# Patient Record
Sex: Female | Born: 1960 | Race: Black or African American | Hispanic: No | Smoking: Never smoker
Health system: Southern US, Community
[De-identification: ages and names within clinical notes are randomized; demographics above are authoritative.]

## PROBLEM LIST (undated history)

## (undated) DIAGNOSIS — M1990 Unspecified osteoarthritis, unspecified site: Secondary | ICD-10-CM

## (undated) DIAGNOSIS — E119 Type 2 diabetes mellitus without complications: Secondary | ICD-10-CM

## (undated) DIAGNOSIS — I519 Heart disease, unspecified: Secondary | ICD-10-CM

## (undated) DIAGNOSIS — G4730 Sleep apnea, unspecified: Secondary | ICD-10-CM

## (undated) DIAGNOSIS — D219 Benign neoplasm of connective and other soft tissue, unspecified: Secondary | ICD-10-CM

## (undated) DIAGNOSIS — I10 Essential (primary) hypertension: Secondary | ICD-10-CM

## (undated) HISTORY — DX: Essential (primary) hypertension: I10

## (undated) HISTORY — DX: Heart disease, unspecified: I51.9

## (undated) HISTORY — DX: Type 2 diabetes mellitus without complications: E11.9

---

## 2000-12-23 ENCOUNTER — Emergency Department (HOSPITAL_COMMUNITY): Admission: EM | Admit: 2000-12-23 | Discharge: 2000-12-23 | Payer: Self-pay | Admitting: Emergency Medicine

## 2003-01-13 ENCOUNTER — Encounter
Admission: RE | Admit: 2003-01-13 | Discharge: 2003-01-13 | Payer: Self-pay | Admitting: Physical Medicine and Rehabilitation

## 2004-09-16 ENCOUNTER — Other Ambulatory Visit: Admission: RE | Admit: 2004-09-16 | Discharge: 2004-09-16 | Payer: Self-pay | Admitting: Obstetrics and Gynecology

## 2004-09-24 ENCOUNTER — Ambulatory Visit (HOSPITAL_COMMUNITY): Admission: RE | Admit: 2004-09-24 | Discharge: 2004-09-24 | Payer: Self-pay | Admitting: Obstetrics and Gynecology

## 2006-11-23 ENCOUNTER — Emergency Department (HOSPITAL_COMMUNITY): Admission: EM | Admit: 2006-11-23 | Discharge: 2006-11-23 | Payer: Self-pay | Admitting: Emergency Medicine

## 2010-02-22 ENCOUNTER — Encounter: Payer: Self-pay | Admitting: Obstetrics and Gynecology

## 2013-07-26 ENCOUNTER — Emergency Department (INDEPENDENT_AMBULATORY_CARE_PROVIDER_SITE_OTHER)
Admission: EM | Admit: 2013-07-26 | Discharge: 2013-07-26 | Disposition: A | Discharge status: Home / Self Care | Payer: BC Managed Care – PPO | Source: Home / Self Care | Attending: Family Medicine | Admitting: Family Medicine

## 2013-07-26 ENCOUNTER — Encounter (HOSPITAL_COMMUNITY): Payer: Self-pay | Admitting: Emergency Medicine

## 2013-07-26 DIAGNOSIS — A084 Viral intestinal infection, unspecified: Secondary | ICD-10-CM

## 2013-07-26 DIAGNOSIS — A088 Other specified intestinal infections: Secondary | ICD-10-CM

## 2013-07-26 MED ORDER — ONDANSETRON HCL 8 MG PO TABS: 8.0000 mg | ORAL_TABLET | Freq: Three times a day (TID) | ORAL | Status: DC | PRN

## 2013-07-26 MED ORDER — ONDANSETRON 4 MG PO TBDP
8.0000 mg | ORAL_TABLET | Freq: Once | ORAL | Status: AC
  Administered 2013-07-26: 8 mg via ORAL

## 2013-07-26 MED ORDER — ONDANSETRON 4 MG PO TBDP
ORAL_TABLET | ORAL | Status: AC
  Filled 2013-07-26: qty 2

## 2013-07-26 NOTE — ED Provider Notes (Signed)
Danielle Mullins is a 53 y.o. female who presents to Urgent Care today for nausea vomiting diarrhea present for one day. Diarrhea is improving. No blood in the stool or vomiting. No abdominal pain. No fevers chills chest pains or palpitations. No medications tried yet.   History reviewed. No pertinent past medical history. History  Substance Use Topics  . Smoking status: Not on file  . Smokeless tobacco: Not on file  . Alcohol Use: Not on file   ROS as above Medications: No current facility-administered medications for this encounter.   Current Outpatient Prescriptions  Medication Sig Dispense Refill  . ondansetron (ZOFRAN) 8 MG tablet Take 1 tablet (8 mg total) by mouth every 8 (eight) hours as needed for nausea or vomiting.  20 tablet  0    Exam:  BP 124/84  Pulse 78  Temp(Src) 98.5 F (36.9 C) (Oral)  Resp 18  SpO2 99% Gen: Well NAD HEENT: EOMI,  MMM Lungs: Normal work of breathing. CTABL Heart: RRR no MRG Abd: NABS, Soft. NT, ND no rebound or guarding Exts: Brisk capillary refill, warm and well perfused.   Patient was given 8 mg of oral Zofran and felt better  No results found for this or any previous visit (from the past 24 hour(s)). No results found.  Assessment and Plan: 54 y.o. female with viral gastroenteritis. Plan to treat with Zofran. Watchful waiting. Followup as needed.  Discussed warning signs or symptoms. Please see discharge instructions. Patient expresses understanding.    Gregor Hams, MD 07/26/13 2101

## 2013-07-26 NOTE — Discharge Instructions (Signed)
Thank you for coming in today. If your belly pain worsens, or you have high fever, bad vomiting, blood in your stool or black tarry stool go to the Emergency Room.   Viral Gastroenteritis Viral gastroenteritis is also known as stomach flu. This condition affects the stomach and intestinal tract. It can cause sudden diarrhea and vomiting. The illness typically lasts 3 to 8 days. Most people develop an immune response that eventually gets rid of the virus. While this natural response develops, the virus can make you quite ill. CAUSES  Many different viruses can cause gastroenteritis, such as rotavirus or noroviruses. You can catch one of these viruses by consuming contaminated food or water. You may also catch a virus by sharing utensils or other personal items with an infected person or by touching a contaminated surface. SYMPTOMS  The most common symptoms are diarrhea and vomiting. These problems can cause a severe loss of body fluids (dehydration) and a body salt (electrolyte) imbalance. Other symptoms may include:  Fever.  Headache.  Fatigue.  Abdominal pain. DIAGNOSIS  Your caregiver can usually diagnose viral gastroenteritis based on your symptoms and a physical exam. A stool sample may also be taken to test for the presence of viruses or other infections. TREATMENT  This illness typically goes away on its own. Treatments are aimed at rehydration. The most serious cases of viral gastroenteritis involve vomiting so severely that you are not able to keep fluids down. In these cases, fluids must be given through an intravenous line (IV). HOME CARE INSTRUCTIONS   Drink enough fluids to keep your urine clear or pale yellow. Drink small amounts of fluids frequently and increase the amounts as tolerated.  Ask your caregiver for specific rehydration instructions.  Avoid:  Foods high in sugar.  Alcohol.  Carbonated drinks.  Tobacco.  Juice.  Caffeine drinks.  Extremely hot or cold  fluids.  Fatty, greasy foods.  Too much intake of anything at one time.  Dairy products until 24 to 48 hours after diarrhea stops.  You may consume probiotics. Probiotics are active cultures of beneficial bacteria. They may lessen the amount and number of diarrheal stools in adults. Probiotics can be found in yogurt with active cultures and in supplements.  Wash your hands well to avoid spreading the virus.  Only take over-the-counter or prescription medicines for pain, discomfort, or fever as directed by your caregiver. Do not give aspirin to children. Antidiarrheal medicines are not recommended.  Ask your caregiver if you should continue to take your regular prescribed and over-the-counter medicines.  Keep all follow-up appointments as directed by your caregiver. SEEK IMMEDIATE MEDICAL CARE IF:   You are unable to keep fluids down.  You do not urinate at least once every 6 to 8 hours.  You develop shortness of breath.  You notice blood in your stool or vomit. This may look like coffee grounds.  You have abdominal pain that increases or is concentrated in one small area (localized).  You have persistent vomiting or diarrhea.  You have a fever.  The patient is a child younger than 3 months, and he or she has a fever.  The patient is a child older than 3 months, and he or she has a fever and persistent symptoms.  The patient is a child older than 3 months, and he or she has a fever and symptoms suddenly get worse.  The patient is a baby, and he or she has no tears when crying. MAKE SURE YOU:     Understand these instructions.  Will watch your condition.  Will get help right away if you are not doing well or get worse. Document Released: 01/18/2005 Document Revised: 04/12/2011 Document Reviewed: 11/04/2010 Exit[employer] Patient Information 2015 [employer]. This information is not intended to replace advice given to you by your health care provider. Make sure you discuss  any questions you have with your health care provider.  

## 2013-07-26 NOTE — ED Notes (Signed)
C/o vomiting and diarrhea which started Wednesday 2 am States has vomit 5 x today

## 2015-01-07 ENCOUNTER — Ambulatory Visit (INDEPENDENT_AMBULATORY_CARE_PROVIDER_SITE_OTHER): Payer: BLUE CROSS/BLUE SHIELD | Admitting: Family Medicine

## 2015-01-07 VITALS — BP 148/86 | HR 72 | Temp 98.9°F | Resp 16 | Ht 64.5 in | Wt 251.6 lb

## 2015-01-07 DIAGNOSIS — M62838 Other muscle spasm: Secondary | ICD-10-CM

## 2015-01-07 DIAGNOSIS — M6248 Contracture of muscle, other site: Secondary | ICD-10-CM | POA: Diagnosis not present

## 2015-01-07 MED ORDER — CYCLOBENZAPRINE HCL 5 MG PO TABS: ORAL_TABLET | ORAL | Status: DC
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
## 2015-01-07 NOTE — Patient Instructions (Signed)
You likely have a pinched nerve that has caused some muscle spasm in your neck and upper shoulder area.  Heat or ice to affected area as needed. Thermacare heat wrap as needed during your workday as needed.  Range of motion/gentle stretches during the day (including at work). Cyclobenzaprine (muscle relaxant) up to 3 times per day as needed, but start at bedtime as this can cause sedation. Over the counter ibuprofen or naproxen if needed short term. If not improving in next 10-14 days - return for recheck and likely xrays. Return to the clinic or go to the nearest emergency room if any of your symptoms worsen or new symptoms occur.  Muscle Cramps and Spasms Muscle cramps and spasms occur when a muscle or muscles tighten and you have no control over this tightening (involuntary muscle contraction). They are a common problem and can develop in any muscle. The most common place is in the calf muscles of the leg. Both muscle cramps and muscle spasms are involuntary muscle contractions, but they also have differences:   Muscle cramps are sporadic and painful. They may last a few seconds to a quarter of an hour. Muscle cramps are often more forceful and last longer than muscle spasms.  Muscle spasms may or may not be painful. They may also last just a few seconds or much longer. CAUSES  It is uncommon for cramps or spasms to be due to a serious underlying problem. In many cases, the cause of cramps or spasms is unknown. Some common causes are:   Overexertion.   Overuse from repetitive motions (doing the same thing over and over).   Remaining in a certain position for a long period of time.   Improper preparation, form, or technique while performing a sport or activity.   Dehydration.   Injury.   Side effects of some medicines.   Abnormally low levels of the salts and ions in your blood (electrolytes), especially potassium and calcium. This could happen if you are taking water pills  (diuretics) or you are pregnant.  Some underlying medical problems can make it more likely to develop cramps or spasms. These include, but are not limited to:   Diabetes.   Parkinson disease.   Hormone disorders, such as thyroid problems.   Alcohol abuse.   Diseases specific to muscles, joints, and bones.   Blood vessel disease where not enough blood is getting to the muscles.  HOME CARE INSTRUCTIONS   Stay well hydrated. Drink enough water and fluids to keep your urine clear or pale yellow.  It may be helpful to massage, stretch, and relax the affected muscle.  For tight or tense muscles, use a warm towel, heating pad, or hot shower water directed to the affected area.  If you are sore or have pain after a cramp or spasm, applying ice to the affected area may relieve discomfort.  Put ice in a plastic bag.  Place a towel between your skin and the bag.  Leave the ice on for 15-20 minutes, 03-04 times a day.  Medicines used to treat a known cause of cramps or spasms may help reduce their frequency or severity. Only take over-the-counter or prescription medicines as directed by your caregiver. SEEK MEDICAL CARE IF:  Your cramps or spasms get more severe, more frequent, or do not improve over time.  MAKE SURE YOU:   Understand these instructions.  Will watch your condition.  Will get help right away if you are not doing well or get  worse.   This information is not intended to replace advice given to you by your health care provider. Make sure you discuss any questions you have with your health care provider.   Document Released: 07/10/2001 Document Revised: 05/15/2012 Document Reviewed: 01/05/2012 Elsevier Interactive Patient Education [employer].

## 2015-01-13 ENCOUNTER — Encounter: Payer: Self-pay | Admitting: Podiatry

## 2015-01-13 ENCOUNTER — Ambulatory Visit (INDEPENDENT_AMBULATORY_CARE_PROVIDER_SITE_OTHER): Payer: BLUE CROSS/BLUE SHIELD | Admitting: Podiatry

## 2015-01-13 ENCOUNTER — Ambulatory Visit (INDEPENDENT_AMBULATORY_CARE_PROVIDER_SITE_OTHER): Payer: BLUE CROSS/BLUE SHIELD

## 2015-01-13 DIAGNOSIS — M2580 Other specified joint disorders, unspecified joint: Secondary | ICD-10-CM | POA: Diagnosis not present

## 2015-01-13 DIAGNOSIS — R52 Pain, unspecified: Secondary | ICD-10-CM

## 2015-01-13 DIAGNOSIS — Q828 Other specified congenital malformations of skin: Secondary | ICD-10-CM

## 2015-01-13 DIAGNOSIS — L600 Ingrowing nail: Secondary | ICD-10-CM

## 2015-01-13 DIAGNOSIS — M722 Plantar fascial fibromatosis: Secondary | ICD-10-CM

## 2015-01-13 HISTORY — DX: Other specified joint disorders, unspecified joint: M25.80

## 2015-01-13 HISTORY — DX: Other specified congenital malformations of skin: Q82.8

## 2015-01-13 HISTORY — DX: Plantar fascial fibromatosis: M72.2

## 2015-01-13 HISTORY — DX: Ingrowing nail: L60.0

## 2015-01-13 MED ORDER — DICLOFENAC SODIUM 75 MG PO TBEC: 75.0000 mg | DELAYED_RELEASE_TABLET | Freq: Two times a day (BID) | ORAL | Status: DC

## 2015-01-13 NOTE — Progress Notes (Signed)
Subjective:    Patient ID: Danielle Mullins, female    DOB: 1960/03/13, 54 y.o.   MRN: [id number]  HPI  54 year old female presents the office with multiple concerns. She states that when she first any blemishes with the appointment for burning to her feet that has resolved. She states that she has been having pain to the ball the right foot for which she points to submetatarsal one which has been ongoing for approximate 4 months. She denies any recent injury or trauma. Denies any swelling or redness over the area. She denies any numbness or tingling. She also states that intimately she does get pain to her left heel but she is going on heavy pain at this time. She has been doing heel after standing for prolonged periods of time. She has has concern of an ingrown toenail of the left big toe as the nail starting to grow inward which become painful at times. Denies any surrounding redness or drainage. Lastly she has a callus of the bottom of her right midfoot which is been there for several years but did not cause any pain, swelling, redness or drainage. She is diabetic and states her blood sugars typically run between 83 with 119. Denies any numbness or tingling. No history of ulceration. No claudication symptoms. No other complaints at this time. She has been taking approximately seems to help.   Review of Systems  All other systems reviewed and are negative.      Objective:   Physical Exam General: AAO x3, NAD  Dermatological: Skin is warm, dry and supple bilateral. There is evidence of incurvation along both the medial lateral aspect of the left hallux toenail in the toenail appears to be somewhat thickened discolored. There is no tenderness palpation of the nails insertion on erythema or drainage. On the right plantar medial midfoot there does appear to be a small hyperkeratotic lesion. Upon debridement the area is superficial, annular and appear to be a porokeratosis. There are no open sores,  no preulcerative lesions, no rash or signs of infection present.  Vascular: Dorsalis Pedis artery and Posterior Tibial artery pedal pulses are 2/4 bilateral with immedate capillary fill time. Pedal hair growth present. No varicosities and no lower extremity edema present bilateral. There is no pain with calf compression, swelling, warmth, erythema.   Neruologic: Grossly intact via light touch bilateral. Vibratory intact via tuning fork bilateral. Protective threshold with Semmes Wienstein monofilament intact to all pedal sites bilateral. Patellar and Achilles deep tendon reflexes 2+ bilateral. No Babinski or clonus noted bilateral.   Musculoskeletal: There is atrophy of the fat pad within the ball the foot and there is prominent metatarsal heads plantarly. At this time there is no tenderness to palpation on the plantar medial tubercle of the calcaneus or the insertion of the plantar fascial bilaterally and there is no pain lateral compression of the calcaneus. The patella course as insertion of the Achilles tendon. There is tenderness up his on submetatarsal one on the right foot however there is no pain the vibratory sensation. There is mpj range of motion. There is no amount edema, erythema, increase in warmth. No other areas of tenderness to bilateral lower extremities. Gait: Unassisted, Nonantalgic.      Assessment & Plan:  54 year old female with right foot sesamoiditis, likely left plantar fasciitis, left ingrown toenail, right porokeratosis -Treatment options discussed including all alternatives, risks, and complications -X-rays were obtained and reviewed with the patient.  -Etiology of symptoms were discussed -In regards  to the plantar fasciitis discussed stretching exercises, shoe gear modifications, icing, naproxen to continue with. Discussed steroid injection but she wishes to hold off. -For the sesamoiditis on the right foot submetatarsal one pain discussed with her steroid injection but  wishes to hold off. Offloading pads were dispensed. I discussed the orthotics and shoe gear changes. -Ingrown toenails debrided without, complications/bleeding. Discussed over-the-counter fungi nail to help with toenail fungus. Continue to monitor for any reoccurrence. Monitor for signs or symptoms of infection. If symptoms persist many partial nail avulsion. -Porokeratosis debrided without, complications/bleeding. -Follow-up in 6 weeks or sooner if any problems arise. In the meantime, encouraged to call the office with any questions, concerns, change in symptoms.   Celesta Gentile, DPM

## 2015-01-13 NOTE — Patient Instructions (Addendum)
Achilles Tendinitis  with Rehab Achilles tendinitis is a disorder of the Achilles tendon. The Achilles tendon connects the large calf muscles (Gastrocnemius and Soleus) to the heel bone (calcaneus). This tendon is sometimes called the heel cord. It is important for pushing-off and standing on your toes and is important for walking, running, or jumping. Tendinitis is often caused by overuse and repetitive microtrauma. SYMPTOMS  Pain, tenderness, swelling, warmth, and redness may occur over the Achilles tendon even at rest.  Pain with pushing off, or flexing or extending the ankle.  Pain that is worsened after or during activity. CAUSES   Overuse sometimes seen with rapid increase in exercise programs or in sports requiring running and jumping.  Poor physical conditioning (strength and flexibility or endurance).  Running sports, especially training running down hills.  Inadequate warm-up before practice or play or failure to stretch before participation.  Injury to the tendon. PREVENTION   Warm up and stretch before practice or competition.  Allow time for adequate rest and recovery between practices and competition.  Keep up conditioning.  Keep up ankle and leg flexibility.  Improve or keep muscle strength and endurance.  Improve cardiovascular fitness.  Use proper technique.  Use proper equipment (shoes, skates).  To help prevent recurrence, taping, protective strapping, or an adhesive bandage may be recommended for several weeks after healing is complete. PROGNOSIS   Recovery may take weeks to several months to heal.  Longer recovery is expected if symptoms have been prolonged.  Recovery is usually quicker if the inflammation is due to a direct blow as compared with overuse or sudden strain. RELATED COMPLICATIONS   Healing time will be prolonged if the condition is not correctly treated. The injury must be given plenty of time to heal.  Symptoms can reoccur if  activity is resumed too soon.  Untreated, tendinitis may increase the risk of tendon rupture requiring additional time for recovery and possibly surgery. TREATMENT   The first treatment consists of rest anti-inflammatory medication, and ice to relieve the pain.  Stretching and strengthening exercises after resolution of pain will likely help reduce the risk of recurrence. Referral to a physical therapist or athletic trainer for further evaluation and treatment may be helpful.  A walking boot or cast may be recommended to rest the Achilles tendon. This can help break the cycle of inflammation and microtrauma.  Arch supports (orthotics) may be prescribed or recommended by your caregiver as an adjunct to therapy and rest.  Surgery to remove the inflamed tendon lining or degenerated tendon tissue is rarely necessary and has shown less than predictable results. MEDICATION   Nonsteroidal anti-inflammatory medications, such as aspirin and ibuprofen, may be used for pain and inflammation relief. Do not take within 7 days before surgery. Take these as directed by your caregiver. Contact your caregiver immediately if any bleeding, stomach upset, or signs of allergic reaction occur. Other minor pain relievers, such as acetaminophen, may also be used.  Pain relievers may be prescribed as necessary by your caregiver. Do not take prescription pain medication for longer than 4 to 7 days. Use only as directed and only as much as you need.  Cortisone injections are rarely indicated. Cortisone injections may weaken tendons and predispose to rupture. It is better to give the condition more time to heal than to use them. HEAT AND COLD  Cold is used to relieve pain and reduce inflammation for acute and chronic Achilles tendinitis. Cold should be applied for 10 to 15 minutes   every 2 to 3 hours for inflammation and pain and immediately after any activity that aggravates your symptoms. Use ice packs or an ice  massage.  Heat may be used before performing stretching and strengthening activities prescribed by your caregiver. Use a heat pack or a warm soak. SEEK MEDICAL CARE IF:  Symptoms get worse or do not improve in 2 weeks despite treatment.  New, unexplained symptoms develop. Drugs used in treatment may produce side effects.  EXERCISES:  RANGE OF MOTION (ROM) AND STRETCHING EXERCISES - Achilles Tendinitis  These exercises may help you when beginning to rehabilitate your injury. Your symptoms may resolve with or without further involvement from your physician, physical therapist or athletic trainer. While completing these exercises, remember:   Restoring tissue flexibility helps normal motion to return to the joints. This allows healthier, less painful movement and activity.  An effective stretch should be held for at least 30 seconds.  A stretch should never be painful. You should only feel a gentle lengthening or release in the stretched tissue.  STRETCH  Gastroc, Standing   Place hands on wall.  Extend right / left leg, keeping the front knee somewhat bent.  Slightly point your toes inward on your back foot.  Keeping your right / left heel on the floor and your knee straight, shift your weight toward the wall, not allowing your back to arch.  You should feel a gentle stretch in the right / left calf. Hold this position for 10 seconds. Repeat 3 times. Complete this stretch 2 times per day.  STRETCH  Soleus, Standing   Place hands on wall.  Extend right / left leg, keeping the other knee somewhat bent.  Slightly point your toes inward on your back foot.  Keep your right / left heel on the floor, bend your back knee, and slightly shift your weight over the back leg so that you feel a gentle stretch deep in your back calf.  Hold this position for 10 seconds. Repeat 3 times. Complete this stretch 2 times per day.  STRETCH  Gastrocsoleus, Standing  Note: This exercise can place  a lot of stress on your foot and ankle. Please complete this exercise only if specifically instructed by your caregiver.   Place the ball of your right / left foot on a step, keeping your other foot firmly on the same step.  Hold on to the wall or a rail for balance.  Slowly lift your other foot, allowing your body weight to press your heel down over the edge of the step.  You should feel a stretch in your right / left calf.  Hold this position for 10 seconds.  Repeat this exercise with a slight bend in your knee. Repeat 3 times. Complete this stretch 2 times per day.   STRENGTHENING EXERCISES - Achilles Tendinitis These exercises may help you when beginning to rehabilitate your injury. They may resolve your symptoms with or without further involvement from your physician, physical therapist or athletic trainer. While completing these exercises, remember:   Muscles can gain both the endurance and the strength needed for everyday activities through controlled exercises.  Complete these exercises as instructed by your physician, physical therapist or athletic trainer. Progress the resistance and repetitions only as guided.  You may experience muscle soreness or fatigue, but the pain or discomfort you are trying to eliminate should never worsen during these exercises. If this pain does worsen, stop and make certain you are following the directions exactly. If   the pain is still present after adjustments, discontinue the exercise until you can discuss the trouble with your clinician.  STRENGTH - Plantar-flexors   Sit with your right / left leg extended. Holding onto both ends of a rubber exercise band/tubing, loop it around the ball of your foot. Keep a slight tension in the band.  Slowly push your toes away from you, pointing them downward.  Hold this position for 10 seconds. Return slowly, controlling the tension in the band/tubing. Repeat 3 times. Complete this exercise 2 times per day.     STRENGTH - Plantar-flexors   Stand with your feet shoulder width apart. Steady yourself with a wall or table using as little support as needed.  Keeping your weight evenly spread over the width of your feet, rise up on your toes.*  Hold this position for 10 seconds. Repeat 3 times. Complete this exercise 2 times per day.  *If this is too easy, shift your weight toward your right / left leg until you feel challenged. Ultimately, you may be asked to do this exercise with your right / left foot only.  STRENGTH  Plantar-flexors, Eccentric  Note: This exercise can place a lot of stress on your foot and ankle. Please complete this exercise only if specifically instructed by your caregiver.   Place the balls of your feet on a step. With your hands, use only enough support from a wall or rail to keep your balance.  Keep your knees straight and rise up on your toes.  Slowly shift your weight entirely to your right / left toes and pick up your opposite foot. Gently and with controlled movement, lower your weight through your right / left foot so that your heel drops below the level of the step. You will feel a slight stretch in the back of your calf at the end position.  Use the healthy leg to help rise up onto the balls of both feet, then lower weight only on the right / left leg again. Build up to 15 repetitions. Then progress to 3 consecutive sets of 15 repetitions.*  After completing the above exercise, complete the same exercise with a slight knee bend (about 30 degrees). Again, build up to 15 repetitions. Then progress to 3 consecutive sets of 15 repetitions.* Perform this exercise 2 times per day.  *When you easily complete 3 sets of 15, your physician, physical therapist or athletic trainer may advise you to add resistance by wearing a backpack filled with additional weight.  STRENGTH - Plantar Flexors, Seated   Sit on a chair that allows your feet to rest flat on the ground. If  necessary, sit at the edge of the chair.  Keeping your toes firmly on the ground, lift your right / left heel as far as you can without increasing any discomfort in your ankle. Repeat 3 times. Complete this exercise 2 times a day.    Plantar Fasciitis (Heel Spur Syndrome) with Rehab The plantar fascia is a fibrous, ligament-like, soft-tissue structure that spans the bottom of the foot. Plantar fasciitis is a condition that causes pain in the foot due to inflammation of the tissue. SYMPTOMS   Pain and tenderness on the underneath side of the foot.  Pain that worsens with standing or walking. CAUSES  Plantar fasciitis is caused by irritation and injury to the plantar fascia on the underneath side of the foot. Common mechanisms of injury include:  Direct trauma to bottom of the foot.  Damage to a small   nerve that runs under the foot where the main fascia attaches to the heel bone.  Stress placed on the plantar fascia due to bone spurs. RISK INCREASES WITH:   Activities that place stress on the plantar fascia (running, jumping, pivoting, or cutting).  Poor strength and flexibility.  Improperly fitted shoes.  Tight calf muscles.  Flat feet.  Failure to warm-up properly before activity.  Obesity. PREVENTION  Warm up and stretch properly before activity.  Allow for adequate recovery between workouts.  Maintain physical fitness:  Strength, flexibility, and endurance.  Cardiovascular fitness.  Maintain a health body weight.  Avoid stress on the plantar fascia.  Wear properly fitted shoes, including arch supports for individuals who have flat feet.  PROGNOSIS  If treated properly, then the symptoms of plantar fasciitis usually resolve without surgery. However, occasionally surgery is necessary.  RELATED COMPLICATIONS   Recurrent symptoms that may result in a chronic condition.  Problems of the lower back that are caused by compensating for the injury, such as  limping.  Pain or weakness of the foot during push-off following surgery.  Chronic inflammation, scarring, and partial or complete fascia tear, occurring more often from repeated injections.  TREATMENT  Treatment initially involves the use of ice and medication to help reduce pain and inflammation. The use of strengthening and stretching exercises may help reduce pain with activity, especially stretches of the Achilles tendon. These exercises may be performed at home or with a therapist. Your caregiver may recommend that you use heel cups of arch supports to help reduce stress on the plantar fascia. Occasionally, corticosteroid injections are given to reduce inflammation. If symptoms persist for greater than 6 months despite non-surgical (conservative), then surgery may be recommended.   MEDICATION   If pain medication is necessary, then nonsteroidal anti-inflammatory medications, such as aspirin and ibuprofen, or other minor pain relievers, such as acetaminophen, are often recommended.  Do not take pain medication within 7 days before surgery.  Prescription pain relievers may be given if deemed necessary by your caregiver. Use only as directed and only as much as you need.  Corticosteroid injections may be given by your caregiver. These injections should be reserved for the most serious cases, because they may only be given a certain number of times.  HEAT AND COLD  Cold treatment (icing) relieves pain and reduces inflammation. Cold treatment should be applied for 10 to 15 minutes every 2 to 3 hours for inflammation and pain and immediately after any activity that aggravates your symptoms. Use ice packs or massage the area with a piece of ice (ice massage).  Heat treatment may be used prior to performing the stretching and strengthening activities prescribed by your caregiver, physical therapist, or athletic trainer. Use a heat pack or soak the injury in warm water.  SEEK IMMEDIATE MEDICAL  CARE IF:  Treatment seems to offer no benefit, or the condition worsens.  Any medications produce adverse side effects.  EXERCISES- RANGE OF MOTION (ROM) AND STRETCHING EXERCISES - Plantar Fasciitis (Heel Spur Syndrome) These exercises may help you when beginning to rehabilitate your injury. Your symptoms may resolve with or without further involvement from your physician, physical therapist or athletic trainer. While completing these exercises, remember:   Restoring tissue flexibility helps normal motion to return to the joints. This allows healthier, less painful movement and activity.  An effective stretch should be held for at least 30 seconds.  A stretch should never be painful. You should only feel a gentle lengthening   or release in the stretched tissue.  RANGE OF MOTION - Toe Extension, Flexion  Sit with your right / left leg crossed over your opposite knee.  Grasp your toes and gently pull them back toward the top of your foot. You should feel a stretch on the bottom of your toes and/or foot.  Hold this stretch for 10 seconds.  Now, gently pull your toes toward the bottom of your foot. You should feel a stretch on the top of your toes and or foot.  Hold this stretch for 10 seconds. Repeat  times. Complete this stretch 3 times per day.   RANGE OF MOTION - Ankle Dorsiflexion, Active Assisted  Remove shoes and sit on a chair that is preferably not on a carpeted surface.  Place right / left foot under knee. Extend your opposite leg for support.  Keeping your heel down, slide your right / left foot back toward the chair until you feel a stretch at your ankle or calf. If you do not feel a stretch, slide your bottom forward to the edge of the chair, while still keeping your heel down.  Hold this stretch for 10 seconds. Repeat 3 times. Complete this stretch 2 times per day.   STRETCH  Gastroc, Standing  Place hands on wall.  Extend right / left leg, keeping the front knee  somewhat bent.  Slightly point your toes inward on your back foot.  Keeping your right / left heel on the floor and your knee straight, shift your weight toward the wall, not allowing your back to arch.  You should feel a gentle stretch in the right / left calf. Hold this position for 10 seconds. Repeat 3 times. Complete this stretch 2 times per day.  STRETCH  Soleus, Standing  Place hands on wall.  Extend right / left leg, keeping the other knee somewhat bent.  Slightly point your toes inward on your back foot.  Keep your right / left heel on the floor, bend your back knee, and slightly shift your weight over the back leg so that you feel a gentle stretch deep in your back calf.  Hold this position for 10 seconds. Repeat 3 times. Complete this stretch 2 times per day.  STRETCH  Gastrocsoleus, Standing  Note: This exercise can place a lot of stress on your foot and ankle. Please complete this exercise only if specifically instructed by your caregiver.   Place the ball of your right / left foot on a step, keeping your other foot firmly on the same step.  Hold on to the wall or a rail for balance.  Slowly lift your other foot, allowing your body weight to press your heel down over the edge of the step.  You should feel a stretch in your right / left calf.  Hold this position for 10 seconds.  Repeat this exercise with a slight bend in your right / left knee. Repeat 3 times. Complete this stretch 2 times per day.   STRENGTHENING EXERCISES - Plantar Fasciitis (Heel Spur Syndrome)  These exercises may help you when beginning to rehabilitate your injury. They may resolve your symptoms with or without further involvement from your physician, physical therapist or athletic trainer. While completing these exercises, remember:   Muscles can gain both the endurance and the strength needed for everyday activities through controlled exercises.  Complete these exercises as instructed by  your physician, physical therapist or athletic trainer. Progress the resistance and repetitions only as guided.  STRENGTH -   Towel Curls  Sit in a chair positioned on a non-carpeted surface.  Place your foot on a towel, keeping your heel on the floor.  Pull the towel toward your heel by only curling your toes. Keep your heel on the floor. Repeat 3 times. Complete this exercise 2 times per day.  STRENGTH - Ankle Inversion  Secure one end of a rubber exercise band/tubing to a fixed object (table, pole). Loop the other end around your foot just before your toes.  Place your fists between your knees. This will focus your strengthening at your ankle.  Slowly, pull your big toe up and in, making sure the band/tubing is positioned to resist the entire motion.  Hold this position for 10 seconds.  Have your muscles resist the band/tubing as it slowly pulls your foot back to the starting position. Repeat 3 times. Complete this exercises 2 times per day.  Document Released: 01/18/2005 Document Revised: 04/12/2011 Document Reviewed: 05/02/2008 [employer] Patient Information 2014 [employer]  

## 2015-04-08 ENCOUNTER — Other Ambulatory Visit: Payer: Self-pay | Admitting: Obstetrics

## 2015-04-08 ENCOUNTER — Ambulatory Visit (INDEPENDENT_AMBULATORY_CARE_PROVIDER_SITE_OTHER): Payer: BLUE CROSS/BLUE SHIELD | Admitting: Obstetrics

## 2015-04-08 ENCOUNTER — Encounter: Payer: Self-pay | Admitting: Obstetrics

## 2015-04-08 VITALS — BP 118/70 | HR 59 | Temp 98.1°F | Wt 241.0 lb

## 2015-04-08 DIAGNOSIS — Z01419 Encounter for gynecological examination (general) (routine) without abnormal findings: Secondary | ICD-10-CM | POA: Diagnosis not present

## 2015-04-08 DIAGNOSIS — D259 Leiomyoma of uterus, unspecified: Secondary | ICD-10-CM

## 2015-04-08 DIAGNOSIS — Z780 Asymptomatic menopausal state: Secondary | ICD-10-CM

## 2015-04-08 DIAGNOSIS — Z1239 Encounter for other screening for malignant neoplasm of breast: Secondary | ICD-10-CM

## 2015-04-08 DIAGNOSIS — Z1231 Encounter for screening mammogram for malignant neoplasm of breast: Secondary | ICD-10-CM

## 2015-04-08 NOTE — Progress Notes (Signed)
Subjective:        Danielle Mullins is a 55 y.o. female here for a routine exam.  Current complaints: none.    Personal health questionnaire:  Is patient Ashkenazi Jewish, have a family history of breast and/or ovarian cancer: no Is there a family history of uterine cancer diagnosed at age < 24, gastrointestinal cancer, urinary tract cancer, family member who is a Field seismologist syndrome-associated carrier: no Is the patient overweight and hypertensive, family history of diabetes, personal history of gestational diabetes, preeclampsia or PCOS: yes Is patient over 22, have PCOS,  family history of premature CHD under age 27, diabetes, smoke, have hypertension or peripheral artery disease:  no At any time, has a partner hit, kicked or otherwise hurt or frightened you?: no Over the past 2 weeks, have you felt down, depressed or hopeless?: no Over the past 2 weeks, have you felt little interest or pleasure in doing things?:no   Gynecologic History No LMP recorded. Patient is postmenopausal.  LMP ~5 years ago. Contraception: post menopausal status Last Pap: ~10 years ago. Results were: normal Last mammogram: ~10 years ago. Results were: normal  Obstetric History OB History  No data available    Past Medical History  Diagnosis Date  . Diabetes mellitus without complication ([place])   . Hypertension     History reviewed. No pertinent past surgical history.   Current outpatient prescriptions:  .  diclofenac (VOLTAREN) 75 MG EC tablet, Take 1 tablet (75 mg total) by mouth 2 (two) times daily., Disp: 30 tablet, Rfl: 2 .  lisinopril (PRINIVIL,ZESTRIL) 10 MG tablet, Take 10 mg by mouth daily., Disp: , Rfl:  .  metFORMIN (GLUCOPHAGE) 500 MG tablet, Take by mouth 3 (three) times daily with meals., Disp: , Rfl:  No Known Allergies  Social History  Substance Use Topics  . Smoking status: Never Smoker   . Smokeless tobacco: Never Used  . Alcohol Use: No    Family History  Problem Relation Age of  Onset  . Diabetes Mother   . Hypertension Mother   . Heart disease Father   . Hypertension Father   . Diabetes Brother       Review of Systems  Constitutional: negative for fatigue and weight loss Respiratory: negative for cough and wheezing Cardiovascular: negative for chest pain, fatigue and palpitations Gastrointestinal: negative for abdominal pain and change in bowel habits Musculoskeletal:negative for myalgias Neurological: negative for gait problems and tremors Behavioral/Psych: negative for abusive relationship, depression Endocrine: negative for temperature intolerance   Genitourinary:negative for abnormal menstrual periods, genital lesions, hot flashes, sexual problems and vaginal discharge Integument/breast: negative for breast lump, breast tenderness, nipple discharge and skin lesion(s)    Objective:       BP 118/70 mmHg  Pulse 59  Temp(Src) 98.1 F (36.7 C)  Wt 241 lb (109.317 kg) General:   alert  Skin:   no rash or abnormalities  Lungs:   clear to auscultation bilaterally  Heart:   regular rate and rhythm, S1, S2 normal, no murmur, click, rub or gallop  Breasts:   normal without suspicious masses, skin or nipple changes or axillary nodes  Abdomen:  normal findings: no organomegaly, soft, non-tender and no hernia  Pelvis:  External genitalia: normal general appearance Urinary system: urethral meatus normal and bladder without fullness, nontender Vaginal: normal without tenderness, induration or masses.  Mucosa atrophic and bled with speculum manipulation Cervix: normal appearance.  Retroflexed.  Difficult to get optimal assessment of anatomy due to retroflexion  Adnexa: difficult assessment due to obesity Uterus: difficult assessment due to obesity   Lab Review Urine pregnancy test Labs reviewed yes Radiologic studies reviewed yes    Assessment:    Healthy female exam.   Difficult exam technically due to obesity.    H/O uterine fibroids  Morbid  obesity  Type 2 Diabetes, on Metformin, stable  Postmenopausal.  Clinically stable.  Hypertension.  Stable, on Lisinopril.   Plan:    Ultrasound ordered  Mammogram ordered  Bone Density Study ordered  F/U in 2 weeks  No orders of the defined types were placed in this encounter.   Orders Placed This Encounter  Procedures  . US Transvaginal Non-OB    Standing Status: Future     Number of Occurrences:      Standing Expiration Date: 06/07/2016    Order Specific Question:  Reason for Exam (SYMPTOM  OR DIAGNOSIS REQUIRED)    Answer:  postmenopausal.  H/O fibroids.    Order Specific Question:  Preferred imaging location?    Answer:  [facility]  . US Pelvis Complete    Standing Status: Future     Number of Occurrences:      Standing Expiration Date: 06/07/2016    Order Specific Question:  Reason for Exam (SYMPTOM  OR DIAGNOSIS REQUIRED)    Answer:  postmenopause.  H/O fibroids.    Order Specific Question:  Preferred imaging location?    Answer:  [facility]  . MM Digital Screening    Standing Status: Future     Number of Occurrences:      Standing Expiration Date: 06/07/2016    Scheduling Instructions:     Postmenopausal    Order Specific Question:  Reason for Exam (SYMPTOM  OR DIAGNOSIS REQUIRED)    Answer:  screening    Order Specific Question:  Is the patient pregnant?    Answer:  No    Order Specific Question:  Preferred imaging location?    Answer:  [facility]  . DG Bone Density    Standing Status: Future     Number of Occurrences:      Standing Expiration Date: 06/07/2016    Order Specific Question:  Reason for Exam (SYMPTOM  OR DIAGNOSIS REQUIRED)    Answer:  estrogen deficiency    Order Specific Question:  Is the patient pregnant?    Answer:  No    Order Specific Question:  Preferred imaging location?    Answer:  [id number]

## 2015-04-10 LAB — PAP, TP IMAGING W/ HPV RNA, RFLX HPV TYPE 16,18/45: HPV mRNA, High Risk: NOT DETECTED

## 2015-04-11 LAB — SURESWAB, VAGINOSIS/VAGINITIS PLUS
Atopobium vaginae: NOT DETECTED Log (cells/mL)
C. albicans, DNA: NOT DETECTED
C. glabrata, DNA: NOT DETECTED
C. parapsilosis, DNA: NOT DETECTED
C. trachomatis RNA, TMA: NOT DETECTED
C. tropicalis, DNA: NOT DETECTED
Gardnerella vaginalis: NOT DETECTED Log (cells/mL)
LACTOBACILLUS SPECIES: NOT DETECTED Log (cells/mL)
MEGASPHAERA SPECIES: NOT DETECTED Log (cells/mL)
N. gonorrhoeae RNA, TMA: NOT DETECTED
T. vaginalis RNA, QL TMA: DETECTED — AB

## 2015-04-12 ENCOUNTER — Other Ambulatory Visit: Payer: Self-pay | Admitting: Obstetrics

## 2015-04-12 DIAGNOSIS — A5901 Trichomonal vulvovaginitis: Secondary | ICD-10-CM

## 2015-04-12 MED ORDER — TINIDAZOLE 500 MG PO TABS: 2.0000 g | ORAL_TABLET | Freq: Every day | ORAL | Status: DC

## 2015-04-15 ENCOUNTER — Ambulatory Visit (HOSPITAL_COMMUNITY): Payer: Self-pay

## 2015-04-18 ENCOUNTER — Ambulatory Visit (HOSPITAL_COMMUNITY)
Admission: RE | Admit: 2015-04-18 | Discharge: 2015-04-18 | Disposition: A | Discharge status: Home / Self Care | Payer: BLUE CROSS/BLUE SHIELD | Source: Ambulatory Visit | Attending: Obstetrics | Admitting: Obstetrics

## 2015-04-18 DIAGNOSIS — D252 Subserosal leiomyoma of uterus: Secondary | ICD-10-CM | POA: Diagnosis not present

## 2015-04-18 DIAGNOSIS — D251 Intramural leiomyoma of uterus: Secondary | ICD-10-CM | POA: Insufficient documentation

## 2015-04-18 DIAGNOSIS — D259 Leiomyoma of uterus, unspecified: Secondary | ICD-10-CM

## 2015-04-22 ENCOUNTER — Ambulatory Visit: Payer: BLUE CROSS/BLUE SHIELD | Admitting: Obstetrics

## 2015-05-02 ENCOUNTER — Ambulatory Visit
Admission: RE | Admit: 2015-05-02 | Discharge: 2015-05-02 | Disposition: A | Discharge status: Home / Self Care | Payer: BLUE CROSS/BLUE SHIELD | Source: Ambulatory Visit | Attending: Obstetrics | Admitting: Obstetrics

## 2015-05-02 DIAGNOSIS — Z780 Asymptomatic menopausal state: Secondary | ICD-10-CM

## 2015-05-02 DIAGNOSIS — Z1231 Encounter for screening mammogram for malignant neoplasm of breast: Secondary | ICD-10-CM

## 2015-05-13 ENCOUNTER — Ambulatory Visit (INDEPENDENT_AMBULATORY_CARE_PROVIDER_SITE_OTHER): Payer: BLUE CROSS/BLUE SHIELD | Admitting: Obstetrics

## 2015-05-13 VITALS — BP 139/80 | HR 71 | Wt 243.0 lb

## 2015-05-13 DIAGNOSIS — N760 Acute vaginitis: Secondary | ICD-10-CM | POA: Diagnosis not present

## 2015-05-13 DIAGNOSIS — B9689 Other specified bacterial agents as the cause of diseases classified elsewhere: Secondary | ICD-10-CM

## 2015-05-13 DIAGNOSIS — D259 Leiomyoma of uterus, unspecified: Secondary | ICD-10-CM

## 2015-05-13 DIAGNOSIS — A499 Bacterial infection, unspecified: Secondary | ICD-10-CM

## 2015-05-13 DIAGNOSIS — R87615 Unsatisfactory cytologic smear of cervix: Secondary | ICD-10-CM | POA: Diagnosis not present

## 2015-05-13 DIAGNOSIS — A5901 Trichomonal vulvovaginitis: Secondary | ICD-10-CM | POA: Diagnosis not present

## 2015-05-15 ENCOUNTER — Encounter: Payer: Self-pay | Admitting: Obstetrics

## 2015-05-15 NOTE — Progress Notes (Signed)
Consult for results:  H/O uterine fibroids. S:  No complaints. O:  Afebrile  VSS       PE:  Deferred        Ultrasound:  Numerous uterine fibroids: otherwise, normal.              Pap Smear:  Unsatisfactory specimen.  No transformation zone cells.  A:  Stable uterine fibroids        Trichomonas vaginitis       Unsatisfactory pap smear P:  F/U for repeat pap 3 months after treatment for Trichomonas.  May need to do under anesthesia because of patient's inability to tolerate exam.  Clenton Pare. Jodi Mourning MD 05-13-15

## 2015-08-12 ENCOUNTER — Ambulatory Visit (INDEPENDENT_AMBULATORY_CARE_PROVIDER_SITE_OTHER): Payer: BLUE CROSS/BLUE SHIELD | Admitting: Obstetrics

## 2015-08-12 ENCOUNTER — Encounter: Payer: Self-pay | Admitting: Obstetrics

## 2015-08-12 VITALS — BP 134/86 | HR 63 | Temp 98.1°F | Wt 247.0 lb

## 2015-08-12 DIAGNOSIS — A499 Bacterial infection, unspecified: Secondary | ICD-10-CM

## 2015-08-12 DIAGNOSIS — Z8619 Personal history of other infectious and parasitic diseases: Secondary | ICD-10-CM | POA: Diagnosis not present

## 2015-08-12 DIAGNOSIS — Z780 Asymptomatic menopausal state: Secondary | ICD-10-CM

## 2015-08-12 DIAGNOSIS — N760 Acute vaginitis: Secondary | ICD-10-CM

## 2015-08-12 DIAGNOSIS — R87615 Unsatisfactory cytologic smear of cervix: Secondary | ICD-10-CM

## 2015-08-12 DIAGNOSIS — B9689 Other specified bacterial agents as the cause of diseases classified elsewhere: Secondary | ICD-10-CM

## 2015-08-12 DIAGNOSIS — D259 Leiomyoma of uterus, unspecified: Secondary | ICD-10-CM | POA: Diagnosis not present

## 2015-08-12 MED ORDER — METRONIDAZOLE 500 MG PO TABS: 500.0000 mg | ORAL_TABLET | Freq: Two times a day (BID) | ORAL | Status: DC

## 2015-08-12 NOTE — Addendum Note (Signed)
Addended by: Lewie Loron D on: 08/12/2015 01:47 PM   Modules accepted: Orders

## 2015-08-12 NOTE — Progress Notes (Signed)
Patient ID: Danielle Mullins, female   DOB: Aug 17, 1960, 55 y.o.   MRN: [id number]  Chief Complaint  Patient presents with  . Gynecologic Exam    Patient is in the office for a 3 month pap- her last pap was insufficent.    HPI Danielle Mullins is a 55 y.o. female.  H/O difficult vaginal exam and unsatisfactory pap;and positive trichomonas, not optimally treated.  Was to be scheduled for EUA and pap smear in 3 months after treatment for Trichomonas. HPI  Past Medical History  Diagnosis Date  . Diabetes mellitus without complication ([place])   . Hypertension   . Heart disease, unspecified e    enlarged heart chamber- patient has referral    No past surgical history on file.  Family History  Problem Relation Age of Onset  . Diabetes Mother   . Hypertension Mother   . Heart disease Father   . Hypertension Father   . Diabetes Brother     Social History Social History  Substance Use Topics  . Smoking status: Never Smoker   . Smokeless tobacco: Never Used  . Alcohol Use: No    No Known Allergies  Current Outpatient Prescriptions  Medication Sig Dispense Refill  . lisinopril (PRINIVIL,ZESTRIL) 10 MG tablet Take 10 mg by mouth daily.    Marland Kitchen loratadine (CLARITIN) 10 MG tablet Take 10 mg by mouth daily.    . metFORMIN (GLUCOPHAGE) 500 MG tablet Take by mouth 3 (three) times daily with meals.    . metroNIDAZOLE (FLAGYL) 500 MG tablet Take 1 tablet (500 mg total) by mouth 2 (two) times daily. 28 tablet 2   No current facility-administered medications for this visit.    Review of Systems Review of Systems Constitutional: negative for fatigue and weight loss Respiratory: negative for cough and wheezing Cardiovascular: negative for chest pain, fatigue and palpitations Gastrointestinal: negative for abdominal pain and change in bowel habits Genitourinary:negative Integument/breast: negative for nipple discharge Musculoskeletal:negative for myalgias Neurological: negative for gait  problems and tremors Behavioral/Psych: negative for abusive relationship, depression Endocrine: negative for temperature intolerance     Blood pressure 134/86, pulse 63, temperature 98.1 F (36.7 C), weight 247 lb (112.038 kg).  Physical Exam Physical Exam           General:  Alert and no distress Abdomen:  normal findings: no organomegaly, soft, non-tender and no hernia  Pelvis:  External genitalia: normal general appearance Urinary system: urethral meatus normal and bladder without fullness, nontender Vaginal: normal without tenderness, induration or masses Cervix: normal appearance Adnexa: normal bimanual exam Uterus: anteverted and non-tender, normal size      Data Reviewed Labs Ultrasound  Assessment     Trichomonas vaginitis, suboptimally treated ( took Tindamax 1 gram daily x 2 instead of 2 grams at once ) Very difficult speculum exam with posterior cervix  Uterine fibroids, stable Obesity    Plan    Tindamax dispensed - 2 grams with instructions Flagyl Rx for BV  F/U in 3 months for outpatient EUA and Pap Smear.  Patient agrees.  No orders of the defined types were placed in this encounter.   Meds ordered this encounter  Medications  . lisinopril (PRINIVIL,ZESTRIL) 10 MG tablet    Sig: Take 10 mg by mouth daily.  Marland Kitchen DISCONTD: metFORMIN (GLUCOPHAGE) 500 MG tablet    Sig: Take by mouth 2 (two) times daily with a meal.  . loratadine (CLARITIN) 10 MG tablet    Sig: Take 10 mg by mouth daily.  Marland Kitchen  metroNIDAZOLE (FLAGYL) 500 MG tablet    Sig: Take 1 tablet (500 mg total) by mouth 2 (two) times daily.    Dispense:  28 tablet    Refill:  2

## 2015-08-16 LAB — NUSWAB VG, CANDIDA 6SP
Candida albicans, NAA: NEGATIVE
Candida glabrata, NAA: POSITIVE — AB
Candida krusei, NAA: NEGATIVE
Candida lusitaniae, NAA: NEGATIVE
Candida parapsilosis, NAA: NEGATIVE
Candida tropicalis, NAA: NEGATIVE
Trich vag by NAA: NEGATIVE

## 2016-06-18 ENCOUNTER — Ambulatory Visit
Admission: RE | Admit: 2016-06-18 | Discharge: 2016-06-18 | Disposition: A | Discharge status: Home / Self Care | Payer: BLUE CROSS/BLUE SHIELD | Source: Ambulatory Visit | Attending: Family Medicine | Admitting: Family Medicine

## 2016-06-18 ENCOUNTER — Other Ambulatory Visit: Payer: Self-pay | Admitting: Family Medicine

## 2016-06-18 DIAGNOSIS — M545 Low back pain, unspecified: Secondary | ICD-10-CM

## 2017-01-23 ENCOUNTER — Ambulatory Visit (HOSPITAL_COMMUNITY)
Admission: EM | Admit: 2017-01-23 | Discharge: 2017-01-23 | Disposition: A | Discharge status: Home / Self Care | Payer: BLUE CROSS/BLUE SHIELD | Attending: Family Medicine | Admitting: Family Medicine

## 2017-01-23 ENCOUNTER — Encounter (HOSPITAL_COMMUNITY): Payer: Self-pay | Admitting: Emergency Medicine

## 2017-01-23 DIAGNOSIS — J40 Bronchitis, not specified as acute or chronic: Secondary | ICD-10-CM | POA: Diagnosis not present

## 2017-01-23 MED ORDER — PREDNISONE 20 MG PO TABS: ORAL_TABLET | ORAL | 0 refills | Status: DC

## 2017-01-23 MED ORDER — HYDROCODONE-HOMATROPINE 5-1.5 MG/5ML PO SYRP: 5.0000 mL | ORAL_SOLUTION | Freq: Four times a day (QID) | ORAL | 0 refills | Status: DC | PRN

## 2017-01-23 MED ORDER — AZITHROMYCIN 250 MG PO TABS: ORAL_TABLET | ORAL | 0 refills | Status: DC

## 2017-01-23 NOTE — ED Triage Notes (Signed)
PT C/O: cold sx associated w/cough that increases at night, weakness, wheezing  ONSET: 10 weeks  DENIES:  fevers  TAKING MEDS: OTC Mucinex   A&O x4... NAD... Ambulatory

## 2017-01-23 NOTE — Discharge Instructions (Signed)
Please return if you are not improving over the next several days.

## 2017-01-23 NOTE — ED Provider Notes (Signed)
[place]   [id number] 01/23/17 Arrival Time: 1202   SUBJECTIVE:  Danielle Mullins is a 56 y.o. female who presents to the urgent care with complaint of cold sx associated w/cough that increases at night, weakness, wheezing for the past 10 weeks, waxing and waning, worse last 2 days to the point where having difficulty sleeping  Works in [employer], educational, same job 2 years.   Past Medical History:  Diagnosis Date  . Diabetes mellitus without complication ([place])   . Heart disease, unspecified e   enlarged heart chamber- patient has referral  . Hypertension    Family History  Problem Relation Age of Onset  . Diabetes Mother   . Hypertension Mother   . Heart disease Father   . Hypertension Father   . Diabetes Brother    Social History   Socioeconomic History  . Marital status: Divorced    Spouse name: Not on file  . Number of children: Not on file  . Years of education: Not on file  . Highest education level: Not on file  Social Needs  . Financial resource strain: Not on file  . Food insecurity - worry: Not on file  . Food insecurity - inability: Not on file  . Transportation needs - medical: Not on file  . Transportation needs - non-medical: Not on file  Occupational History  . Not on file  Tobacco Use  . Smoking status: Never Smoker  . Smokeless tobacco: Never Used  Substance and Sexual Activity  . Alcohol use: No    Alcohol/week: 0.0 oz  . Drug use: No  . Sexual activity: Not Currently  Other Topics Concern  . Not on file  Social History Narrative  . Not on file   Current Meds  Medication Sig  . famotidine (PEPCID) 20 MG tablet Take 20 mg by mouth 2 (two) times daily.  Marland Kitchen gabapentin (NEURONTIN) 100 MG capsule Take 100 mg by mouth 3 (three) times daily.  Marland Kitchen lisinopril (PRINIVIL,ZESTRIL) 10 MG tablet Take 10 mg by mouth daily.  Marland Kitchen loratadine (CLARITIN) 10 MG tablet Take 10 mg by mouth daily.  . metFORMIN (GLUCOPHAGE) 500 MG tablet Take by mouth 3  (three) times daily with meals.  . naproxen (NAPROSYN) 500 MG tablet Take 500 mg by mouth 2 (two) times daily with a meal.  . simvastatin (ZOCOR) 20 MG tablet Take 20 mg by mouth daily.   No Known Allergies    ROS: As per HPI, remainder of ROS negative.   OBJECTIVE:   Vitals:   01/23/17 1220  BP: (!) 139/59  Resp: 18  Temp: 98.4 F (36.9 C)  TempSrc: Oral  SpO2: 100%     General appearance: alert; no distress Eyes: PERRL; EOMI; conjunctiva normal HENT: normocephalic; atraumatic; TMs normal, canal normal, external ears normal without trauma; nasal mucosa normal; oral mucosa normal Neck: supple Lungs: faint exp wheezes to auscultation bilaterally Heart: regular rate and rhythm Back: no CVA tenderness Extremities: no cyanosis or edema; symmetrical with no gross deformities Skin: warm and dry Neurologic: normal gait; grossly normal Psychological: alert and cooperative; normal mood and affect      Labs:  Results for orders placed or performed in visit on 08/12/15  NuSwab VG, Candida 6sp  Result Value Ref Range   Atopobium vaginae Low - 0 Score   BVAB 2 Low - 0 Score   Megasphaera 1 Low - 0 Score   Candida albicans, NAA Negative Negative   Candida glabrata, NAA Positive (A) Negative  Candida tropicalis, NAA Negative Negative   Candida parapsilosis, NAA Negative Negative   Candida lusitaniae, NAA Negative Negative   Candida krusei, NAA Negative Negative   Trich vag by NAA Negative Negative    Labs Reviewed - No data to display  No results found.     ASSESSMENT & PLAN:  1. Bronchitis     Meds ordered this encounter  Medications  . azithromycin (ZITHROMAX) 250 MG tablet    Sig: Take 2 tabs PO x 1 dose, then 1 tab PO QD x 4 days    Dispense:  6 tablet    Refill:  0  . predniSONE (DELTASONE) 20 MG tablet    Sig: Two daily with food    Dispense:  6 tablet    Refill:  0  . HYDROcodone-homatropine (HYDROMET) 5-1.5 MG/5ML syrup    Sig: Take 5 mLs by  mouth every 6 (six) hours as needed for cough.    Dispense:  60 mL    Refill:  0    Reviewed expectations re: course of current medical issues. Questions answered. Outlined signs and symptoms indicating need for more acute intervention. Patient verbalized understanding. After Visit Summary given.    Procedures:      Robyn Haber, MD 01/23/17 1238

## 2018-01-24 ENCOUNTER — Encounter (HOSPITAL_COMMUNITY): Payer: Self-pay | Admitting: Emergency Medicine

## 2018-01-24 ENCOUNTER — Ambulatory Visit (INDEPENDENT_AMBULATORY_CARE_PROVIDER_SITE_OTHER): Payer: BLUE CROSS/BLUE SHIELD

## 2018-01-24 ENCOUNTER — Ambulatory Visit (HOSPITAL_COMMUNITY)
Admission: EM | Admit: 2018-01-24 | Discharge: 2018-01-24 | Disposition: A | Discharge status: Home / Self Care | Payer: BLUE CROSS/BLUE SHIELD | Attending: Family Medicine | Admitting: Family Medicine

## 2018-01-24 ENCOUNTER — Other Ambulatory Visit: Payer: Self-pay

## 2018-01-24 DIAGNOSIS — M545 Low back pain, unspecified: Secondary | ICD-10-CM

## 2018-01-24 DIAGNOSIS — M5412 Radiculopathy, cervical region: Secondary | ICD-10-CM | POA: Diagnosis not present

## 2018-01-24 DIAGNOSIS — M542 Cervicalgia: Secondary | ICD-10-CM | POA: Diagnosis not present

## 2018-01-24 MED ORDER — TIZANIDINE HCL 4 MG PO TABS: 4.0000 mg | ORAL_TABLET | Freq: Four times a day (QID) | ORAL | 0 refills | Status: DC | PRN

## 2018-01-24 MED ORDER — METHYLPREDNISOLONE 4 MG PO TBPK: ORAL_TABLET | ORAL | 0 refills | Status: DC
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
## 2018-01-24 NOTE — Discharge Instructions (Addendum)
The right arm pain is likely a pinched nerve Other pains are strained muscles Take the medrol pak as instructed This is an anti inflammatory medicine Take all of day one today Take the muscle relaxer as needed This is useful at night Use ice or heat to painful areas See your PCP in follow up next week
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
## 2018-06-13 ENCOUNTER — Ambulatory Visit (INDEPENDENT_AMBULATORY_CARE_PROVIDER_SITE_OTHER): Payer: BLUE CROSS/BLUE SHIELD

## 2018-06-13 ENCOUNTER — Ambulatory Visit: Payer: BLUE CROSS/BLUE SHIELD | Admitting: Podiatry

## 2018-06-13 ENCOUNTER — Encounter: Payer: Self-pay | Admitting: Podiatry

## 2018-06-13 ENCOUNTER — Other Ambulatory Visit: Payer: Self-pay | Admitting: Podiatry

## 2018-06-13 ENCOUNTER — Other Ambulatory Visit: Payer: Self-pay

## 2018-06-13 VITALS — Temp 98.1°F

## 2018-06-13 DIAGNOSIS — M6740 Ganglion, unspecified site: Secondary | ICD-10-CM

## 2018-06-14 NOTE — Progress Notes (Signed)
Subjective:   Patient ID: Danielle Mullins, female   DOB: 58 y.o.   MRN: [id number]   HPI 58 year old female presents the office today for concerns of a painful "knot" on the top of the left foot.  This is been ongoing for several years.  She is very painful with pressure.  She states has been getting larger.  Denies any redness or any other areas of swelling.  No recent treatment.  She has no other concerns today.  She states her A1c was around 7   Review of Systems  All other systems reviewed and are negative.  Past Medical History:  Diagnosis Date  . Diabetes mellitus without complication ([place])   . Heart disease, unspecified e   enlarged heart chamber- patient has referral  . Hypertension     History reviewed. No pertinent surgical history.   Current Outpatient Medications:  .  gabapentin (NEURONTIN) 100 MG capsule, Take 100 mg by mouth 3 (three) times daily., Disp: , Rfl:  .  lisinopril (PRINIVIL,ZESTRIL) 10 MG tablet, Take 10 mg by mouth daily., Disp: , Rfl:  .  loratadine (CLARITIN) 10 MG tablet, Take 10 mg by mouth daily., Disp: , Rfl:  .  metFORMIN (GLUCOPHAGE) 500 MG tablet, Take by mouth 3 (three) times daily with meals., Disp: , Rfl:  .  naproxen (NAPROSYN) 500 MG tablet, Take 500 mg by mouth 2 (two) times daily with a meal., Disp: , Rfl:  .  simvastatin (ZOCOR) 20 MG tablet, Take 20 mg by mouth daily., Disp: , Rfl:   No Known Allergies       Objective:  Physical Exam  General: AAO x3, NAD  Dermatological: Skin is warm, dry and supple bilateral. Nails x 10 are well manicured; remaining integument appears unremarkable at this time. There are no open sores, no preulcerative lesions, no rash or signs of infection present.  Vascular: Dorsalis Pedis artery and Posterior Tibial artery pedal pulses are 2/4 bilateral with immedate capillary fill time. Pedal hair growth present. No varicosities and no lower extremity edema present bilateral. There is no pain with calf  compression, swelling, warmth, erythema.   Neruologic: Grossly intact via light touch bilateral. VProtective threshold with Semmes Wienstein monofilament intact to all pedal sites bilateral.   Musculoskeletal: The dorsal lateral aspect of the left foot there is a fluid-filled mobile soft tissue mass consistent with a ganglion cyst.  There is no other areas of tenderness elicited at this time.  No open lesions.  Muscular strength 5/5 in all groups tested bilateral.  Gait: Unassisted, Nonantalgic.      Assessment:   Ganglion cyst left foot  Plan:  -Treatment options discussed including all alternatives, risks, and complications -Etiology of symptoms were discussed -X-rays were obtained and reviewed with the patient.  No evidence of acute fracture or stress fracture or foreign body present. -Discussed conservative as well as surgical treatment options.  This time she was proceed with aspiration of the cyst.  I cleaned the skin with alcohol and a mixture of 3 cc of lidocaine, Marcaine plain was infiltrated and the patient left parasternal soft tissue mass.  The skin was prepped with Betadine.  18-gauge needle was utilized to aspirate clear, jellylike fluid.  After aspiration of the cyst have resolved.  Half cc Kenalog 10, half cc of Marcaine plain was infiltrated into the soft tissue without complications.  Compression bandage was applied.  Post procedure instructions discussed.  Trula Slade DPM

## 2018-06-16 LAB — NON-GYN, SPECIMEN A

## 2018-06-16 LAB — CYTOLOGY - NON PAP

## 2018-06-21 ENCOUNTER — Telehealth: Payer: Self-pay | Admitting: *Deleted

## 2018-06-21 NOTE — Telephone Encounter (Signed)
Left message requesting a call to discuss results. 

## 2018-06-21 NOTE — Telephone Encounter (Signed)
Pt called and I informed of Dr.Wagoner's review of results. Pt states she is fine.

## 2018-06-21 NOTE — Telephone Encounter (Signed)
-----   Message from Trula Slade, DPM sent at 06/21/2018  3:46 PM EDT ----- Val- please let her know that the pathology that I did on the aspiration came back as a benign cyst. Can you see how she is doing when you call? Thanks.

## 2018-08-15 ENCOUNTER — Ambulatory Visit
Admission: RE | Admit: 2018-08-15 | Discharge: 2018-08-15 | Disposition: A | Discharge status: Home / Self Care | Payer: BC Managed Care – PPO | Source: Ambulatory Visit | Attending: Family Medicine | Admitting: Family Medicine

## 2018-08-15 ENCOUNTER — Other Ambulatory Visit: Payer: Self-pay | Admitting: Family Medicine

## 2018-08-15 DIAGNOSIS — M25551 Pain in right hip: Secondary | ICD-10-CM

## 2019-12-04 ENCOUNTER — Ambulatory Visit: Payer: BC Managed Care – PPO | Attending: Internal Medicine

## 2019-12-04 DIAGNOSIS — Z23 Encounter for immunization: Secondary | ICD-10-CM

## 2019-12-04 NOTE — Progress Notes (Signed)
   Covid-19 Vaccination Clinic  Name:  Danielle Mullins    MRN: [id number] DOB: 1960/10/11  12/04/2019  Danielle Mullins was observed post Covid-19 immunization for 15 minutes without incident. She was provided with Vaccine Information Sheet and instruction to access the V-Safe system.   Danielle Mullins was instructed to call [phone] with any severe reactions post vaccine: Marland Kitchen Difficulty breathing  . Swelling of face and throat  . A fast heartbeat  . A bad rash all over body  . Dizziness and weakness

## 2020-03-24 ENCOUNTER — Encounter (HOSPITAL_COMMUNITY): Payer: Self-pay | Admitting: Emergency Medicine

## 2020-03-24 ENCOUNTER — Observation Stay (HOSPITAL_COMMUNITY)
Admission: EM | Admit: 2020-03-24 | Discharge: 2020-03-25 | Disposition: A | Discharge status: Home / Self Care | Payer: BC Managed Care – PPO | Attending: Cardiology | Admitting: Cardiology

## 2020-03-24 ENCOUNTER — Inpatient Hospital Stay (HOSPITAL_COMMUNITY): Payer: BC Managed Care – PPO

## 2020-03-24 ENCOUNTER — Emergency Department (HOSPITAL_COMMUNITY): Payer: BC Managed Care – PPO

## 2020-03-24 ENCOUNTER — Other Ambulatory Visit: Payer: Self-pay

## 2020-03-24 DIAGNOSIS — R001 Bradycardia, unspecified: Secondary | ICD-10-CM | POA: Diagnosis present

## 2020-03-24 DIAGNOSIS — R0600 Dyspnea, unspecified: Secondary | ICD-10-CM | POA: Insufficient documentation

## 2020-03-24 DIAGNOSIS — I441 Atrioventricular block, second degree: Secondary | ICD-10-CM | POA: Diagnosis not present

## 2020-03-24 DIAGNOSIS — R0789 Other chest pain: Principal | ICD-10-CM | POA: Insufficient documentation

## 2020-03-24 DIAGNOSIS — I10 Essential (primary) hypertension: Secondary | ICD-10-CM | POA: Insufficient documentation

## 2020-03-24 DIAGNOSIS — Z7984 Long term (current) use of oral hypoglycemic drugs: Secondary | ICD-10-CM | POA: Diagnosis not present

## 2020-03-24 DIAGNOSIS — Z79899 Other long term (current) drug therapy: Secondary | ICD-10-CM | POA: Diagnosis not present

## 2020-03-24 DIAGNOSIS — Z791 Long term (current) use of non-steroidal anti-inflammatories (NSAID): Secondary | ICD-10-CM | POA: Diagnosis not present

## 2020-03-24 DIAGNOSIS — R0609 Other forms of dyspnea: Secondary | ICD-10-CM | POA: Insufficient documentation

## 2020-03-24 DIAGNOSIS — Z6837 Body mass index (BMI) 37.0-37.9, adult: Secondary | ICD-10-CM | POA: Insufficient documentation

## 2020-03-24 DIAGNOSIS — E119 Type 2 diabetes mellitus without complications: Secondary | ICD-10-CM | POA: Insufficient documentation

## 2020-03-24 DIAGNOSIS — Z20822 Contact with and (suspected) exposure to covid-19: Secondary | ICD-10-CM | POA: Insufficient documentation

## 2020-03-24 DIAGNOSIS — G4733 Obstructive sleep apnea (adult) (pediatric): Secondary | ICD-10-CM | POA: Diagnosis not present

## 2020-03-24 HISTORY — DX: Atrioventricular block, second degree: I44.1

## 2020-03-24 HISTORY — DX: Bradycardia, unspecified: R00.1

## 2020-03-24 LAB — CBC
HCT: 38.6 % (ref 36.0–46.0)
HCT: 33.4 % — ABNORMAL LOW (ref 36.0–46.0)
Hemoglobin: 11.5 g/dL — ABNORMAL LOW (ref 12.0–15.0)
Hemoglobin: 10.7 g/dL — ABNORMAL LOW (ref 12.0–15.0)
MCH: 24.1 pg — ABNORMAL LOW (ref 26.0–34.0)
MCH: 25.1 pg — ABNORMAL LOW (ref 26.0–34.0)
MCHC: 29.8 g/dL — ABNORMAL LOW (ref 30.0–36.0)
MCHC: 32 g/dL (ref 30.0–36.0)
MCV: 80.8 fL (ref 80.0–100.0)
MCV: 78.4 fL — ABNORMAL LOW (ref 80.0–100.0)
Platelets: 413 10*3/uL — ABNORMAL HIGH (ref 150–400)
Platelets: 375 10*3/uL (ref 150–400)
RBC: 4.78 MIL/uL (ref 3.87–5.11)
RBC: 4.26 MIL/uL (ref 3.87–5.11)
RDW: 13.4 % (ref 11.5–15.5)
RDW: 13.5 % (ref 11.5–15.5)
WBC: 3.2 10*3/uL — ABNORMAL LOW (ref 4.0–10.5)
WBC: 4.7 10*3/uL (ref 4.0–10.5)
nRBC: 0 % (ref 0.0–0.2)
nRBC: 0 % (ref 0.0–0.2)

## 2020-03-24 LAB — ECHOCARDIOGRAM COMPLETE
Height: 66 in
S' Lateral: 2.6 cm
Weight: 3680 oz

## 2020-03-24 LAB — LIPID PANEL
Cholesterol: 175 mg/dL (ref 0–200)
HDL: 35 mg/dL — ABNORMAL LOW (ref >40)
LDL Cholesterol: 129 mg/dL — ABNORMAL HIGH (ref 0–99)
Total CHOL/HDL Ratio: 5 RATIO
Triglycerides: 56 mg/dL (ref <150)
VLDL: 11 mg/dL (ref 0–40)

## 2020-03-24 LAB — BASIC METABOLIC PANEL
Anion gap: 10 (ref 5–15)
BUN: 7 mg/dL (ref 6–20)
CO2: 27 mmol/L (ref 22–32)
Calcium: 9 mg/dL (ref 8.9–10.3)
Chloride: 101 mmol/L (ref 98–111)
Creatinine, Ser: 0.86 mg/dL (ref 0.44–1.00)
GFR, Estimated: >60 mL/min (ref >60)
Glucose, Bld: 124 mg/dL — ABNORMAL HIGH (ref 70–99)
Potassium: 3.8 mmol/L (ref 3.5–5.1)
Sodium: 138 mmol/L (ref 135–145)

## 2020-03-24 LAB — TROPONIN I (HIGH SENSITIVITY)
Troponin I (High Sensitivity): 4 ng/L (ref <18)
Troponin I (High Sensitivity): 4 ng/L (ref <18)

## 2020-03-24 LAB — BRAIN NATRIURETIC PEPTIDE: B Natriuretic Peptide: 10.6 pg/mL (ref 0.0–100.0)

## 2020-03-24 LAB — CREATININE, SERUM
Creatinine, Ser: 0.93 mg/dL (ref 0.44–1.00)
GFR, Estimated: >60 mL/min (ref >60)

## 2020-03-24 LAB — SARS CORONAVIRUS 2 (TAT 6-24 HRS): SARS Coronavirus 2: NEGATIVE

## 2020-03-24 LAB — HIV ANTIBODY (ROUTINE TESTING W REFLEX): HIV Screen 4th Generation wRfx: NONREACTIVE

## 2020-03-24 LAB — TSH: TSH: 2.696 u[IU]/mL (ref 0.350–4.500)

## 2020-03-24 MED ORDER — HEPARIN SODIUM (PORCINE) 5000 UNIT/ML IJ SOLN
5000.0000 [IU] | Freq: Three times a day (TID) | INTRAMUSCULAR | Status: DC
  Administered 2020-03-24 – 2020-03-25 (×2): 5000 [IU] via SUBCUTANEOUS
  Filled 2020-03-24 (×2): qty 1

## 2020-03-24 MED ORDER — HYDROCHLOROTHIAZIDE 12.5 MG PO CAPS
12.5000 mg | ORAL_CAPSULE | Freq: Every day | ORAL | Status: DC
  Administered 2020-03-25: 12.5 mg via ORAL
  Filled 2020-03-24: qty 1

## 2020-03-24 MED ORDER — GABAPENTIN 100 MG PO CAPS
100.0000 mg | ORAL_CAPSULE | Freq: Every day | ORAL | Status: DC
  Administered 2020-03-24: 100 mg via ORAL
  Filled 2020-03-24: qty 1

## 2020-03-24 MED ORDER — METFORMIN HCL 500 MG PO TABS
500.0000 mg | ORAL_TABLET | Freq: Three times a day (TID) | ORAL | Status: DC
  Administered 2020-03-25 (×2): 500 mg via ORAL
  Filled 2020-03-24 (×2): qty 1

## 2020-03-24 MED ORDER — ONDANSETRON HCL 4 MG/2ML IJ SOLN
4.0000 mg | Freq: Once | INTRAMUSCULAR | Status: AC
  Administered 2020-03-24: 4 mg via INTRAVENOUS
  Filled 2020-03-24: qty 2

## 2020-03-24 MED ORDER — SODIUM CHLORIDE 0.9 % IV BOLUS
1000.0000 mL | Freq: Once | INTRAVENOUS | Status: AC
  Administered 2020-03-24: 1000 mL via INTRAVENOUS

## 2020-03-24 MED ORDER — LISINOPRIL 10 MG PO TABS
10.0000 mg | ORAL_TABLET | Freq: Every day | ORAL | Status: DC
  Administered 2020-03-25: 10 mg via ORAL
  Filled 2020-03-24: qty 1

## 2020-03-24 MED ORDER — ONDANSETRON HCL 4 MG/2ML IJ SOLN: 4.0000 mg | Freq: Four times a day (QID) | INTRAMUSCULAR | Status: DC | PRN

## 2020-03-24 MED ORDER — SIMVASTATIN 20 MG PO TABS
40.0000 mg | ORAL_TABLET | Freq: Every day | ORAL | Status: DC
  Administered 2020-03-24: 40 mg via ORAL
  Filled 2020-03-24: qty 2

## 2020-03-24 MED ORDER — ACETAMINOPHEN 325 MG PO TABS: 650.0000 mg | ORAL_TABLET | ORAL | Status: DC | PRN

## 2020-03-24 MED ORDER — MORPHINE SULFATE (PF) 4 MG/ML IV SOLN
4.0000 mg | Freq: Once | INTRAVENOUS | Status: AC
  Administered 2020-03-24: 4 mg via INTRAVENOUS
  Filled 2020-03-24: qty 1

## 2020-03-24 NOTE — ED Triage Notes (Signed)
Pt arrives to ED with c/o substernal constant chest pain since last night. Pt describes the pain as pressure. Has had CP on and off for past month, but now its constant. Pt states now gets dyspneic on exertion.

## 2020-03-24 NOTE — H&P (Signed)
CARDIOLOGY ADMIT NOTE   Patient ID: Danielle Mullins MRN: [id number] DOB/AGE: 04-10-1960 60 y.o.  Admit date: 03/24/2020 Primary Physician:  Iona Beard, MD  Patient ID: Danielle Mullins, female    DOB: April 24, 1960, 60 y.o.   MRN: [id number]  Chief Complaint  Patient presents with  . Chest Pain   Dyspnea on exertion  HPI:    Danielle Mullins  is a 60 y.o. African-American female with hypertension, hyperlipidemia, diabetes mellitus, obstructive sleep apnea presently not using CPAP, morbid obesity with BMI of 41 in 2018 and has lost about 30 to 40 pounds over the past 3 years, had been doing well except for chronic dyspnea on exertion ongoing for the past 1 year.  She has had chronic dyspnea prior to that but gradually worse.  She was seen by her PCP today when she presented with chest pain and dyspnea and EKG in the office was abnormal and hence patient was advised to go to the emergency room.  Patient states that her diabetes has been well controlled and blood pressure has been well controlled and she has been compliant with medications.  She denies any dizziness or syncope, leg edema, PND or orthopnea.  Chest pain is described as sharp pain in the middle of the chest noted at any time during the day mostly during rest for routine activity.  No exertional component.  Past Medical History:  Diagnosis Date  . Diabetes mellitus without complication ([place])   . Heart disease, unspecified e   enlarged heart chamber- patient has referral  . Hypertension    History reviewed. No pertinent surgical history. Social History   Socioeconomic History  . Marital status: Divorced    Spouse name: Not on file  . Number of children: Not on file  . Years of education: Not on file  . Highest education level: Not on file  Occupational History  . Not on file  Tobacco Use  . Smoking status: Never Smoker  . Smokeless tobacco: Never Used  Substance and Sexual Activity  . Alcohol use: No    Alcohol/week:  0.0 standard drinks  . Drug use: No  . Sexual activity: Not Currently  Other Topics Concern  . Not on file  Social History Narrative  . Not on file   Social Determinants of Health   Financial Resource Strain: Not on file  Food Insecurity: Not on file  Transportation Needs: Not on file  Physical Activity: Not on file  Stress: Not on file  Social Connections: Not on file  Intimate Partner Violence: Not on file   Family History  Problem Relation Age of Onset  . Diabetes Mother   . Hypertension Mother   . Heart disease Father   . Hypertension Father   . Diabetes Brother     ROS  Review of Systems  Cardiovascular: Positive for chest pain and dyspnea on exertion. Negative for leg swelling.  Respiratory: Positive for snoring (improved since weight loss).   Musculoskeletal: Negative.   Gastrointestinal: Negative.  Negative for melena.  Psychiatric/Behavioral: Negative.   All other systems reviewed and are negative.  Objective   Vitals with BMI 03/24/2020 03/24/2020 03/24/2020  Height - - -  Weight - - -  BMI - - -  Systolic 073 710 626  Diastolic 70 64 71  Pulse 83 70 85      Physical Exam Vitals reviewed.  Constitutional:      Appearance: She is obese.  HENT:     Head: Normocephalic.  Eyes:     Extraocular Movements: Extraocular movements intact.  Cardiovascular:     Rate and Rhythm: Normal rate and regular rhythm.     Pulses: Intact distal pulses.     Heart sounds: Normal heart sounds. No murmur heard. No gallop.      Comments: No leg edema, no JVD. Pulmonary:     Effort: Pulmonary effort is normal.     Breath sounds: Normal breath sounds.  Abdominal:     General: Bowel sounds are normal.     Palpations: Abdomen is soft.  Musculoskeletal:        General: No swelling. Normal range of motion.     Cervical back: Normal range of motion.  Skin:    Capillary Refill: Capillary refill takes less than 2 seconds.  Neurological:     General: No focal deficit  present.     Mental Status: She is alert and oriented to person, place, and time.  Psychiatric:        Mood and Affect: Mood normal.    Laboratory examination:   Recent Labs    03/24/20 1133  NA 138  K 3.8  CL 101  CO2 27  GLUCOSE 124*  BUN 7  CREATININE 0.86  CALCIUM 9.0  GFRNONAA >60   estimated creatinine clearance is 84.9 mL/min (by C-G formula based on SCr of 0.86 mg/dL).  CMP Latest Ref Rng & Units 03/24/2020  Glucose 70 - 99 mg/dL 124(H)  BUN 6 - 20 mg/dL 7  Creatinine 0.44 - 1.00 mg/dL 0.86  Sodium 135 - 145 mmol/L 138  Potassium 3.5 - 5.1 mmol/L 3.8  Chloride 98 - 111 mmol/L 101  CO2 22 - 32 mmol/L 27  Calcium 8.9 - 10.3 mg/dL 9.0   CBC Latest Ref Rng & Units 03/24/2020  WBC 4.0 - 10.5 K/uL 3.2(L)  Hemoglobin 12.0 - 15.0 g/dL 11.5(L)  Hematocrit 36.0 - 46.0 % 38.6  Platelets 150 - 400 K/uL 413(H)   Lipid Panel  No results found for: CHOL, TRIG, HDL, CHOLHDL, VLDL, LDLCALC, LDLDIRECT HEMOGLOBIN A1C No results found for: HGBA1C, MPG TSH No results for input(s): TSH in the last 8760 hours. BNP (last 3 results) Recent Labs    03/24/20 1236  BNP 10.6    Medications and allergies  No Known Allergies  No current facility-administered medications on file prior to encounter.   Current Outpatient Medications on File Prior to Encounter  Medication Sig Dispense Refill  . gabapentin (NEURONTIN) 100 MG capsule Take 100 mg by mouth at bedtime.    Marland Kitchen lisinopril-hydrochlorothiazide (ZESTORETIC) 10-12.5 MG tablet Take 1 tablet by mouth daily.    Marland Kitchen loratadine (CLARITIN) 10 MG tablet Take 10 mg by mouth as needed for allergies.    . metFORMIN (GLUCOPHAGE) 500 MG tablet Take 500 mg by mouth 3 (three) times daily with meals.    . naproxen (NAPROSYN) 500 MG tablet Take 500 mg by mouth daily.    . simvastatin (ZOCOR) 40 MG tablet Take 40 mg by mouth at bedtime.     No intake/output data recorded. Total I/O In: 1000 [IV Piggyback:1000] Out: -     Radiology:    DG  Chest 2 View Result Date: 03/24/2020 CLINICAL DATA:  Chest pain. EXAM: CHEST - 2 VIEW COMPARISON:  None. FINDINGS: The heart size and mediastinal contours are within normal limits. Both lungs are clear. No pneumothorax or pleural effusion is noted. The visualized skeletal structures are unremarkable. IMPRESSION: No active cardiopulmonary disease. Electronically Signed  By: Marijo Conception M.D.   On: 03/24/2020 11:56   Cardiac Studies:   Exercise sestamibi stress test 08/29/2015: 1. The resting electrocardiogram demonstrated normal sinus rhythm and normal resting conduction. Poor R wave progression. Stress EKG is negative for ischemia, there were non specific ST-T abnormalities. Patient exercised on Bruce protocol for 5.00 minutes and achieved 5.76 METS. Stress test terminated due to 85% MPHR achieved (Target HR >85%). Symptoms included dizziness and fatigue. Normal BP response. 2. The perfusion imaging study demonstrates prominent breast attenuation artifact in the inferior wall (scanned sitting) without any demonstrable ischemia. Left ventricular systolic function Calculated by QGS was 52% . Clincal correlation recommended in a patient with BMI 40. This is a low risk study.  Echocardiogram 03/24/2020:   Patient appears to be in Mobitz Ii AV block. Left ventricular ejection fraction, by estimation, is 60 to 65%. The left ventricle has normal function. The left ventricle has no regional wall motion abnormalities. Indeterminate diastolic filling due to E-A fusion.Right ventricular systolic function is normal.  The right ventricular size is normal. The mitral valve is normal in structure. No evidence of mitral valve regurgitation. No evidence of mitral stenosis. The aortic valve is normal in structure. Aortic valve regurgitation is not visualized. No aortic stenosis is present. The inferior vena cava is normal in size with <50% respiratory variability, suggesting right atrial pressure of 8 mmHg. No  significant change from 09/16/2015.  EKG:   EKG 03/24/2020: Underlying sinus rhythm with Mobitz 2 AV block, 3-1 block.  Left axis deviation.  Cannot exclude lateral infarct old.  Marked LVH.   Assessment   1.  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 2.  Dyspnea on exertion and reduced exercise capacity probably related to deconditioning, obesity, ongoing for the past 1 year, no recent change. 3.  Musculoskeletal chest pain, mild tenderness at the inferior margin of the sternum.  Doubt angina pectoris. 4.  Primary hypertension, well controlled 5.  Diabetes mellitus type 2 controlled per patient without hypoglycemia 6.  Moderate obesity, history of OSA presently not on CPAP, patient previously was morbidly obese in 2017 with a BMI of 41 and has been gradually losing weight.  She has stopped using CPAP as she felt better.  Recommendations:   I made the patient walk in the room with telemetry on board.  She was able to mount heart rate response to around 95 bpm with sinus and one-to-one conduction without heart block.  However immediately upon resting there was recurrence of Mobitz 2 AV block.  Extremely complex presentation, patient is not on any negative inotropic agents.  Question is whether her symptoms of dyspnea and reduced exercise capacity is related to Mobitz 2 AV block or deconditioning is extremely difficult to state as she has not had any recent EKG and she only presented with chest pain that led to an EKG being performed today.  I had last seen her almost 4 years ago.  However in view of high degree AV block, would certainly want EP evaluation.  Echocardiogram is essentially unremarkable and unchanged from 2017.  Clinical suspicion for pulmonary embolism is very low in the absence of leg edema, pain in her legs, and low suspicion clinically.  High sensitive troponin x1 - for myocardial  injury.  Otherwise labs within normal limits.  TSH has been ordered.   Adrian Prows, MD, [facility] 03/24/2020, 6:54 PM Office:  [phone] Pager: [phone]

## 2020-03-24 NOTE — Progress Notes (Signed)
  Echocardiogram 2D Echocardiogram has been performed.  Danielle Mullins 03/24/2020, 5:54 PM

## 2020-03-24 NOTE — Medical Student Note (Signed)
[place] DEPT Provider Student Note For educational purposes for Medical, PA and NP students only and not part of the legal medical record.   CSN: [id number] Arrival date & time: 03/24/20  1115      History   Chief Complaint Chief Complaint  Patient presents with  . Chest Pain    HPI Danielle Mullins is a 60 y.o. female with PMHx hypertension and T2DM who presents to ED with chest pain.   States that she has had month long, intermittent, chest pressure. States she was at primary care visit today, when she had the chest pain again. States that because it felt like it was shooting across her scapular her PCP sent her to the ED. Describes CP as tight pressure in center of chest that is usually non-radiating. At times it feels like central back pain. She cannot identify triggers of chest pain. States at time has been in bed, other times walking around. Nothing makes the pain better. States pain can last up to 20-30 minutes. She does not attempt medication or rest to alleviate pain. Pain aborts on its own. Denies nausea, diaphoresis, jaw pain or radiation down the arm. Denies palpitations, but states at times she feels like it is slow. She has seen cardiology three years ago. Believes she was on carvedilol and nitroglycerin at that time. Does not take either now.   Also complains of short of breath. States even rolling over in bed can cause her to become symptomatic. This has been going on longer than the chest pain. States that it has not worsened over the past month. She sleeps on two pillows which is not new. Denies PND. Denies cough or fevers. No recent travel history, immobilization or surgery. Does not smoke, no exogenous estrogen use. No history of DVT/PE or family history of same. She does not wear oxygen of NIV at home.   Past Medical History:  Diagnosis Date  . Diabetes mellitus without complication ([place])   . Heart disease, unspecified e   enlarged heart chamber- patient has  referral  . Hypertension     Patient Active Problem List   Diagnosis Date Noted  . 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 Plantar fasciitis 01/13/2015  . Sesamoiditis 01/13/2015  . Ingrown toenail 01/13/2015    History reviewed. No pertinent surgical history.  OB History   No obstetric history on file.     Home Medications    Prior to Admission medications   Medication Sig Start Date End Date Taking? Authorizing Provider  gabapentin (NEURONTIN) 100 MG capsule Take 100 mg by mouth 3 (three) times daily.    [provider]  lisinopril (PRINIVIL,ZESTRIL) 10 MG tablet Take 10 mg by mouth daily.    [provider]  loratadine (CLARITIN) 10 MG tablet Take 10 mg by mouth daily.    [provider]  metFORMIN (GLUCOPHAGE) 500 MG tablet Take by mouth 3 (three) times daily with meals.    [provider]  naproxen (NAPROSYN) 500 MG tablet Take 500 mg by mouth 2 (two) times daily with a meal.    [provider]  simvastatin (ZOCOR) 20 MG tablet Take 20 mg by mouth daily.    [provider]    Family History Family History  Problem Relation Age of Onset  . Diabetes Mother   . Hypertension Mother   . Heart disease Father   . Hypertension Father   . Diabetes Brother     Social History Social History   Tobacco  Use  . Smoking status: Never Smoker  . Smokeless tobacco: Never Used  Substance Use Topics  . Alcohol use: No    Alcohol/week: 0.0 standard drinks  . Drug use: No     Allergies   Patient has no known allergies.   Review of Systems Review of Systems  Constitutional: Negative for diaphoresis and fever.  HENT: Negative.   Eyes: Negative.   Respiratory: Positive for chest tightness and shortness of breath. Negative for cough.   Cardiovascular: Positive for chest pain. Negative for leg swelling.  Gastrointestinal: Negative.  Negative for nausea and vomiting.  Endocrine: Negative.   Genitourinary: Negative.    Musculoskeletal: Negative.   Skin: Negative.   Allergic/Immunologic: Negative.   Neurological: Negative.   Hematological: Negative.   Psychiatric/Behavioral: Negative.    Physical Exam Updated Vital Signs BP (!) 135/57 (BP Location: Left Arm)   Pulse 85   Temp 98.6 F (37 C) (Oral)   Resp 20   Ht 5\' 6"  (1.676 m)   Wt 104.3 kg   SpO2 100%   BMI 37.12 kg/m   Physical Exam Vitals and nursing note reviewed. Exam conducted with a chaperone present.  Constitutional:      General: She is not in acute distress.    Appearance: She is not diaphoretic.  HENT:     Head: Normocephalic.  Eyes:     Extraocular Movements: Extraocular movements intact.     Pupils: Pupils are equal, round, and reactive to light.  Cardiovascular:     Rate and Rhythm: Normal rate. Rhythm irregular.     Pulses: Normal pulses.          Radial pulses are 2+ on the right side and 2+ on the left side.       Dorsalis pedis pulses are 2+ on the right side and 2+ on the left side.     Heart sounds: Normal heart sounds.  Pulmonary:     Effort: Pulmonary effort is normal.     Breath sounds: Normal breath sounds.  Chest:     Chest wall: No tenderness.  Abdominal:     General: Bowel sounds are normal.     Palpations: Abdomen is soft.  Musculoskeletal:        General: Normal range of motion.     Cervical back: Neck supple.     Right lower leg: No edema.     Left lower leg: No edema.  Skin:    General: Skin is warm and dry.     Capillary Refill: Capillary refill takes less than 2 seconds.  Neurological:     General: No focal deficit present.     Mental Status: She is alert.    ED Treatments / Results  Labs (all labs ordered are listed, but only abnormal results are displayed) Labs Reviewed  BASIC METABOLIC PANEL - Abnormal; Notable for the following components:      Result Value   Glucose, Bld 124 (*)    All other components within normal limits  CBC - Abnormal; Notable for the following components:    WBC 3.2 (*)    Hemoglobin 11.5 (*)    MCH 24.1 (*)    MCHC 29.8 (*)    Platelets 413 (*)    All other components within normal limits  SARS CORONAVIRUS 2 (TAT 6-24 HRS)  BRAIN NATRIURETIC PEPTIDE  TROPONIN I (HIGH SENSITIVITY)  TROPONIN I (HIGH SENSITIVITY)   EKG 2nd degree mobitz type II AVB.   Radiology DG Chest 2  View  Result Date: 03/24/2020 CLINICAL DATA:  Chest pain. EXAM: CHEST - 2 VIEW COMPARISON:  None. FINDINGS: The heart size and mediastinal contours are within normal limits. Both lungs are clear. No pneumothorax or pleural effusion is noted. The visualized skeletal structures are unremarkable. IMPRESSION: No active cardiopulmonary disease. Electronically Signed   By: Marijo Conception M.D.   On: 03/24/2020 11:56   Procedures Procedures (including critical care time)  Medications Ordered in ED Medications  ondansetron (ZOFRAN) injection 4 mg (4 mg Intravenous Given 03/24/20 1307)  morphine 4 MG/ML injection 4 mg (4 mg Intravenous Given 03/24/20 1307)  sodium chloride 0.9 % bolus 1,000 mL (0 mLs Intravenous Stopped 03/24/20 1416)   Initial Impression / Assessment and Plan / ED Course  I have reviewed the triage vital signs and the nursing notes.  Pertinent labs & imaging results that were available during my care of the patient were reviewed by me and considered in my medical decision making (see chart for details).  Keyera Hattabaugh is a 63yoF with PMH/HPI as stated above. Differential includes ACS, PE, new onset heart failure, viral pneumonia.   Unlikely PE. She has no risk factors other than age. Physical exam is unremarkable for PE. Wells score 0.  Unlikely pneumonia or infectious etiology as she has no fevers, cough. WBC wnl. CXR without pulmonary disease. Not STEMI - EKG with Mobitz II AVB. She is in an out of block back to NSR while assessing her.  Troponin x2 negative BNP 10.   Vitals charted as HR in 30s. On monitor she has not dropped below 40. She is  perfusing with rhythm. She is HDS. No indication for atropine or pacing. Continues to feel as though her heart is slowing and intermittent chest tightness so will admit for symptomatic bradycardia.   Final Clinical Impressions(s) / ED Diagnoses   Final diagnoses:  2nd degree AV block  Symptomatic bradycardia    New Prescriptions New Prescriptions   No medications on file

## 2020-03-24 NOTE — ED Provider Notes (Signed)
  Face-to-face evaluation   History: She presents for evaluation of chest discomfort, described as "tightness." Symptoms ongoing for 1 week. She has additionally noticed that her heart rate feels slower recently. She denies nausea, vomiting, weakness or dizziness. She has not had any syncope. She has not taken cardiac rate controlling medication. No prior similar problems. There are no other known modifying factors.  Physical exam: Elderly, alert and cooperative. Regular pulse palpated right wrist. Patient appears comfortable. She is not confused.  Medical screening examination/treatment/procedure(s) were conducted as a shared visit with non-physician practitioner(s) and myself.  I personally evaluated the patient during the encounter    Daleen Bo, MD 03/26/20 0900

## 2020-03-24 NOTE — ED Provider Notes (Signed)
[facility] Provider Note   CSN: [id number] Arrival date & time: 03/24/20  1115     History Chief Complaint  Patient presents with  . Chest Pain    Danielle Mullins is a 60 y.o. female with PMH/o DM, HTN, presents for evaluation of midsternal chest pain that has been ongoing intermittently for about a month.  She states that she has had it occurred randomly for the last month.  She states that this chest pain is not tied to any particular action does not notice that is provoked by exertion.  She states she sometimes had a vomiting event.  She states occasionally she will get hot with the pain but does not have any nausea/vomiting.  She states that the pain will last for about 20 minutes and then will resolve on its own. She occasionally will have some lightheadedness. She does not take any medications.  She does not feel like it is improved by rest.  She also reports she has had some dyspnea on exertion.  She states this is not necessarily new for her she has had this previously.  She finds that if she turns her bed, she breathes hard or if she is walking up the stairs around her house.  Just feeling the last month.  She states that sometimes she feels like her heart is very slow.  She sleeps on 2 pillows at night and states that this is baseline for her.  No increase.  She states she has seen cardiology doctor about 3 years ago but she has not remember which 1 and states that she was told that her heart was beating too hard and was given some medication and some nitro.  She states that she got better and she has not had the nitro since then.  She went to her primary care doctor today and told him about her symptoms and he prompted her to come to the emergency department.  She has a history of hypertension diabetes and states that her blood pressure is controlled.  She does not smoke. She denies any OCP use, recent immobilization, prior history of DVT/PE, recent  surgery, leg swelling, or long travel.  Denies any fevers, cough, abdominal pain, vomiting, leg swelling.   The history is provided by the patient.       Past Medical History:  Diagnosis Date  . Diabetes mellitus without complication ([place])   . Heart disease, unspecified e   enlarged heart chamber- patient has referral  . Hypertension     Patient Active Problem List   Diagnosis Date Noted  . Symptomatic bradycardia 03/24/2020  . 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 Plantar fasciitis 01/13/2015  . Sesamoiditis 01/13/2015  . Ingrown toenail 01/13/2015    History reviewed. No pertinent surgical history.   OB History   No obstetric history on file.     Family History  Problem Relation Age of Onset  . Diabetes Mother   . Hypertension Mother   . Heart disease Father   . Hypertension Father   . Diabetes Brother     Social History   Tobacco Use  . Smoking status: Never Smoker  . Smokeless tobacco: Never Used  Substance Use Topics  . Alcohol use: No    Alcohol/week: 0.0 standard drinks  . Drug use: No    Home Medications Prior to Admission medications   Medication Sig Start Date End Date Taking? Authorizing Provider  gabapentin (NEURONTIN) 100 MG capsule Take 100 mg by mouth  at bedtime.   Yes [provider]  lisinopril-hydrochlorothiazide (ZESTORETIC) 10-12.5 MG tablet Take 1 tablet by mouth daily. 03/13/20  Yes [provider]  loratadine (CLARITIN) 10 MG tablet Take 10 mg by mouth as needed for allergies.   Yes [provider]  metFORMIN (GLUCOPHAGE) 500 MG tablet Take 500 mg by mouth 3 (three) times daily with meals.   Yes [provider]  naproxen (NAPROSYN) 500 MG tablet Take 500 mg by mouth daily.   Yes [provider]  simvastatin (ZOCOR) 40 MG tablet Take 40 mg by mouth at bedtime. 03/14/20  Yes [provider]    Allergies    Patient has no known allergies.  Review of Systems   Review of Systems   Constitutional: Negative for fever.  Respiratory: Positive for shortness of breath. Negative for cough.   Cardiovascular: Positive for chest pain.  Gastrointestinal: Negative for abdominal pain, nausea and vomiting.  Genitourinary: Negative for dysuria and hematuria.  Neurological: Negative for headaches.  All other systems reviewed and are negative.   Physical Exam Updated Vital Signs BP 120/75   Pulse 80   Temp 98.6 F (37 C) (Oral)   Resp 14   Ht 5\' 6"  (1.676 m)   Wt 104.3 kg   SpO2 97%   BMI 37.12 kg/m   Physical Exam Vitals and nursing note reviewed.  Constitutional:      Appearance: Normal appearance. She is well-developed and well-nourished.  HENT:     Head: Normocephalic and atraumatic.     Mouth/Throat:     Mouth: Oropharynx is clear and moist and mucous membranes are normal.  Eyes:     General: Lids are normal.     Extraocular Movements: EOM normal.     Conjunctiva/sclera: Conjunctivae normal.     Pupils: Pupils are equal, round, and reactive to light.  Cardiovascular:     Rate and Rhythm: Normal rate and regular rhythm.     Pulses: Normal pulses.          Radial pulses are 2+ on the right side and 2+ on the left side.       Dorsalis pedis pulses are 2+ on the right side and 2+ on the left side.     Heart sounds: Normal heart sounds. No murmur heard. No friction rub. No gallop.   Pulmonary:     Effort: Pulmonary effort is normal.     Breath sounds: Normal breath sounds.     Comments: Lungs clear to auscultation bilaterally.  Symmetric chest rise.  No wheezing, rales, rhonchi. Abdominal:     Palpations: Abdomen is soft. Abdomen is not rigid.     Tenderness: There is no abdominal tenderness. There is no guarding.  Musculoskeletal:        General: Normal range of motion.     Cervical back: Full passive range of motion without pain.  Skin:    General: Skin is warm and dry.     Capillary Refill: Capillary refill takes less than 2 seconds.  Neurological:      Mental Status: She is alert and oriented to person, place, and time.  Psychiatric:        Mood and Affect: Mood and affect normal.        Speech: Speech normal.     ED Results / Procedures / Treatments   Labs (all labs ordered are listed, but only abnormal results are displayed) Labs Reviewed  BASIC METABOLIC PANEL - Abnormal; Notable for the following components:  Result Value   Glucose, Bld 124 (*)    All other components within normal limits  CBC - Abnormal; Notable for the following components:   WBC 3.2 (*)    Hemoglobin 11.5 (*)    MCH 24.1 (*)    MCHC 29.8 (*)    Platelets 413 (*)    All other components within normal limits  SARS CORONAVIRUS 2 (TAT 6-24 HRS)  BRAIN NATRIURETIC PEPTIDE  TROPONIN I (HIGH SENSITIVITY)  TROPONIN I (HIGH SENSITIVITY)    EKG None 2nd degree AV block (Mobitz II), Rate 57 No priors for comparison.     Radiology DG Chest 2 View  Result Date: 03/24/2020 CLINICAL DATA:  Chest pain. EXAM: CHEST - 2 VIEW COMPARISON:  None. FINDINGS: The heart size and mediastinal contours are within normal limits. Both lungs are clear. No pneumothorax or pleural effusion is noted. The visualized skeletal structures are unremarkable. IMPRESSION: No active cardiopulmonary disease. Electronically Signed   By: Marijo Conception M.D.   On: 03/24/2020 11:56    Procedures Procedures   Medications Ordered in ED Medications  ondansetron (ZOFRAN) injection 4 mg (4 mg Intravenous Given 03/24/20 1307)  morphine 4 MG/ML injection 4 mg (4 mg Intravenous Given 03/24/20 1307)  sodium chloride 0.9 % bolus 1,000 mL (0 mLs Intravenous Stopped 03/24/20 1416)    ED Course  I have reviewed the triage vital signs and the nursing notes.  Pertinent labs & imaging results that were available during my care of the patient were reviewed by me and considered in my medical decision making (see chart for details).    MDM Rules/Calculators/A&P                           60 year old female who presents for evaluation of a month of intermittent chest pain, worsening dyspnea on exertion.  She is also felt like her heart has gotten slow at times.  She saw her primary care doctor today and they wanted her to come to the ED for further evaluation.  On initial arrival, she is afebrile, nontoxic-appearing.  Vital signs are stable.  On exam, she is well-appearing.  Concern for ACS etiology versus new onset heart failure versus infectious etiology.  History/physical exam concerning for PE.  She tells me she has seen a cardiologist before but she cannot remember the name.  She states she was previously on carvedilol and nitro but states she has not taken those in several years.   CBC shows leukopenia of 3.2. Hgb is 11.5.  BMP shows normal BUN and creatinine.  BMP is unremarkable.  Initial troponin negative.  CXR is negative for any acute impression.   Patient having episodes of bradycardia here in the ED.  On the monitor, her heart rate does not drop below 42 but she is symptomatic.  At this time, concern for symptomatic bradycardia with a new second-degree block.  We will consult cardiology for possible admission.   Discussed patient with Dr. Einar Gip (Cardiology) who agrees with plan for admission and will plan to admit her to cardiology service. Updated patient on plan.   Portions of this note were generated with [employer]. Dictation errors may occur despite best attempts at proofreading.   Final Clinical Impression(s) / ED Diagnoses Final diagnoses:  2nd degree AV block  Symptomatic bradycardia    Rx / DC Orders ED Discharge Orders    None       Volanda Napoleon, PA-C  03/24/20 1530    Daleen Bo, MD 03/26/20 0900

## 2020-03-25 ENCOUNTER — Inpatient Hospital Stay (HOSPITAL_COMMUNITY): Payer: BC Managed Care – PPO

## 2020-03-25 ENCOUNTER — Encounter: Payer: Self-pay | Admitting: Cardiology

## 2020-03-25 DIAGNOSIS — I441 Atrioventricular block, second degree: Secondary | ICD-10-CM

## 2020-03-25 DIAGNOSIS — R001 Bradycardia, unspecified: Secondary | ICD-10-CM

## 2020-03-25 MED ORDER — LORAZEPAM 1 MG PO TABS: 0.5000 mg | ORAL_TABLET | Freq: Once | ORAL | Status: DC

## 2020-03-25 MED ORDER — LORAZEPAM 2 MG/ML IJ SOLN
0.5000 mg | Freq: Once | INTRAMUSCULAR | Status: AC
  Administered 2020-03-25: 0.5 mg via INTRAVENOUS
  Filled 2020-03-25: qty 1

## 2020-03-25 MED ORDER — GADOBUTROL 1 MMOL/ML IV SOLN
10.0000 mL | Freq: Once | INTRAVENOUS | Status: AC | PRN
  Administered 2020-03-25: 10 mL via INTRAVENOUS

## 2020-03-25 MED ORDER — LORAZEPAM 2 MG/ML IJ SOLN
INTRAMUSCULAR | Status: AC
  Administered 2020-03-25: 0.5 mg
  Filled 2020-03-25: qty 1

## 2020-03-25 NOTE — Discharge Summary (Incomplete)
Physician Discharge Summary  Patient ID: Danielle Mullins MRN: [id number] DOB/AGE: 1960-04-30 60 y.o. Danielle Beard, MD   Admit date: 03/24/2020 Discharge date: 03/25/2020  Primary Discharge Diagnosis Mobitz 2 AV block Dyspnea on exertion and reduced exercise tolerance, heart block could be contributing but also contributed by obesity and deconditioning. Obstructive sleep apnea, presently patient is stopped using CPAP.  3 years previously she was morbidly obese now moderately obese. Musculoskeletal chest pain Primary hypertension Diabetes mellitus type 2 controlled without hypoglycemia Moderate obesity  Consultation: EP consultation was obtained from Dr. Thompson Grayer who recommended pacemaker implantation, patient wanted to wait on this.  He recommended that we continue to follow the patient and if symptomatic or if patient agreeable to referral back to him for consideration for pacemaker implantation.  Significant Diagnostic Studies:  Echocardiogram 03/24/2020:   Patient appears to be in Mobitz Ii AV block. Left ventricular ejection fraction, by estimation, is 60 to 65%. The left ventricle has normal function. The left ventricle has no regional wall motion abnormalities. Indeterminate diastolic filling due to E-A fusion.Right ventricular systolic function is normal.  The right ventricular size is normal. The mitral valve is normal in structure. No evidence of mitral valve regurgitation. No evidence of mitral stenosis. The aortic valve is normal in structure. Aortic valve regurgitation is not visualized. No aortic stenosis is present. The inferior vena cava is normal in size with <50% respiratory variability, suggesting right atrial pressure of 8 mmHg. No significant change from 09/16/2015.  EKG:  EKG 03/24/2020: Underlying sinus rhythm with Mobitz 2 AV block, 3-1 block.  Left axis deviation.  Cannot exclude lateral infarct old.  Marked LVH.  Radiology:  MR cardiac morphology with and  without contrast 03/25/2020: MPRESSION: 1.  Study limited, patient unable to complete due to claustrophobia. 2.  Normal LV size and wall thickness, EF 65%. 3.  Normal RV size and systolic function, EF 26%. 4. No myocardial LGE, so no definitive evidence for prior MI, infiltrative disease or myocarditis.  DG Chest 2 View Result Date: 03/24/2020 CLINICAL DATA:  Chest pain. EXAM: CHEST - 2 VIEW COMPARISON:  None. FINDINGS: The heart size and mediastinal contours are within normal limits. Both lungs are clear. No pneumothorax or pleural effusion is noted. The visualized skeletal structures are unremarkable. IMPRESSION: No active cardiopulmonary disease.  Hospital Course: Danielle Mullins is a 60 y.o. female  African-American female with hypertension, hyperlipidemia, diabetes mellitus, obstructive sleep apnea presently not using CPAP, morbid obesity with BMI of 41 in 2018 and has lost about 30 to 40 pounds over the past 3 years, had been doing well except for chronic dyspnea on exertion ongoing for the past 1 year.  She has had chronic dyspnea prior to that but gradually worse.  She was seen by her PCP today when she presented with chest pain and dyspnea and EKG in the office was abnormal and hence patient was advised to go to the emergency room on 03/24/2020.  I made the patient walk in the room with telemetry on board.  She was able to mount heart rate response to around 95 bpm with sinus and one-to-one conduction without heart block.  However immediately upon resting there was recurrence of Mobitz 2 AV block.  EP consultation was obtained, it was felt that she would probably benefit from pacemaker implantation, however patient did not want to go through with this.  Cardiac MR was ordered to exclude any structural abnormality and infiltrative heart disease which was unremarkable.  It  was felt that she is safe to be discharged home as she has not had any syncope, has underlying stable escape rhythm and consider  outpatient work-up for myocardial ischemia and also chronotropic response to exercise treadmill stress.  Recommendations on discharge: Patient will be scheduled for exercise nuclear stress test.  Will avoid negative chronotropic agent for now.  Weight loss again discussed with the patient.  She is advised to contact us immediately if she has dizziness or syncope.  She was previously diagnosed with obstructive sleep apnea when she was morbidly obese.  However over the past 3 years she has lost weight and now she is moderately obese.  For her dyspnea, fatigue we should probably consider repeating sleep study.  She has mild anemia, microcytic and may need work-up for the same.  She does have hyperlipidemia presently on simvastatin, may need to add Zetia, will discuss during office visit.  Blood pressures well controlled.  Diabetes is well controlled.  Discharge Exam: Vitals with BMI 03/25/2020 03/25/2020 03/25/2020  Height - - -  Weight - - -  BMI - - -  Systolic 564 332 951  Diastolic 58 68 85  Pulse 80 81 79     Physical Exam Constitutional:      Comments: Moderately obese in no acute distress  Cardiovascular:     Rate and Rhythm: Normal rate and regular rhythm.     Pulses: Intact distal pulses.     Heart sounds: Normal heart sounds. No murmur heard. No gallop.      Comments: No leg edema, no JVD. Pulmonary:     Effort: Pulmonary effort is normal.     Breath sounds: Normal breath sounds.  Abdominal:     General: Bowel sounds are normal.     Palpations: Abdomen is soft.    Labs:   Lab Results  Component Value Date   WBC 4.7 03/24/2020   HGB 10.7 (L) 03/24/2020   HCT 33.4 (L) 03/24/2020   MCV 78.4 (L) 03/24/2020   PLT 375 03/24/2020    Recent Labs  Lab 03/24/20 1133 03/24/20 1843  NA 138  --   K 3.8  --   CL 101  --   CO2 27  --   BUN 7  --   CREATININE 0.86 0.93  CALCIUM 9.0  --   GLUCOSE 124*  --     Lipid Panel     Component Value Date/Time   CHOL 175  03/24/2020 1843   TRIG 56 03/24/2020 1843   HDL 35 (L) 03/24/2020 1843   CHOLHDL 5.0 03/24/2020 1843   VLDL 11 03/24/2020 1843   LDLCALC 129 (H) 03/24/2020 1843    BNP (last 3 results) Recent Labs    03/24/20 1236  BNP 10.6   HEMOGLOBIN A1C No results found for: HGBA1C, MPG  TSH Recent Labs    03/24/20 1940  TSH 2.696    FOLLOW UP PLANS AND APPOINTMENTS  Allergies as of 03/25/2020   No Known Allergies     Medication List    TAKE these medications   gabapentin 100 MG capsule Commonly known as: NEURONTIN Take 100 mg by mouth at bedtime.   lisinopril-hydrochlorothiazide 10-12.5 MG tablet Commonly known as: ZESTORETIC Take 1 tablet by mouth daily.   loratadine 10 MG tablet Commonly known as: CLARITIN Take 10 mg by mouth as needed for allergies.   metFORMIN 500 MG tablet Commonly known as: GLUCOPHAGE Take 500 mg by mouth 3 (three) times daily with meals.  naproxen 500 MG tablet Commonly known as: NAPROSYN Take 500 mg by mouth daily.   simvastatin 40 MG tablet Commonly known as: ZOCOR Take 40 mg by mouth at bedtime.       Follow-up Information    Danielle Berthold, PA-C Follow up on 04/02/2020.   Specialty: Cardiology Why: 11:30 AM appointment. Bring all medications Contact information: [place] [id number] [phone]                Adrian Prows, MD, [facility] 03/25/2020, 2:26 PM Office: [phone]

## 2020-03-25 NOTE — Consult Note (Addendum)
ELECTROPHYSIOLOGY CONSULT NOTE    Patient ID: Danielle Mullins MRN: [id number], DOB/AGE: 1960/04/07 60 y.o.  Admit date: 03/24/2020 Date of Consult: 03/25/2020  Primary Physician: Iona Beard, MD Primary Cardiologist: Dr. Einar Gip Electrophysiologist: New  Referring Provider: Dr. Einar Gip  Patient Profile: Danielle Mullins is a 60 y.o. female with a history of HTN, HLD, DM2, OSA currently not using CPAP, morbid obesity with Body mass index is 37.12 kg/m.who is being seen today for the evaluation of second degree heart block at the request of Dr. Einar Gip.  HPI:  Danielle Mullins is a 60 y.o. female with medical history as above. She states she has had DOE for approx 1 year, that has gradually worsened. She was seen by her PCP 2/21 for DOE and CP and was sent to the ER with abnormal EKG.   Pt denies any dizziness, syncope, leg edema, PND, or orthopnea. Chest pain is a sharp pain in the middle of her chest noted at any time during the day, but mostly during rest or routine activity. She denies exertional component. HS trop negative  Pertinent labs on admission include COVID negative, TSH WNL, K 3.8, Cr 0.93, BNP 10.6, WBC 3.2, Hgb 11.5  Lipids: Cholesterol 175, TG 56, HDL 35, LDL 129  She previously had a stress test without any ischemia in 08/2015. She had dizziness and fatigue during the test. BMI > 40 at the time.   She denies lightheadedness, dizziness, syncope, near syncope, or edema. She has intermittent SOB with walking long distances or up an incline/stairs.   Past Medical History:  Diagnosis Date  . Diabetes mellitus without complication ([place])   . Heart disease, unspecified e   enlarged heart chamber- patient has referral  . Hypertension      Surgical History: History reviewed. No pertinent surgical history.   (Not in a hospital admission)   Inpatient Medications:  . gabapentin  100 mg Oral QHS  . heparin  5,000 Units Subcutaneous Q8H  . hydrochlorothiazide  12.5 mg Oral  Daily  . lisinopril  10 mg Oral Daily  . metFORMIN  500 mg Oral TID WC  . simvastatin  40 mg Oral QHS    Allergies: No Known Allergies  Social History   Socioeconomic History  . Marital status: Divorced    Spouse name: Not on file  . Number of children: Not on file  . Years of education: Not on file  . Highest education level: Not on file  Occupational History  . Not on file  Tobacco Use  . Smoking status: Never Smoker  . Smokeless tobacco: Never Used  Substance and Sexual Activity  . Alcohol use: No    Alcohol/week: 0.0 standard drinks  . Drug use: No  . Sexual activity: Not Currently  Other Topics Concern  . Not on file  Social History Narrative  . Not on file   Social Determinants of Health   Financial Resource Strain: Not on file  Food Insecurity: Not on file  Transportation Needs: Not on file  Physical Activity: Not on file  Stress: Not on file  Social Connections: Not on file  Intimate Partner Violence: Not on file     Family History  Problem Relation Age of Onset  . Diabetes Mother   . Hypertension Mother   . Heart disease Father   . Hypertension Father   . Diabetes Brother      Review of Systems: All other systems reviewed and are otherwise negative except as noted  above.  Physical Exam: Vitals:   03/24/20 2206 03/25/20 0000 03/25/20 0200 03/25/20 0400  BP: 127/61 107/69 114/67 124/85  Pulse: 83 79 82 79  Resp: 11 15 15 15   Temp:      TempSrc:      SpO2: 95% 93% 96% 96%  Weight:      Height:        GEN- The patient is well appearing, alert and oriented x 3 today.   HEENT: normocephalic, atraumatic; sclera clear, conjunctiva pink; hearing intact; oropharynx clear; neck supple Lungs- Clear to ausculation bilaterally, normal work of breathing.  No wheezes, rales, rhonchi Heart- Regular rate and rhythm, no murmurs, rubs or gallops GI- soft, non-tender, non-distended, bowel sounds present Extremities- no clubbing, cyanosis, or edema;  DP/PT/radial pulses 2+ bilaterally MS- no significant deformity or atrophy Skin- warm and dry, no rash or lesion Psych- euthymic mood, full affect Neuro- strength and sensation are intact  Labs:   Lab Results  Component Value Date   WBC 4.7 03/24/2020   HGB 10.7 (L) 03/24/2020   HCT 33.4 (L) 03/24/2020   MCV 78.4 (L) 03/24/2020   PLT 375 03/24/2020    Recent Labs  Lab 03/24/20 1133 03/24/20 1843  NA 138  --   K 3.8  --   CL 101  --   CO2 27  --   BUN 7  --   CREATININE 0.86 0.93  CALCIUM 9.0  --   GLUCOSE 124*  --       Radiology/Studies: DG Chest 2 View  Result Date: 03/24/2020 CLINICAL DATA:  Chest pain. EXAM: CHEST - 2 VIEW COMPARISON:  None. FINDINGS: The heart size and mediastinal contours are within normal limits. Both lungs are clear. No pneumothorax or pleural effusion is noted. The visualized skeletal structures are unremarkable. IMPRESSION: No active cardiopulmonary disease. Electronically Signed   By: Marijo Conception M.D.   On: 03/24/2020 11:56   ECHOCARDIOGRAM COMPLETE  Result Date: 03/24/2020    ECHOCARDIOGRAM REPORT   Patient Name:   Danielle Mullins Date of Exam: 03/24/2020 Medical Rec #:  [id number]       Height:       66.0 in Accession #:    [id number]      Weight:       230.0 lb Date of Birth:  11-Oct-1960       BSA:          2.122 m Patient Age:    73 years        BP:           138/77 mmHg Patient Gender: F               HR:           60 bpm. Exam Location:  Inpatient Procedure: 2D Echo Indications:     dyspnea. second degree AV block, Mobitz type 2.  History:         Patient has no prior history of Echocardiogram examinations.                  Arrythmias:bradycardia; Signs/Symptoms:Chest Pain.  Sonographer:     Johny Chess RDCS Referring Phys:  [id number] Adrian Prows Diagnosing Phys: Adrian Prows MD  Sonographer Comments: Image acquisition challenging due to patient body habitus. IMPRESSIONS  1. Patient appears to be in Mobitz Ii AV block. Left ventricular ejection  fraction, by estimation, is 60 to 65%. The left ventricle has normal function. The left ventricle has no regional  wall motion abnormalities. Indeterminate diastolic filling due to  E-A fusion.  2. Right ventricular systolic function is normal. The right ventricular size is normal.  3. The mitral valve is normal in structure. No evidence of mitral valve regurgitation. No evidence of mitral stenosis.  4. The aortic valve is normal in structure. Aortic valve regurgitation is not visualized. No aortic stenosis is present.  5. The inferior vena cava is normal in size with <50% respiratory variability, suggesting right atrial pressure of 8 mmHg. FINDINGS  Left Ventricle: Patient appears to be in Mobitz Ii AV block. Left ventricular ejection fraction, by estimation, is 60 to 65%. The left ventricle has normal function. The left ventricle has no regional wall motion abnormalities. The left ventricular internal cavity size was small. There is no left ventricular hypertrophy. Indeterminate diastolic filling due to E-A fusion. Right Ventricle: The right ventricular size is normal. No increase in right ventricular wall thickness. Right ventricular systolic function is normal. Left Atrium: Left atrial size was normal in size. Right Atrium: Right atrial size was normal in size. Pericardium: There is no evidence of pericardial effusion. Mitral Valve: The mitral valve is normal in structure. No evidence of mitral valve regurgitation. No evidence of mitral valve stenosis. Tricuspid Valve: The tricuspid valve is normal in structure. Tricuspid valve regurgitation is not demonstrated. No evidence of tricuspid stenosis. Aortic Valve: The aortic valve is normal in structure. Aortic valve regurgitation is not visualized. No aortic stenosis is present. Pulmonic Valve: The pulmonic valve was normal in structure. Pulmonic valve regurgitation is not visualized. No evidence of pulmonic stenosis. Aorta: The aortic root is normal in size and  structure. Venous: The inferior vena cava is normal in size with less than 50% respiratory variability, suggesting right atrial pressure of 8 mmHg. IAS/Shunts: No atrial level shunt detected by color flow Doppler.  LEFT VENTRICLE PLAX 2D LVIDd:         3.80 cm LVIDs:         2.60 cm LV PW:         0.80 cm LV IVS:        1.00 cm LVOT diam:     1.80 cm LVOT Area:     2.54 cm  RIGHT VENTRICLE             IVC RV S prime:     14.10 cm/s  IVC diam: 1.60 cm TAPSE (M-mode): 2.5 cm LEFT ATRIUM             Index       RIGHT ATRIUM           Index LA diam:        3.10 cm 1.46 cm/m  RA Area:     12.40 cm LA Vol (A2C):   45.4 ml 21.39 ml/m RA Volume:   28.80 ml  13.57 ml/m LA Vol (A4C):   47.7 ml 22.48 ml/m LA Biplane Vol: 50.6 ml 23.84 ml/m   AORTA Ao Root diam: 2.80 cm Ao Asc diam:  3.00 cm  SHUNTS Systemic Diam: 1.80 cm Adrian Prows MD Electronically signed by Adrian Prows MD Signature Date/Time: 03/24/2020/6:48:02 PM    Final     EKG:on arrival shows missed beats with HR 57 bpm (personally reviewed)  TELEMETRY: Intermittent second degree block with rates ~50s, atrial rate in 80-90s with occasional 1:1 conduction with activity. At times she has clear PR lengthening, with PR shortening after a missed beat.  (personally reviewed)  Assessment/Plan: 1.  Missed beats,  second degree AV block (Mobitz I vs Mobitz II with PR lengthening at times)  No reversible cause identified.  Not on AV nodal blockade TSH and electrolytes WNL. Explained risks, benefits, and alternatives to PPM implantation, including but not limited to bleeding, infection, pneumothorax, pericardial effusion, lead dislodgement, heart attack, stroke, or death.  Pt verbalized understanding and is considering today vs outpatient follow up. She would like to avoid a pacemaker if "at all possible".  We discussed that her conduction will likely worsen as she gets older.  We have ordered a cMRI with relative young age and no obvious other reasons for heart  block  2. Atypical chest pain HS trop negative x 2 COVID negative Slightly tender along the inferior margin of sternum. ? MSK etiology.  3. DOE Multifactorial due to deconditioning, obesity, and intermittent heart block.   4. HTN Well controlled at baseline.   5. Obesity Body mass index is 37.12 kg/m.  CPAP encouraged (she stopped using this as she felt better as she lost weight.  Leave NPO for now. Plan cMRI for this am. With chronicity of her symptoms may also be reasonable to follow up as an outpatient. She would prefer to avoid/delay as long as possible.   For questions or updates, please contact [place] Please consult [email or web address] for contact info under Cardiology/STEMI.  Signed, Shirley Friar, PA-C  03/25/2020 7:01 AM  I have seen, examined the patient, and reviewed the above assessment and plan.  Changes to above are made where necessary.  On exam, RRR.  The patient has likely AV nodal disease.  qrs is narrow and at times there is pr prolongation prior to block.  With ambulation, her AV conduction improves. Cardiac MRI as above is pending. I have advised PPM  Given symptomatic AV block.  Risks, benefits, alternatives to pacemaker implantation were discussed in detail with the patient today. The patient understands that the risks include but are not limited to bleeding, infection, pneumothorax, perforation, tamponade, vascular damage, renal failure, MI, stroke, death,  and lead dislodgement and wishes to decline pacing at this time.  She understands risks of worsening AV block and syncope but states "I dont think I want to do that right now". She wishes to follow closely with Dr Einar Gip and consider pacing down the road if her symptoms persist.  I have spoken with Dr Einar Gip.  We will both respect the patients wishes but have both advised pacing. Will obtain outpatient exercise myoview to further evaluate SOB and also heart rate response to  activity.  Electrophysiology team to see as needed while here. Please call with questions.    Co Sign: Thompson Grayer, MD 03/25/2020 10:19 AM

## 2020-04-01 NOTE — Progress Notes (Signed)
Primary Physician/Referring:  Iona Beard, MD  Patient ID: Danielle Mullins, female    DOB: [birth year], 60 y.o.   MRN: [id number]  Chief Complaint  Patient presents with  . Dyspnea on exertion  . Symptomatic bradycardia  . Hospitalization Follow-up  . New Patient (Initial Visit)  . Mobitz type 2 second degree AV block   HPI:    Danielle Mullins  is a 60 y.o. African-American female with hypertension, hyperlipidemia, diabetes mellitus, obstructive sleep apnea presently not using CPAP, morbid obesity with BMI of 41 in 2018 and has lost about 30 to 40 pounds over the past 3 years. 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 She now presents for hospital follow up.   Patient has had no recurrence of chest pain since hospitalization.  She does continue to have mild dyspnea on exertion and fatigue particularly when she goes up the steps.  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  She states she has been experiencing fatigue at work particularly on her first day back, however this is improved over the last couple days.  Patient states during hospitalization she had refused recommended pacemaker as she was "shocked she needed it".  She denies chest pain, palpitations, syncope, near syncope, dizziness, leg swelling.  Denies orthopnea, PND.  Discharge Summary 03/25/2020 via Dr. Einar Gip:  Hospital Course:  Danielle Mullins is a 60 y.o. female  African-American female with hypertension, hyperlipidemia, diabetes mellitus, obstructive sleep apnea presently not using CPAP, morbid obesity with BMI of 41 in 2018 and has lost about 30 to 40 pounds over the past 3 years, had been doing well except for chronic dyspnea on exertion ongoing for the past 1 year. She has had chronic dyspnea prior to that but gradually worse.  She was seen by her PCP today when she presented with chest pain and dyspnea and EKG in the office was abnormal and hence patient was advised to go to the emergency room on 03/24/2020.  I made the patient walk in the room with telemetry on board. She was able to mount heart rate response to around 95 bpm with sinus and one-to-one conduction without heart block. However immediately upon resting there was recurrence of Mobitz 2 AV block.  EP consultation was obtained, it was felt that she would probably benefit from pacemaker implantation, however patient did not want to go through with this.  Cardiac MR was ordered to exclude any structural abnormality and infiltrative heart disease which was unremarkable.  It was felt that she is safe to be discharged home as she has not had any syncope, has underlying stable escape rhythm and consider outpatient work-up for myocardial ischemia and also chronotropic response to exercise treadmill stress.  Recommendations on discharge:  Patient will be scheduled for exercise nuclear stress test.  Will avoid negative chronotropic agent for now.  Weight loss again discussed with the patient.  She is advised to contact us immediately if she has dizziness or syncope.  She was previously diagnosed with obstructive sleep apnea when she was morbidly obese.  However over the past 3 years she has lost weight and now she is moderately obese.  For her dyspnea, fatigue we should probably consider repeating sleep study.  She has mild anemia, microcytic and may need work-up  for the same.  She does have hyperlipidemia presently on simvastatin, may need to add Zetia, will discuss during office visit.  Blood pressures well controlled.  Diabetes is well controlled.  Past Medical History:  Diagnosis Date  . Diabetes mellitus without complication ([place])   . Heart disease, unspecified e   enlarged heart chamber- patient has referral  . Hypertension    History reviewed. No  pertinent surgical history. Family History  Problem Relation Age of Onset  . Diabetes Mother   . Hypertension Mother   . Heart disease Father   . Hypertension Father   . Diabetes Brother     Social History   Tobacco Use  . Smoking status: Never Smoker  . Smokeless tobacco: Never Used  Substance Use Topics  . Alcohol use: No    Alcohol/week: 0.0 standard drinks   Marital Status: Divorced   ROS  Review of Systems  Constitutional: Positive for malaise/fatigue. Negative for weight gain.  Cardiovascular: Positive for dyspnea on exertion. Negative for chest pain, claudication, leg swelling, near-syncope, orthopnea, palpitations, paroxysmal nocturnal dyspnea and syncope.  Hematologic/Lymphatic: Does not bruise/bleed easily.  Gastrointestinal: Negative for melena.  Neurological: Negative for dizziness and weakness.    Objective  Blood pressure 130/73, pulse 95, resp. rate 16, height 5\' 7"  (1.702 m), weight 230 lb (104.3 kg), SpO2 98 %.  Vitals with BMI 04/02/2020 03/25/2020 03/25/2020  Height 5\' 7"  - -  Weight 230 lbs - -  BMI 42.35 - -  Systolic 361 443 154  Diastolic 73 76 58  Pulse 95 79 80      Physical Exam Vitals reviewed.  Constitutional:      Appearance: She is obese.  HENT:     Head: Normocephalic and atraumatic.  Cardiovascular:     Rate and Rhythm: Normal rate and regular rhythm.     Pulses: Intact distal pulses.          Carotid pulses are 2+ on the right side and 2+ on the left side.      Radial pulses are 2+ on the right side and 2+ on the left side.       Popliteal pulses are 2+ on the right side and 2+ on the left side.       Dorsalis pedis pulses are 2+ on the right side and 2+ on the left side.       Posterior tibial pulses are 1+ on the right side and 1+ on the left side.     Heart sounds: S1 normal and S2 normal. No murmur heard. No gallop.      Comments: Femoral pulse difficult to evaluate due to patient body habitus. Pulmonary:     Effort: Pulmonary  effort is normal. No respiratory distress.     Breath sounds: No wheezing, rhonchi or rales.  Abdominal:     General: Bowel sounds are normal.     Palpations: Abdomen is soft.  Musculoskeletal:     Right lower leg: No edema.     Left lower leg: No edema.  Skin:    General: Skin is warm and dry.  Neurological:     Mental Status: She is alert.     Laboratory examination:   Recent Labs    03/24/20 1133 03/24/20 1843  NA 138  --   K 3.8  --   CL 101  --   CO2 27  --   GLUCOSE 124*  --   BUN 7  --   CREATININE 0.86  0.93  CALCIUM 9.0  --   GFRNONAA >60 >60   estimated creatinine clearance is 79.9 mL/min (by C-G formula based on SCr of 0.93 mg/dL).  CMP Latest Ref Rng & Units 03/24/2020 03/24/2020  Glucose 70 - 99 mg/dL - 124(H)  BUN 6 - 20 mg/dL - 7  Creatinine 0.44 - 1.00 mg/dL 0.93 0.86  Sodium 135 - 145 mmol/L - 138  Potassium 3.5 - 5.1 mmol/L - 3.8  Chloride 98 - 111 mmol/L - 101  CO2 22 - 32 mmol/L - 27  Calcium 8.9 - 10.3 mg/dL - 9.0   CBC Latest Ref Rng & Units 03/24/2020 03/24/2020  WBC 4.0 - 10.5 K/uL 4.7 3.2(L)  Hemoglobin 12.0 - 15.0 g/dL 10.7(L) 11.5(L)  Hematocrit 36.0 - 46.0 % 33.4(L) 38.6  Platelets 150 - 400 K/uL 375 413(H)    Lipid Panel Recent Labs    03/24/20 1843  CHOL 175  TRIG 56  LDLCALC 129*  VLDL 11  HDL 35*  CHOLHDL 5.0    HEMOGLOBIN A1C No results found for: HGBA1C, MPG TSH Recent Labs    03/24/20 1940  TSH 2.696    External labs:  None   Medications and allergies  No Known Allergies   Outpatient Medications Prior to Visit  Medication Sig Dispense Refill  . gabapentin (NEURONTIN) 100 MG capsule Take 100 mg by mouth at bedtime.    Marland Kitchen lisinopril-hydrochlorothiazide (ZESTORETIC) 10-12.5 MG tablet Take 1 tablet by mouth daily.    . metFORMIN (GLUCOPHAGE) 500 MG tablet Take 500 mg by mouth 3 (three) times daily with meals.    . naproxen (NAPROSYN) 500 MG tablet Take 500 mg by mouth daily.    . simvastatin (ZOCOR) 40 MG  tablet Take 40 mg by mouth at bedtime.    Marland Kitchen loratadine (CLARITIN) 10 MG tablet Take 10 mg by mouth as needed for allergies.     No facility-administered medications prior to visit.     Radiology:   No results found. MR cardiac morphology with and without contrast 03/25/2020: MPRESSION: 1.  Study limited, patient unable to complete due to claustrophobia. 2.  Normal LV size and wall thickness, EF 65%. 3.  Normal RV size and systolic function, EF 60%. 4. No myocardial LGE, so no definitive evidence for prior MI, infiltrative disease or myocarditis.  DG Chest 2 View Result Date: 03/24/2020 CLINICAL DATA:  Chest pain. EXAM: CHEST - 2 VIEW COMPARISON:  None. FINDINGS: The heart size and mediastinal contours are within normal limits. Both lungs are clear. No pneumothorax or pleural effusion is noted. The visualized skeletal structures are unremarkable. IMPRESSION: No active cardiopulmonary disease. Cardiac Studies:   Echocardiogram 03/24/2020: Patient appears to be in Mobitz II AV block. Left ventricular ejection fraction, by estimation, is 60 to 65%. The left ventricle has normal function. The left ventricle has no regional wall motion abnormalities. Indeterminate diastolic filling due to E-A fusion.Right ventricular systolic function is normal.  The right ventricular size is normal. The mitral valve is normal in structure. No evidence of mitral valve regurgitation. No evidence of mitral stenosis. The aortic valve is normal in structure. Aortic valve regurgitation is not visualized. No aortic stenosis is present. The inferior vena cava is normal in size with <50% respiratory variability, suggesting right atrial pressure of 8 mmHg.No significant change from08/15/2017.  EKG:   EKG 04/02/2020: Sinus rhythm with first-degree AV block with sinus pauses at a rate of 90 bpm.  Left axis, left anterior fascicular block.  Poor R wave  progression, cannot exclude anteroseptal infarct old.  EKG 03/24/2020:  Underlying sinus rhythm with Mobitz 2 AV block, 3-1 block. Left axis deviation. Cannot exclude lateral infarct old. Marked LVH.  Assessment     ICD-10-CM   1. Mobitz type 2 second degree AV block  I44.1 EKG 12-Lead    PCV MYOCARDIAL PERFUSION WO LEXISCAN  2. Essential hypertension  I10 PCV MYOCARDIAL PERFUSION WO LEXISCAN  3. Hypercholesterolemia  E78.00   4. OSA (obstructive sleep apnea)  G47.33   5. Type 1 diabetes mellitus without complication (HCC)  J00.9   6. Morbid obesity ([place])  E66.01   7. Precordial pain  R07.2 PCV MYOCARDIAL PERFUSION WO LEXISCAN     Medications Discontinued During This Encounter  Medication Reason  . loratadine (CLARITIN) 10 MG tablet Error    Meds ordered this encounter  Medications  . ezetimibe (ZETIA) 10 MG tablet    Sig: Take 1 tablet (10 mg total) by mouth daily.    Dispense:  30 tablet    Refill:  3    Recommendations:   KRYSTALYNN RIDGEWAY is a 60 y.o. African-American female with hypertension, hyperlipidemia, diabetes mellitus, obstructive sleep apnea presently not using CPAP, morbid obesity with BMI of 41 in 2018 and has lost about 30 to 40 pounds over the past 3 years. 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 She now presents for hospital follow up.   Overall patient remains relatively asymptomatic, her only complaint is fatigue and dyspnea on exertion which have been improving over the last 2-3 days.  Patient has returned to work, which is physically demanding without issue.  EKG today revealed sinus rhythm with first-degree AV block, low did reveal brief sinus pause.  Patient has had no episodes of syncope or near syncope, and no recurrence of chest pain.  As she is presently relatively asymptomatic and EKG without Mobitz 2 AV block, no indication for pacemaker at this time.  However upon further discussion with  patient she reports that his pacemaker is indicated, she is open to it.  We will continue to avoid negative chronotropic agents.  We will obtain exercise nuclear stress test in order to evaluate for underlying ischemia as well as to assess chronotropic competency.  Patient will notify our office immediately if she experiences dizziness, syncope, near syncope, or recurrence of chest pain.  In regard to hyperlipidemia, patient is presently on simvastatin for her lipids remain elevated above goal.  We will add Zetia 10 mg daily and repeat lipid profile testing in 3 months.  Patient's blood pressure is well controlled.  We will continue lisinopril/hydrochlorothiazide.  Patient has a history of obstructive sleep apnea, however she does not use CPAP.  States she has not had a sleep study done in a "very long time".  Recommended patient be referred for sleep evaluation, however she prefers to follow-up with her PCP regarding further management of sleep apnea and CPAP titration.  Also advised patient of mild microcytic anemia noted on labs while in the hospital.  Recommend patient follow-up with her primary doctor he will regarding anemia as well.  She verbalized understanding agreement.  Follow-up in 4 weeks, for hypertension, hyperlipidemia, conduction system disease.  During this visit I reviewed and updated: Tobacco history  allergies medication reconciliation  medical history  surgical history  family history  social history.  This note was created using  a voice recognition software as a result there may be grammatical errors inadvertently enclosed that do not reflect the nature of this encounter. Every attempt is made to correct such errors.   Alethia Berthold, PA-C 04/02/2020, 5:15 PM Office: [phone]

## 2020-04-02 ENCOUNTER — Other Ambulatory Visit: Payer: Self-pay

## 2020-04-02 ENCOUNTER — Encounter: Payer: Self-pay | Admitting: Student

## 2020-04-02 ENCOUNTER — Ambulatory Visit: Payer: BC Managed Care – PPO | Admitting: Student

## 2020-04-02 VITALS — BP 130/73 | HR 95 | Resp 16 | Ht 67.0 in | Wt 230.0 lb

## 2020-04-02 DIAGNOSIS — I10 Essential (primary) hypertension: Secondary | ICD-10-CM

## 2020-04-02 DIAGNOSIS — E109 Type 1 diabetes mellitus without complications: Secondary | ICD-10-CM

## 2020-04-02 DIAGNOSIS — R072 Precordial pain: Secondary | ICD-10-CM

## 2020-04-02 DIAGNOSIS — I441 Atrioventricular block, second degree: Secondary | ICD-10-CM

## 2020-04-02 DIAGNOSIS — G4733 Obstructive sleep apnea (adult) (pediatric): Secondary | ICD-10-CM

## 2020-04-02 DIAGNOSIS — E7800 Pure hypercholesterolemia, unspecified: Secondary | ICD-10-CM

## 2020-04-02 MED ORDER — EZETIMIBE 10 MG PO TABS: 10.0000 mg | ORAL_TABLET | Freq: Every day | ORAL | 3 refills | Status: DC

## 2020-04-09 ENCOUNTER — Other Ambulatory Visit: Payer: Self-pay

## 2020-04-09 ENCOUNTER — Ambulatory Visit: Payer: BC Managed Care – PPO

## 2020-04-21 ENCOUNTER — Ambulatory Visit: Payer: BC Managed Care – PPO

## 2020-04-21 ENCOUNTER — Other Ambulatory Visit: Payer: Self-pay

## 2020-04-22 ENCOUNTER — Telehealth: Payer: Self-pay

## 2020-04-22 NOTE — Progress Notes (Signed)
Please inform patient stress test was low risk.  We will discuss further at next office visit.

## 2020-04-22 NOTE — Telephone Encounter (Signed)
-----   Message from Danielle Mullins, [place] sent at 04/22/2020 10:48 AM EDT ----- Please inform patient stress test was low risk.  We will discuss further at next office visit.

## 2020-04-30 ENCOUNTER — Ambulatory Visit: Payer: BC Managed Care – PPO | Admitting: Student

## 2020-05-05 ENCOUNTER — Other Ambulatory Visit: Payer: Self-pay | Admitting: Student

## 2020-05-06 ENCOUNTER — Other Ambulatory Visit: Payer: Self-pay

## 2020-05-06 ENCOUNTER — Ambulatory Visit: Payer: BC Managed Care – PPO | Admitting: Student

## 2020-05-06 ENCOUNTER — Encounter: Payer: Self-pay | Admitting: Student

## 2020-05-06 VITALS — BP 125/77 | HR 90 | Temp 98.2°F | Ht 67.0 in | Wt 232.0 lb

## 2020-05-06 DIAGNOSIS — G4733 Obstructive sleep apnea (adult) (pediatric): Secondary | ICD-10-CM

## 2020-05-06 DIAGNOSIS — I10 Essential (primary) hypertension: Secondary | ICD-10-CM

## 2020-05-06 DIAGNOSIS — I441 Atrioventricular block, second degree: Secondary | ICD-10-CM

## 2020-05-06 DIAGNOSIS — E7800 Pure hypercholesterolemia, unspecified: Secondary | ICD-10-CM

## 2020-05-06 NOTE — Progress Notes (Signed)
Primary Physician/Referring:  Iona Beard, MD  Patient ID: Danielle Mullins, female    DOB: 07/13/1960, 60 y.o.   MRN: [id number]  Chief Complaint  Patient presents with  . Mobitz type 2 second degree AV block  . Follow-up  . Results   HPI:    Danielle Mullins  is a 60 y.o. African-American female with hypertension, hyperlipidemia, diabetes mellitus, obstructive sleep apnea presently not using CPAP, morbid obesity with BMI of 41 in 2018 and has lost about 30 to 40 pounds over the past 3 years. 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   Patient presents for 4-week follow-up of hypertension, hyperlipidemia, and conduction system disease, as well as to review results of recent stress test.  Patient continues to feel well overall.  Denies chest pain, palpitations, syncope, near syncope, dizziness, leg swelling.  Denies orthopnea, PND.  She continues to do her job which is physically demanding without issue.  Since last visit patient reports her dyspnea on exertion as well as fatigue have both improved.  Past Medical History:  Diagnosis Date  . Diabetes mellitus without complication ([place])   . Heart disease, unspecified e   enlarged heart chamber- patient has referral  . Hypertension    History reviewed. No pertinent surgical history. Family History  Problem Relation Age of Onset  . Diabetes Mother   . Hypertension Mother   . Heart disease Father   . Hypertension Father   . Diabetes Brother     Social History   Tobacco Use  . Smoking status: Never Smoker  . Smokeless tobacco: Never Used  Substance Use Topics  . Alcohol use: No    Alcohol/week: 0.0 standard drinks   Marital Status: Divorced   ROS  Review of Systems  Constitutional: Negative for malaise/fatigue and weight gain.  Cardiovascular: Negative for chest pain, claudication, dyspnea on  exertion, leg swelling, near-syncope, orthopnea, palpitations, paroxysmal nocturnal dyspnea and syncope.  Hematologic/Lymphatic: Does not bruise/bleed easily.  Gastrointestinal: Negative for melena.  Neurological: Negative for dizziness and weakness.    Objective  Blood pressure 125/77, pulse 90, temperature 98.2 F (36.8 C), height 5\' 7"  (1.702 m), weight 232 lb (105.2 kg), SpO2 98 %.  Vitals with BMI 05/06/2020 04/02/2020 03/25/2020  Height 5\' 7"  5\' 7"  -  Weight 232 lbs 230 lbs -  BMI 79.02 40.97 -  Systolic 353 299 242  Diastolic 77 73 76  Pulse 90 95 79      Physical Exam Vitals reviewed.  Constitutional:      Appearance: She is obese.  HENT:     Head: Normocephalic and atraumatic.  Cardiovascular:     Rate and Rhythm: Normal rate and regular rhythm.     Pulses: Intact distal pulses.          Carotid pulses are 2+ on the right side and 2+ on the left side.      Radial pulses are 2+ on the right side and 2+ on the left side.       Popliteal pulses are 2+ on the right side and 2+ on the left side.       Dorsalis pedis pulses are 2+ on the right side and 2+ on the left side.       Posterior tibial pulses are 1+ on the right side and 1+ on the left side.  Heart sounds: S1 normal and S2 normal. No murmur heard. No gallop.      Comments: Femoral pulse difficult to evaluate due to patient body habitus. Pulmonary:     Effort: Pulmonary effort is normal. No respiratory distress.     Breath sounds: No wheezing, rhonchi or rales.  Musculoskeletal:     Right lower leg: No edema.     Left lower leg: No edema.  Skin:    General: Skin is warm and dry.  Neurological:     General: No focal deficit present.     Mental Status: She is alert and oriented to person, place, and time.     Laboratory examination:   Recent Labs    03/24/20 1133 03/24/20 1843  NA 138  --   K 3.8  --   CL 101  --   CO2 27  --   GLUCOSE 124*  --   BUN 7  --   CREATININE 0.86 0.93  CALCIUM 9.0  --    GFRNONAA >60 >60   CrCl cannot be calculated (Patient's most recent lab result is older than the maximum 21 days allowed.).  CMP Latest Ref Rng & Units 03/24/2020 03/24/2020  Glucose 70 - 99 mg/dL - 124(H)  BUN 6 - 20 mg/dL - 7  Creatinine 0.44 - 1.00 mg/dL 0.93 0.86  Sodium 135 - 145 mmol/L - 138  Potassium 3.5 - 5.1 mmol/L - 3.8  Chloride 98 - 111 mmol/L - 101  CO2 22 - 32 mmol/L - 27  Calcium 8.9 - 10.3 mg/dL - 9.0   CBC Latest Ref Rng & Units 03/24/2020 03/24/2020  WBC 4.0 - 10.5 K/uL 4.7 3.2(L)  Hemoglobin 12.0 - 15.0 g/dL 10.7(L) 11.5(L)  Hematocrit 36.0 - 46.0 % 33.4(L) 38.6  Platelets 150 - 400 K/uL 375 413(H)    Lipid Panel Recent Labs    03/24/20 1843  CHOL 175  TRIG 56  LDLCALC 129*  VLDL 11  HDL 35*  CHOLHDL 5.0    HEMOGLOBIN A1C No results found for: HGBA1C, MPG TSH Recent Labs    03/24/20 1940  TSH 2.696    External labs:  None   Medications and allergies  No Known Allergies   Outpatient Medications Prior to Visit  Medication Sig Dispense Refill  . ezetimibe (ZETIA) 10 MG tablet TAKE 1 TABLET BY MOUTH EVERY DAY 90 tablet 1  . gabapentin (NEURONTIN) 100 MG capsule Take 100 mg by mouth at bedtime.    Marland Kitchen lisinopril-hydrochlorothiazide (ZESTORETIC) 10-12.5 MG tablet Take 1 tablet by mouth daily.    . metFORMIN (GLUCOPHAGE) 500 MG tablet Take 500 mg by mouth 3 (three) times daily with meals.    . naproxen (NAPROSYN) 500 MG tablet Take 500 mg by mouth daily.    . simvastatin (ZOCOR) 40 MG tablet Take 40 mg by mouth at bedtime.     No facility-administered medications prior to visit.     Radiology:   No results found. MR cardiac morphology with and without contrast 03/25/2020: MPRESSION: 1.  Study limited, patient unable to complete due to claustrophobia. 2.  Normal LV size and wall thickness, EF 65%. 3.  Normal RV size and systolic function, EF 77%. 4. No myocardial LGE, so no definitive evidence for prior MI, infiltrative disease or  myocarditis.  DG Chest 2 View Result Date: 03/24/2020 CLINICAL DATA:  Chest pain. EXAM: CHEST - 2 VIEW COMPARISON:  None. FINDINGS: The heart size and mediastinal contours are within normal limits. Both lungs are  clear. No pneumothorax or pleural effusion is noted. The visualized skeletal structures are unremarkable. IMPRESSION: No active cardiopulmonary disease. Cardiac Studies:   Exercise tetrofosmin stress test  04/21/2020: Normal ECG stress. The patient exercised for 5 minutes and 0 seconds of a Bruce protocol, achieving approximately 7.02 METs. Baseline heart rate was 70 bpm. A maximum heart rate of 150 beats per minute was achieved, which is 94% of the maximum predicted heart rate response.  No heart block and stress EKG negative for ischemia. No chest pain. Normal BP response.  There is a small sized mild reversible defect in the inferior region. noted superimposed on breast tissue attenuation. Overall LV systolic function is normal without regional wall motion abnormalities. Stress LV EF: 60-65%.  No previous exam available for comparison. Low risk study.   Echocardiogram 03/24/2020: Patient appears to be in Mobitz II AV block. Left ventricular ejection fraction, by estimation, is 60 to 65%. The left ventricle has normal function. The left ventricle has no regional wall motion abnormalities. Indeterminate diastolic filling due to E-A fusion.Right ventricular systolic function is normal.  The right ventricular size is normal. The mitral valve is normal in structure. No evidence of mitral valve regurgitation. No evidence of mitral stenosis. The aortic valve is normal in structure. Aortic valve regurgitation is not visualized. No aortic stenosis is present. The inferior vena cava is normal in size with <50% respiratory variability, suggesting right atrial pressure of 8 mmHg.No significant change from08/15/2017.  EKG:   EKG 04/02/2020: Sinus rhythm with first-degree AV block with sinus  pauses at a rate of 90 bpm.  Left axis, left anterior fascicular block.  Poor R wave progression, cannot exclude anteroseptal infarct old.  EKG 03/24/2020: Underlying sinus rhythm with Mobitz 2 AV block, 3-1 block. Left axis deviation. Cannot exclude lateral infarct old. Marked LVH.  Assessment     ICD-10-CM   1. Essential hypertension  I10   2. Hypercholesterolemia  E78.00   3. Mobitz type 2 second degree AV block  I44.1   4. OSA (obstructive sleep apnea)  G47.33      There are no discontinued medications.  No orders of the defined types were placed in this encounter.   Recommendations:   MAURY BAMBA is a 60 y.o. African-American female with hypertension, hyperlipidemia, diabetes mellitus, obstructive sleep apnea presently not using CPAP, morbid obesity with BMI of 41 in 2018 and has lost about 30 to 40 pounds over the past 3 years. 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   Patient presents for 4-week follow-up of hypertension, hyperlipidemia, conduction system disease and to discuss results of cardiac stress test.  Reviewed and discussed with patient regarding low risk stress test, revealing no significant concern for ischemia.  Patient reports fatigue and dyspnea on exertion have resolved since last visit and she is working at her physically demanding job without issue.  She remains asymptomatic, and therefore do not feel pacemaker is indicated at this time.  Continue to avoid negative chronotropic agents.  Patient will notify our office immediately if she experiences dizziness, syncope, near syncope, or recurrence of chest pain.  In regard to hyperlipidemia, patient is tolerating Zetia without issue.  She has follow-up appointment scheduled with PCP, therefore will defer rechecking of lipid profile testing to PCPs office.  Also discussed with patient  regarding CPAP titration study, she agrees to contact her  PCP at follow-up about this as well.  His blood pressure is well controlled.Patient is otherwise stable from a cardiovascular standpoint.    Follow-up in 6 months, sooner if needed, for hypertension, hyperlipidemia, and AV block.   Alethia Berthold, PA-C 05/06/2020, 4:36 PM Office: [phone]

## 2020-09-08 ENCOUNTER — Other Ambulatory Visit: Payer: Self-pay | Admitting: Student

## 2020-11-05 NOTE — Progress Notes (Signed)
Primary Physician/Referring:  Iona Beard, MD  Patient ID: [name], female    DOB: 1960/12/24, 60 y.o.   MRN: [id number]  Chief Complaint  Patient presents with   Hypertension   Follow-up   HPI:    Danielle Mullins  is a 60 y.o. African-American female with hypertension, hyperlipidemia, diabetes mellitus, obstructive sleep apnea presently not using CPAP, morbid obesity with BMI of 41 in 2018 and has lost about 30 to 40 pounds over the past 3 years. 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  Patient presents for 60-month follow-up.  At last office visit patient was stable from a cardiovascular standpoint, therefore no changes were made.  Patient remains stable without significant symptoms.  She does report occasional dyspnea on exertion particularly when she has to carry groceries up the stairs, this is stable.  Denies syncope, near syncope, dizziness, chest pain, palpitations.  Denies orthopnea, PND, leg swelling.   Notably patient is not using a CPAP and refuses referral to sleep provider for reevaluation of sleep apnea.  She also continues to wish to hold off on further evaluation by electrophysiology as she does not want pacemaker implantation done.  Past Medical History:  Diagnosis Date   Diabetes mellitus without complication ([place])    Heart disease, unspecified e   enlarged heart chamber- patient has referral   Hypertension    History reviewed. No pertinent surgical history. Family History  Problem Relation Age of Onset   Diabetes Mother    Hypertension Mother    Heart disease Father    Hypertension Father    Diabetes Brother     Social History   Tobacco Use   Smoking status: Never   Smokeless tobacco: Never  Substance Use Topics   Alcohol use: No    Alcohol/week: 0.0 standard drinks   Marital Status: Divorced   ROS  Review of  Systems  Cardiovascular:  Positive for dyspnea on exertion (occasional, stable). Negative for chest pain, claudication, leg swelling, near-syncope, orthopnea, palpitations, paroxysmal nocturnal dyspnea and syncope.  Neurological:  Negative for dizziness.   Objective  Blood pressure 129/60, pulse 62, temperature 97.7 F (36.5 C), temperature source Temporal, resp. rate 16, height 5\' 7"  (1.702 m), weight 237 lb 6.4 oz (107.7 kg), SpO2 97 %.  Vitals with BMI 11/06/2020 05/06/2020 04/02/2020  Height 5\' 7"  5\' 7"  5\' 7"   Weight 237 lbs 6 oz 232 lbs 230 lbs  BMI 37.17 25.42 70.62  Systolic 376 283 151  Diastolic 60 77 73  Pulse 62 90 95      Physical Exam Vitals reviewed.  Constitutional:      Appearance: She is obese.  Cardiovascular:     Rate and Rhythm: Normal rate and regular rhythm.     Pulses: Intact distal pulses.          Carotid pulses are 2+ on the right side and 2+ on the left side.      Radial pulses are 2+ on the right side and 2+ on the left side.       Popliteal pulses are 2+ on the right side and 2+ on the left side.       Dorsalis pedis pulses are 2+ on the right side and 2+ on the left side.       Posterior tibial pulses are 1+ on the right side and 1+  on the left side.     Heart sounds: S1 normal and S2 normal. No murmur heard.   No gallop.     Comments: Femoral pulse difficult to evaluate due to patient body habitus. Pulmonary:     Effort: Pulmonary effort is normal. No respiratory distress.     Breath sounds: No wheezing, rhonchi or rales.  Musculoskeletal:     Right lower leg: No edema.     Left lower leg: No edema.  Neurological:     Mental Status: She is alert.  Physical exam is unchanged compared to previous.  Laboratory examination:   Recent Labs    03/24/20 1133 03/24/20 1843  NA 138  --   K 3.8  --   CL 101  --   CO2 27  --   GLUCOSE 124*  --   BUN 7  --   CREATININE 0.86 0.93  CALCIUM 9.0  --   GFRNONAA >60 >60   CrCl cannot be calculated  (Patient's most recent lab result is older than the maximum 21 days allowed.).  CMP Latest Ref Rng & Units 03/24/2020 03/24/2020  Glucose 70 - 99 mg/dL - 124(H)  BUN 6 - 20 mg/dL - 7  Creatinine 0.44 - 1.00 mg/dL 0.93 0.86  Sodium 135 - 145 mmol/L - 138  Potassium 3.5 - 5.1 mmol/L - 3.8  Chloride 98 - 111 mmol/L - 101  CO2 22 - 32 mmol/L - 27  Calcium 8.9 - 10.3 mg/dL - 9.0   CBC Latest Ref Rng & Units 03/24/2020 03/24/2020  WBC 4.0 - 10.5 K/uL 4.7 3.2(L)  Hemoglobin 12.0 - 15.0 g/dL 10.7(L) 11.5(L)  Hematocrit 36.0 - 46.0 % 33.4(L) 38.6  Platelets 150 - 400 K/uL 375 413(H)    Lipid Panel Recent Labs    03/24/20 1843  CHOL 175  TRIG 56  LDLCALC 129*  VLDL 11  HDL 35*  CHOLHDL 5.0    HEMOGLOBIN A1C No results found for: HGBA1C, MPG TSH Recent Labs    03/24/20 1940  TSH 2.696    External labs:  None   Allergies  No Known Allergies   Medications Prior to Visit:   Outpatient Medications Prior to Visit  Medication Sig Dispense Refill   gabapentin (NEURONTIN) 100 MG capsule Take 100 mg by mouth at bedtime.     lisinopril-hydrochlorothiazide (ZESTORETIC) 10-12.5 MG tablet Take 1 tablet by mouth daily.     metFORMIN (GLUCOPHAGE) 500 MG tablet Take 500 mg by mouth 3 (three) times daily with meals.     naproxen (NAPROSYN) 500 MG tablet Take 500 mg by mouth daily.     simvastatin (ZOCOR) 40 MG tablet Take 40 mg by mouth at bedtime.     ezetimibe (ZETIA) 10 MG tablet TAKE 1 TABLET BY MOUTH EVERY DAY 90 tablet 1   No facility-administered medications prior to visit.   Final Medications at End of Visit    Current Meds  Medication Sig   gabapentin (NEURONTIN) 100 MG capsule Take 100 mg by mouth at bedtime.   lisinopril-hydrochlorothiazide (ZESTORETIC) 10-12.5 MG tablet Take 1 tablet by mouth daily.   metFORMIN (GLUCOPHAGE) 500 MG tablet Take 500 mg by mouth 3 (three) times daily with meals.   naproxen (NAPROSYN) 500 MG tablet Take 500 mg by mouth daily.   simvastatin  (ZOCOR) 40 MG tablet Take 40 mg by mouth at bedtime.   [DISCONTINUED] ezetimibe (ZETIA) 10 MG tablet TAKE 1 TABLET BY MOUTH EVERY DAY   Radiology:   No  results found. MR cardiac morphology with and without contrast 03/25/2020: MPRESSION: 1.  Study limited, patient unable to complete due to claustrophobia. 2.  Normal LV size and wall thickness, EF 65%. 3.  Normal RV size and systolic function, EF 21%. 4. No myocardial LGE, so no definitive evidence for prior MI, infiltrative disease or myocarditis.   DG Chest 2 View Result Date: 03/24/2020 CLINICAL DATA:  Chest pain. EXAM: CHEST - 2 VIEW COMPARISON:  None. FINDINGS: The heart size and mediastinal contours are within normal limits. Both lungs are clear. No pneumothorax or pleural effusion is noted. The visualized skeletal structures are unremarkable. IMPRESSION: No active cardiopulmonary disease. Cardiac Studies:   Exercise tetrofosmin stress test  04/21/2020: Normal ECG stress. The patient exercised for 5 minutes and 0 seconds of a Bruce protocol, achieving approximately 7.02 METs. Baseline heart rate was 70 bpm. A maximum heart rate of 150 beats per minute was achieved, which is 94% of the maximum predicted heart rate response.  No heart block and stress EKG negative for ischemia. No chest pain. Normal BP response.  There is a small sized mild reversible defect in the inferior region. noted superimposed on breast tissue attenuation. Overall LV systolic function is normal without regional wall motion abnormalities. Stress LV EF: 60-65%.  No previous exam available for comparison. Low risk study.   Echocardiogram 03/24/2020:   Patient appears to be in Mobitz II AV block. Left ventricular ejection fraction, by estimation, is 60 to 65%. The left ventricle has normal function. The left ventricle has no regional wall motion abnormalities. Indeterminate diastolic filling due to E-A fusion.Right ventricular systolic function is normal.  The right  ventricular size is normal. The mitral valve is normal in structure. No evidence of mitral valve regurgitation. No evidence of mitral stenosis. The aortic valve is normal in structure. Aortic valve regurgitation is not visualized. No aortic stenosis is present. The inferior vena cava is normal in size with <50% respiratory variability, suggesting right atrial pressure of 8 mmHg. No significant change from 09/16/2015.  EKG:  11/06/2020: Sinus rhythm with first-degree AV block at a rate of 60 bpm.  Left axis, left anterior fascicular block.  Poor refreshing, cannot exclude anteroseptal infarct old.  No evidence of ischemia or underlying injury pattern.  Compared to EKG 04/02/2020, no significant change.  EKG 03/24/2020: Underlying sinus rhythm with Mobitz 2 AV block, 3-1 block.  Left axis deviation.  Cannot exclude lateral infarct old.  Marked LVH.  Assessment     ICD-10-CM   1. Essential hypertension  I10 EKG 12-Lead    2. Hypercholesterolemia  E78.00     3. Mobitz type 2 second degree AV block  I44.1        Medications Discontinued During This Encounter  Medication Reason   ezetimibe (ZETIA) 10 MG tablet Reorder    Meds ordered this encounter  Medications   ezetimibe (ZETIA) 10 MG tablet    Sig: Take 1 tablet (10 mg total) by mouth daily.    Dispense:  90 tablet    Refill:  3    Recommendations:   Danielle Mullins is a 60 y.o. African-American female with hypertension, hyperlipidemia, diabetes mellitus, obstructive sleep apnea presently not using CPAP, morbid obesity with BMI of 41 in 2018 and has lost about 30 to 40 pounds over the past 3 years. She presented to the hospital on 03/24/20 with dysnea and chest pain, EKG revealed Mobitz 2 AV block, she was recommended pacemaker put opted not to have  implantation done.   Patient presents for 59-month follow-up.  At last office visit patient was stable from a cardiovascular standpoint, therefore no changes were made.  Patient's blood  pressure remains well controlled and she is relatively asymptomatic from a cardiovascular standpoint.  Given that she has had no dizziness, syncope, near syncope, or chest pain we will continue to hold off on reconsideration of pacemaker implant at this time.  Offered patient referral to sleep provider given history of sleep apnea on CPAP, however she does not wish to proceed with this at this time.  I have requested recent labs from PCP to reevaluate lipid control.  She is tolerating Zetia and simvastatin, will continue this.  Otherwise patient is stable from a cardiovascular standpoint.  We will continue to avoid negative chronotropic agents.  Continue lisinopril and hydrochlorothiazide.  Follow-up in 6 months, sooner if needed, for hypertension, hyperlipidemia, AV block.   Danielle Berthold, PA-C 11/06/2020, 1:31 PM Office: [phone]

## 2020-11-06 ENCOUNTER — Encounter: Payer: Self-pay | Admitting: Student

## 2020-11-06 ENCOUNTER — Ambulatory Visit: Payer: BC Managed Care – PPO | Admitting: Student

## 2020-11-06 ENCOUNTER — Other Ambulatory Visit: Payer: Self-pay

## 2020-11-06 VITALS — BP 129/60 | HR 62 | Temp 97.7°F | Resp 16 | Ht 67.0 in | Wt 237.4 lb

## 2020-11-06 DIAGNOSIS — E7800 Pure hypercholesterolemia, unspecified: Secondary | ICD-10-CM

## 2020-11-06 DIAGNOSIS — I10 Essential (primary) hypertension: Secondary | ICD-10-CM

## 2020-11-06 DIAGNOSIS — I441 Atrioventricular block, second degree: Secondary | ICD-10-CM

## 2020-11-06 MED ORDER — EZETIMIBE 10 MG PO TABS: 10.0000 mg | ORAL_TABLET | Freq: Every day | ORAL | 3 refills | Status: DC

## 2020-11-07 ENCOUNTER — Telehealth: Payer: Self-pay | Admitting: Student

## 2020-11-07 NOTE — Telephone Encounter (Signed)
External labs 09/09/2020: Total cholesterol 153, HDL 40, triglycerides 75, LDL 97 Glucose 137, BUN 13, creatinine 0.93, GFR >60, potassium 4.2, sodium 137, AST 14, ALT 8 Hgb 10.9, HCT 34.8, MCV 70.4, platelet 377 A1c 7.7%  I personally reviewed external labs, lipids are well controlled we will continue present therapy.

## 2021-05-06 NOTE — Progress Notes (Signed)
? ?Primary Physician/Referring:  Iona Beard, MD ? ?Patient ID: Danielle Mullins, female    DOB: 1960-05-30, 61 y.o.   MRN: [id number] ? ?Chief Complaint  ?Patient presents with  ? CONDUCTION DZ  ? HLD  ? Hypertension  ? ?HPI:   ? ?Danielle Mullins  is a 61 y.o. African-American female with hypertension, hyperlipidemia, diabetes mellitus, obstructive sleep apnea presently not using CPAP, morbid obesity with BMI of 41 in 2018 and has lost about 30 to 40 pounds over the past 3 years. Danielle Mullins presented to the hospital on 03/24/20 with dysnea and chest pain, EKG revealed Mobitz 2 AV block, Danielle Mullins was recommended pacemaker put opted not to have implantation done. ? ?Patient presents for 10-monthfollow-up.  Last office visit patient was stable from a cardiovascular standpoint, therefore no changes were made.  Patient remains asymptomatic.  Denies significant dyspnea, chest pain, dizziness, palpitations.  Denies orthopnea, PND, leg edema.  I reviewed external labs, lipids are well controlled. ? ?Notably patient is not using a CPAP and refuses referral to sleep provider for reevaluation of sleep apnea.  Danielle Mullins also continues to wish to hold off on further evaluation by electrophysiology as Danielle Mullins does not want pacemaker implantation done despite history of Mobitz type II AV block. ? ?Past Medical History:  ?Diagnosis Date  ? Diabetes mellitus without complication ([place]   ? Heart disease, unspecified e  ? enlarged heart chamber- patient has referral  ? Hypertension   ? ?History reviewed. No pertinent surgical history. ?Family History  ?Problem Relation Age of Onset  ? Diabetes Mother   ? Hypertension Mother   ? Heart disease Father   ? Hypertension Father   ? Diabetes Brother   ?  ?Social History  ? ?Tobacco Use  ? Smoking status: Never  ? Smokeless tobacco: Never  ?Substance Use Topics  ? Alcohol use: No  ?  Alcohol/week: 0.0 standard drinks  ? ?Marital Status: Divorced  ? ?ROS  ?Review of Systems  ?Cardiovascular:  Positive for dyspnea on  exertion (occasional, stable). Negative for chest pain, claudication, leg swelling, near-syncope, orthopnea, palpitations, paroxysmal nocturnal dyspnea and syncope.  ?Neurological:  Negative for dizziness.  ? ?Objective  ?Blood pressure 125/64, pulse 87, temperature 97.8 ?F (36.6 ?C), temperature source Temporal, resp. rate 17, height '5\' 7"'$  (1.702 m), weight 240 lb (108.9 kg), SpO2 98 %.  ? ?  05/07/2021  ?  2:36 PM 05/07/2021  ?  2:35 PM 11/06/2020  ?  1:03 PM  ?Vitals with BMI  ?Height  '5\' 7"'$  '5\' 7"'$   ?Weight  240 lbs 237 lbs 6 oz  ?BMI  37.58 37.17  ?Systolic [id number] ?Diastolic 64 78 60  ?Pulse 87 92 62  ?  ? Physical Exam ?Vitals reviewed.  ?Constitutional:   ?   Appearance: Danielle Mullins is obese.  ?Cardiovascular:  ?   Rate and Rhythm: Normal rate and regular rhythm.  ?   Pulses: Intact distal pulses.     ?     Carotid pulses are 2+ on the right side and 2+ on the left side. ?     Radial pulses are 2+ on the right side and 2+ on the left side.  ?     Popliteal pulses are 2+ on the right side and 2+ on the left side.  ?     Dorsalis pedis pulses are 2+ on the right side and 2+ on the left side.  ?     Posterior tibial pulses  are 1+ on the right side and 1+ on the left side.  ?   Heart sounds: S1 normal and S2 normal. No murmur heard. ?  No gallop.  ?   Comments: Femoral pulse difficult to evaluate due to patient body habitus. ?Pulmonary:  ?   Effort: Pulmonary effort is normal. No respiratory distress.  ?   Breath sounds: No wheezing, rhonchi or rales.  ?Musculoskeletal:  ?   Right lower leg: No edema.  ?   Left lower leg: No edema.  ?Neurological:  ?   Mental Status: Danielle Mullins is alert.  ? ?Laboratory examination:  ? ?No results for input(s): NA, K, CL, CO2, GLUCOSE, BUN, CREATININE, CALCIUM, GFRNONAA, GFRAA in the last 8760 hours. ? ?CrCl cannot be calculated (Patient's most recent lab result is older than the maximum 21 days allowed.).  ? ?  Latest Ref Rng & Units 03/24/2020  ?  6:43 PM 03/24/2020  ? 11:33 AM  ?CMP  ?Glucose 70  - 99 mg/dL  124    ?BUN 6 - 20 mg/dL  7    ?Creatinine 0.44 - 1.00 mg/dL 0.93   0.86    ?Sodium 135 - 145 mmol/L  138    ?Potassium 3.5 - 5.1 mmol/L  3.8    ?Chloride 98 - 111 mmol/L  101    ?CO2 22 - 32 mmol/L  27    ?Calcium 8.9 - 10.3 mg/dL  9.0    ? ? ?  Latest Ref Rng & Units 03/24/2020  ?  6:43 PM 03/24/2020  ? 11:33 AM  ?CBC  ?WBC 4.0 - 10.5 K/uL 4.7   3.2    ?Hemoglobin 12.0 - 15.0 g/dL 10.7   11.5    ?Hematocrit 36.0 - 46.0 % 33.4   38.6    ?Platelets 150 - 400 K/uL 375   413    ? ? ?Lipid Panel ?No results for input(s): CHOL, TRIG, LDLCALC, VLDL, HDL, CHOLHDL, LDLDIRECT in the last 8760 hours. ? ? ?HEMOGLOBIN A1C ?No results found for: HGBA1C, MPG ?TSH ?No results for input(s): TSH in the last 8760 hours. ? ? ?External labs:  ?09/09/2020: ?Total cholesterol 153, HDL 40, triglycerides 75, LDL 97 ?Glucose 137, BUN 13, creatinine 0.93, GFR >60, potassium 4.2, sodium 137, AST 14, ALT 8 ?Hgb 10.9, HCT 34.8, MCV 70.4, platelet 377 ?A1c 7.7% ? ?Allergies  ?No Known Allergies  ? ?Medications Prior to Visit:  ? ?Outpatient Medications Prior to Visit  ?Medication Sig Dispense Refill  ? cyclobenzaprine (FLEXERIL) 5 MG tablet Take 5 mg by mouth at bedtime.    ? ezetimibe (ZETIA) 10 MG tablet Take 1 tablet (10 mg total) by mouth daily. 90 tablet 3  ? gabapentin (NEURONTIN) 100 MG capsule Take 100 mg by mouth at bedtime.    ? ibuprofen (ADVIL) 800 MG tablet Take 800 mg by mouth 3 (three) times daily.    ? lisinopril-hydrochlorothiazide (ZESTORETIC) 10-12.5 MG tablet Take 1 tablet by mouth daily.    ? metFORMIN (GLUCOPHAGE) 500 MG tablet Take 500 mg by mouth 3 (three) times daily with meals.    ? naproxen (NAPROSYN) 500 MG tablet Take 500 mg by mouth daily.    ? simvastatin (ZOCOR) 40 MG tablet Take 40 mg by mouth at bedtime.    ? ?No facility-administered medications prior to visit.  ? ?Final Medications at End of Visit   ? ?Current Meds  ?Medication Sig  ? cyclobenzaprine (FLEXERIL) 5 MG tablet Take 5 mg by mouth at  bedtime.  ?  ezetimibe (ZETIA) 10 MG tablet Take 1 tablet (10 mg total) by mouth daily.  ? gabapentin (NEURONTIN) 100 MG capsule Take 100 mg by mouth at bedtime.  ? ibuprofen (ADVIL) 800 MG tablet Take 800 mg by mouth 3 (three) times daily.  ? lisinopril-hydrochlorothiazide (ZESTORETIC) 10-12.5 MG tablet Take 1 tablet by mouth daily.  ? metFORMIN (GLUCOPHAGE) 500 MG tablet Take 500 mg by mouth 3 (three) times daily with meals.  ? naproxen (NAPROSYN) 500 MG tablet Take 500 mg by mouth daily.  ? simvastatin (ZOCOR) 40 MG tablet Take 40 mg by mouth at bedtime.  ? ?Radiology:  ? ?No results found. ?MR cardiac morphology with and without contrast 03/25/2020: ?MPRESSION: 1.  Study limited, patient unable to complete due to claustrophobia. 2.  Normal LV size and wall thickness, EF 65%. 3.  Normal RV size and systolic function, EF 97%. 4. No myocardial LGE, so no definitive evidence for prior MI, infiltrative disease or myocarditis. ?  ?DG Chest 2 View Result Date: 03/24/2020 ?CLINICAL DATA:  Chest pain. EXAM: CHEST - 2 VIEW COMPARISON:  None. FINDINGS: The heart size and mediastinal contours are within normal limits. Both lungs are clear. No pneumothorax or pleural effusion is noted. The visualized skeletal structures are unremarkable. IMPRESSION: No active cardiopulmonary disease. ?Cardiac Studies:  ? ?Exercise tetrofosmin stress test  04/21/2020: ?Normal ECG stress. The patient exercised for 5 minutes and 0 seconds of a Bruce protocol, achieving approximately 7.02 METs. Baseline heart rate was 70 bpm. A maximum heart rate of 150 beats per minute was achieved, which is 94% of the maximum predicted heart rate response.  No heart block and stress EKG negative for ischemia. No chest pain. Normal BP response.  ?There is a small sized mild reversible defect in the inferior region. noted superimposed on breast tissue attenuation. ?Overall LV systolic function is normal without regional wall motion abnormalities. Stress LV EF:  60-65%.  ?No previous exam available for comparison. Low risk study.  ? ?Echocardiogram 03/24/2020:   ?Patient appears to be in Mobitz II AV block. Left ventricular ejection fraction, by estimation, is 60 to

## 2021-05-07 ENCOUNTER — Ambulatory Visit: Payer: BC Managed Care – PPO | Admitting: Student

## 2021-05-07 ENCOUNTER — Encounter: Payer: Self-pay | Admitting: Student

## 2021-05-07 VITALS — BP 125/64 | HR 87 | Temp 97.8°F | Resp 17 | Ht 67.0 in | Wt 240.0 lb

## 2021-05-07 DIAGNOSIS — E7800 Pure hypercholesterolemia, unspecified: Secondary | ICD-10-CM

## 2021-05-07 DIAGNOSIS — I10 Essential (primary) hypertension: Secondary | ICD-10-CM

## 2021-05-07 DIAGNOSIS — I441 Atrioventricular block, second degree: Secondary | ICD-10-CM

## 2021-08-22 IMAGING — MR MR CARD MORPHOLOGY WO/W CM
45 of 48 series · 45 of 48 positions shown · IV contrast (gadavist)
Comparison: none

CLINICAL DATA: Heart block of uncertain etiology

EXAM:
CARDIAC MRI
TECHNIQUE: The patient was scanned on a 1.5 Tesla GE magnet. A dedicated
cardiac coil was used. Functional imaging was done using Fiesta
sequences. [DATE], and 4 chamber views were done to assess for RWMA's.
Modified Aria rule using a short axis stack was used to
calculate an ejection fraction on a dedicated work station using
Circle software. The patient received 8 cc of Gadavist. After 10
minutes inversion recovery sequences were used to assess for
infiltration and scar tissue.

[Series 4: [id number]_bh · axial · 8.0mm · 1.41mm/px · 1 of 16 slices shown]
[im 1/16]
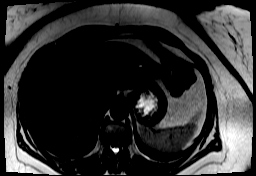

[Series 8: bSSFP · oblique · 8.0mm · 1.61mm/px · 1 of 25 slices shown (1 of 19)]
[im 1/25]
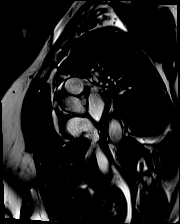

[Series 9: bSSFP · oblique · 8.0mm · 1.61mm/px · 1 of 25 slices shown (2 of 19)]
[im 1/25]
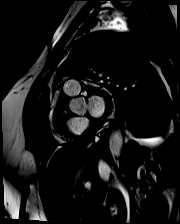

[Series 10: bSSFP · oblique · 8.0mm · 1.61mm/px · 1 of 25 slices shown (3 of 19)]
[im 1/25]
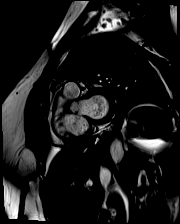

[Series 11: bSSFP · oblique · 8.0mm · 1.61mm/px · 1 of 25 slices shown (4 of 19)]
[im 1/25]
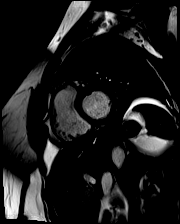

[Series 12: bSSFP · oblique · 8.0mm · 1.61mm/px · 1 of 25 slices shown (5 of 19)]
[im 1/25]
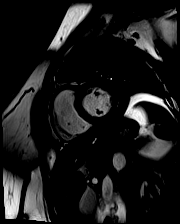

[Series 13: bSSFP · oblique · 8.0mm · 1.61mm/px · 1 of 25 slices shown (6 of 19)]
[im 1/25]
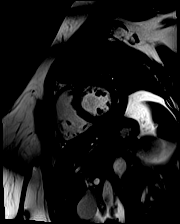

[Series 14: bSSFP · oblique · 8.0mm · 1.61mm/px · 1 of 25 slices shown (7 of 19)]
[im 1/25]
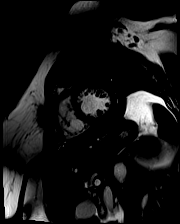

[Series 15: bSSFP · oblique · 8.0mm · 1.61mm/px · 1 of 25 slices shown (8 of 19)]
[im 1/25]
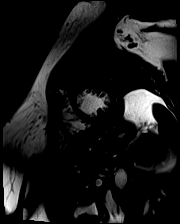

[Series 16: bSSFP · oblique · 8.0mm · 1.61mm/px · 1 of 25 slices shown (9 of 19)]
[im 1/25]
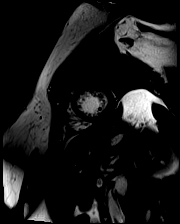

[Series 17: bSSFP · oblique · 8.0mm · 1.61mm/px · 1 of 25 slices shown (10 of 19)]
[im 1/25]
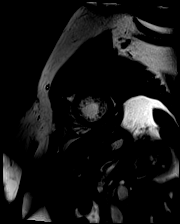

[Series 18: bSSFP · oblique · 8.0mm · 1.61mm/px · 1 of 25 slices shown (11 of 19)]
[im 1/25]
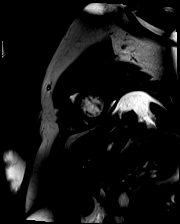

[Series 19: bSSFP · oblique · 8.0mm · 1.61mm/px · 1 of 25 slices shown (12 of 19)]
[im 1/25]
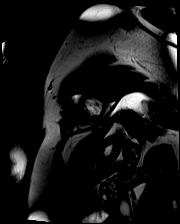

[Series 20: bSSFP · oblique · 8.0mm · 1.61mm/px · 1 of 25 slices shown (13 of 19)]
[im 1/25]
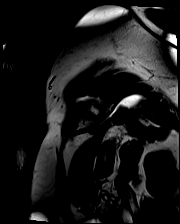

[Series 21: bSSFP · oblique · 8.0mm · 1.61mm/px · 1 of 25 slices shown (14 of 19)]
[im 1/25]
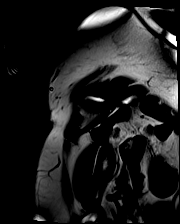

[Series 22: bSSFP · oblique · 8.0mm · 1.61mm/px · 1 of 25 slices shown (15 of 19)]
[im 1/25]
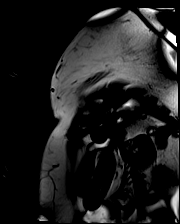

[Series 23: bSSFP · oblique · 8.0mm · 1.61mm/px · 1 of 25 slices shown (16 of 19)]
[im 1/25]
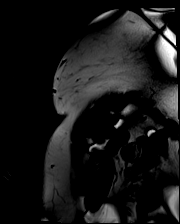

[Series 24: cine_trufi_cs_rt_short axis · oblique · 8.0mm · 1.73mm/px · 1 of 49 slices shown (1 of 18)]
[im 1/49]
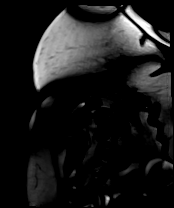

[Series 24: cine_trufi_cs_rt_short axis · oblique · 8.0mm · 1.73mm/px · 1 of 49 slices shown (2 of 18)]
[im 1/49]
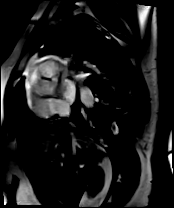

[Series 24: cine_trufi_cs_rt_short axis · oblique · 8.0mm · 1.73mm/px · 1 of 49 slices shown (3 of 18)]
[im 1/49]
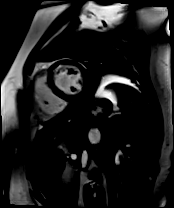

[Series 24: cine_trufi_cs_rt_short axis · oblique · 8.0mm · 1.73mm/px · 1 of 49 slices shown (4 of 18)]
[im 1/49]
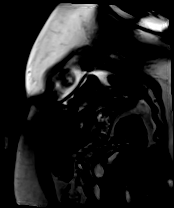

[Series 24: cine_trufi_cs_rt_short axis · oblique · 8.0mm · 1.73mm/px · 1 of 49 slices shown (5 of 18)]
[im 1/49]
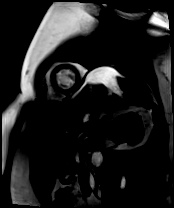

[Series 24: cine_trufi_cs_rt_short axis · oblique · 8.0mm · 1.73mm/px · 1 of 49 slices shown (6 of 18)]
[im 1/49]
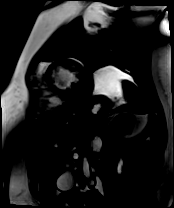

[Series 24: cine_trufi_cs_rt_short axis · oblique · 8.0mm · 1.73mm/px · 1 of 49 slices shown (7 of 18)]
[im 1/49]
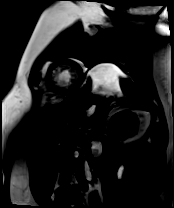

[Series 24: cine_trufi_cs_rt_short axis · oblique · 8.0mm · 1.73mm/px · 1 of 49 slices shown (8 of 18)]
[im 1/49]
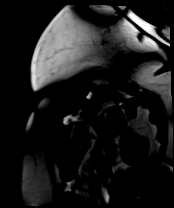

[Series 24: cine_trufi_cs_rt_short axis · oblique · 8.0mm · 1.73mm/px · 1 of 49 slices shown (9 of 18)]
[im 1/49]
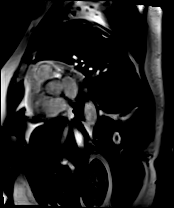

[Series 24: cine_trufi_cs_rt_short axis · oblique · 8.0mm · 1.73mm/px · 1 of 49 slices shown (10 of 18)]
[im 1/49]
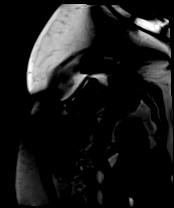

[Series 24: cine_trufi_cs_rt_short axis · oblique · 8.0mm · 1.73mm/px · 1 of 49 slices shown (11 of 18)]
[im 1/49]
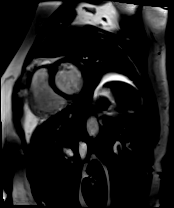

[Series 24: cine_trufi_cs_rt_short axis · oblique · 8.0mm · 1.73mm/px · 1 of 49 slices shown (12 of 18)]
[im 1/49]
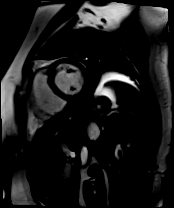

[Series 24: cine_trufi_cs_rt_short axis · oblique · 8.0mm · 1.73mm/px · 1 of 49 slices shown (13 of 18)]
[im 1/49]
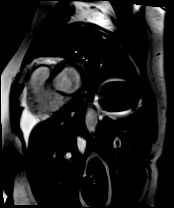

[Series 24: cine_trufi_cs_rt_short axis · oblique · 8.0mm · 1.73mm/px · 1 of 49 slices shown (14 of 18)]
[im 1/49]
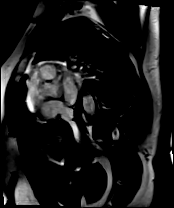

[Series 24: cine_trufi_cs_rt_short axis · oblique · 8.0mm · 1.73mm/px · 1 of 49 slices shown (15 of 18)]
[im 1/49]
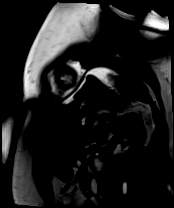

[Series 24: cine_trufi_cs_rt_short axis · oblique · 8.0mm · 1.73mm/px · 1 of 49 slices shown (16 of 18)]
[im 1/49]
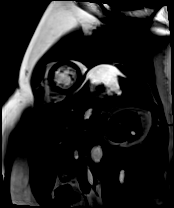

[Series 24: cine_trufi_cs_rt_short axis · oblique · 8.0mm · 1.73mm/px · 1 of 49 slices shown (17 of 18)]
[im 1/49]
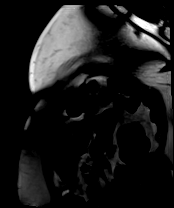

[Series 24: cine_trufi_cs_rt_short axis · oblique · 8.0mm · 1.73mm/px · 1 of 49 slices shown (18 of 18)]
[im 1/49]
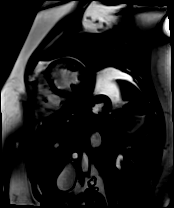

[Series 25: bSSFP · oblique · 6.0mm · 1.48mm/px · 1 of 25 slices shown (17 of 19)]
[im 1/25]
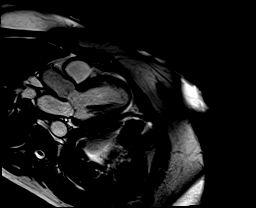

[Series 26: bSSFP · oblique · 6.0mm · 1.41mm/px · 1 of 25 slices shown (18 of 19)]
[im 1/25]
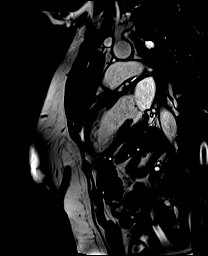

[Series 27: bSSFP · axial · 6.0mm · 1.41mm/px · 1 of 25 slices shown (19 of 19)]
[im 1/25]
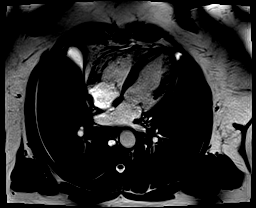

[Series 28: (id)_long_t1 · oblique · 8.0mm · 1.56mm/px · 1 of 24 slices shown]
[im 1/24]
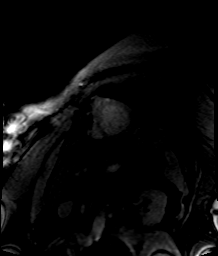

[Series 29: (id)_long_t1_moco · oblique · 8.0mm · 1.56mm/px · 1 of 24 slices shown]
[im 1/24]
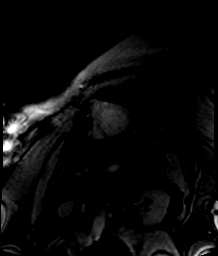

[Series 30: (id)_long_t1_moco_t1 · oblique · 8.0mm · 1.56mm/px · 1 of 6 slices shown]
[im 1/6]
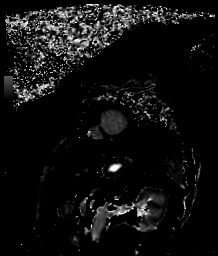

[Series 32: (id)_trufi · oblique · 8.0mm · 2.08mm/px · 1 of 9 slices shown]
[im 1/9]
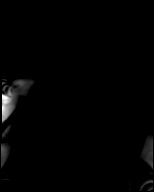

[Series 33: (id)_trufi_moco · oblique · 8.0mm · 2.08mm/px · 1 of 9 slices shown]
[im 1/9]
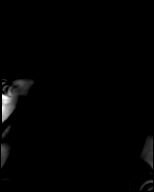

[Series 34: (id)_trufi_moco_t2 · oblique · 8.0mm · 2.08mm/px · 1 of 3 slices shown]
[im 1/3]
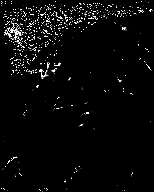

[Series 36: pre short axis · oblique · non-contrast · 8.0mm · 2.25mm/px · 1 of 10 slices shown]
[im 1/10]
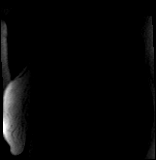

[45 of 48 positions shown; findings below may reference images not displayed]

FINDINGS: Incomplete images, patient unable to tolerate full study due to
anxiety/claustrophobia.

Limited images of the lung fields showed no gross abnormalities.

Normal left ventricular size and wall thickness. Normal wall motion
with LV EF 65%. Normal right ventricular size and systolic function,
EF 57%. Normal left and right atrial sizes. Trileaflet aortic valve
with no stenosis or regurgitation. No significant mitral
regurgitation.

On delayed enhancement images, there was no myocardial late
gadolinium enhancement (LGE).

Measurements:

LVEDV 159 mL

LVSV 103 mL
LVEF 65%

RVEDV 178 mL
RVSV 102 mL
RVEF 57%
IMPRESSION: 1.  Study limited, patient unable to complete due to claustrophobia.

2.  Normal LV size and wall thickness, EF 65%.

3.  Normal RV size and systolic function, EF 57%.

4. No myocardial LGE, so no definitive evidence for prior MI,
infiltrative disease or myocarditis.

Stanford Lucky

## 2022-05-02 ENCOUNTER — Ambulatory Visit (HOSPITAL_COMMUNITY)
Admission: EM | Admit: 2022-05-02 | Discharge: 2022-05-02 | Disposition: A | Discharge status: Home / Self Care | Payer: BC Managed Care – PPO

## 2022-05-02 ENCOUNTER — Encounter (HOSPITAL_COMMUNITY): Payer: Self-pay

## 2022-05-02 DIAGNOSIS — M62831 Muscle spasm of calf: Secondary | ICD-10-CM

## 2022-05-02 DIAGNOSIS — M79605 Pain in left leg: Secondary | ICD-10-CM

## 2022-05-02 MED ORDER — CYCLOBENZAPRINE HCL 5 MG PO TABS: 5.0000 mg | ORAL_TABLET | Freq: Every day | ORAL | 0 refills | Status: DC

## 2022-05-02 NOTE — Discharge Instructions (Signed)
Your pain is likely due to a muscle strain which will improve on its own with time.   - You may take ibuprofen 600mg  every 6 hours and/or tylenol 1,000mg  every 6 hours as needed for aches and pains due to muscle spasm. - You may also take the prescribed muscle relaxer as directed as needed for muscle aches/spasm.  Do not take this medication and drive or drink alcohol as it can make you sleepy.  Mainly use this medicine at nighttime as needed. - Apply heat 20 minutes on then 20 minutes off and perform gentle range of motion exercises to the area of greatest pain to prevent muscle stiffness and provide further pain relief.   Call to schedule an appointment with your primary care provider to discuss possible physical therapy. Purchase new shoe inserts for your work shoes as I believe this will help with the muscle pain further.  Red flag symptoms to watch out for are numbness/tingling to the legs, weakness, loss of bowel/bladder control, and/or worsening pain that does not respond well to medicines. Follow-up with your primary care provider or return to urgent care if your symptoms do not improve in the next 3 to 4 days with medications and interventions recommended today. If your symptoms are severe (red flag), please go to the emergency room.  I hope you feel better!

## 2022-05-02 NOTE — ED Triage Notes (Signed)
Started "about a week ago". "Left lower leg, back of leg very painful and hurting non stop with occasional numbness". While in waiting area "pain wen to thigh, same leg, and still occasional numbness". No injury.

## 2022-05-02 NOTE — ED Provider Notes (Signed)
[facility]    CSN: [id number] Arrival date & time: 05/02/22  1120      History   Chief Complaint Chief Complaint  Patient presents with   Leg Pain    Left     HPI Danielle Mullins is a 62 y.o. female.   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 Pain maily to the left calf Some to the left upper thigh Worse with movement Applying heat Taking ibuprofen Heat helps somewhat temproarily Wears shoe inserts but  hasn't changed in months Using licodaine piatch  Normal ROM and stregnth  No recent falls    Leg Pain   Past Medical History:  Diagnosis Date   Diabetes mellitus without complication ([place])    Heart disease, unspecified e   enlarged heart chamber- patient has referral   Hypertension     Patient Active Problem List   Diagnosis Date Noted   Symptomatic bradycardia 03/24/2020   Mobitz type 2 second degree AV block 03/24/2020   Dyspnea on exertion    Musculoskeletal chest pain    Porokeratosis 01/13/2015   Plantar fasciitis 01/13/2015   Sesamoiditis 01/13/2015   Ingrown toenail 01/13/2015    History reviewed. No pertinent surgical history.  OB History   No obstetric history on file.      Home Medications    Prior to Admission medications   Medication Sig Start Date End Date Taking? Authorizing Provider  gabapentin (NEURONTIN) 100 MG capsule Take 100 mg by mouth at bedtime.   Yes [provider]  ibuprofen (ADVIL) 800 MG tablet Take 800 mg by mouth 3 (three) times daily. Last dose: 8am. 05/03/21  Yes [provider]  lisinopril-hydrochlorothiazide (ZESTORETIC) 10-12.5 MG tablet Take 1 tablet by mouth daily. 03/13/20  Yes [provider]  metFORMIN (GLUCOPHAGE) 500 MG tablet Take 500 mg by mouth 3 (three) times daily with meals.   Yes [provider]  naproxen (NAPROSYN) 500 MG tablet Take 500 mg by mouth daily.   Yes [provider]  predniSONE (DELTASONE) 10 MG tablet Take by mouth. 04/13/22  Yes  [provider]  simvastatin (ZOCOR) 40 MG tablet Take 40 mg by mouth at bedtime. 03/14/20  Yes [provider]  traMADol (ULTRAM) 50 MG tablet Take 50 mg by mouth every 8 (eight) hours as needed. 04/22/22  Yes [provider]  cyclobenzaprine (FLEXERIL) 5 MG tablet Take 1 tablet (5 mg total) by mouth at bedtime. Use at bedtime as needed for muscle spasm. 05/02/22   Talbot Grumbling, FNP  ezetimibe (ZETIA) 10 MG tablet Take 1 tablet (10 mg total) by mouth daily. 11/06/20   Cantwell, Gerline Legacy, PA-C    Family History Family History  Problem Relation Age of Onset   Diabetes Mother    Hypertension Mother    Heart disease Father    Hypertension Father    Diabetes Brother     Social History Social History   Tobacco Use   Smoking status: Never   Smokeless tobacco: Never  Vaping Use   Vaping Use: Never used  Substance Use Topics   Alcohol use: No    Alcohol/week: 0.0 standard drinks of alcohol   Drug use: No     Allergies   Patient has no known allergies.   Review of Systems Review of Systems   Physical Exam Triage Vital Signs ED Triage Vitals  Enc Vitals Group     BP 05/02/22 1259 137/62     Pulse Rate 05/02/22 1259  78     Resp 05/02/22 1259 20     Temp 05/02/22 1259 98.1 F (36.7 C)     Temp Source 05/02/22 1259 Oral     SpO2 05/02/22 1259 99 %     Weight 05/02/22 1256 226 lb (102.5 kg)     Height 05/02/22 1256 5\' 6"  (1.676 m)     Head Circumference --      Peak Flow --      Pain Score 05/02/22 1255 10     Pain Loc --      Pain Edu? --      Excl. in [place]? --    No data found.  Updated Vital Signs BP 137/62 (BP Location: Left Arm)   Pulse 78   Temp 98.1 F (36.7 C) (Oral)   Resp 20   Ht 5\' 6"  (1.676 m)   Wt 226 lb (102.5 kg)   SpO2 99%   BMI 36.48 kg/m   Visual Acuity Right Eye Distance:   Left Eye Distance:   Bilateral Distance:    Right Eye Near:   Left Eye Near:    Bilateral Near:     Physical Exam   UC  Treatments / Results  Labs (all labs ordered are listed, but only abnormal results are displayed) Labs Reviewed - No data to display  EKG   Radiology No results found.  Procedures Procedures (including critical care time)  Medications Ordered in UC Medications - No data to display  Initial Impression / Assessment and Plan / UC Course  I have reviewed the triage vital signs and the nursing notes.  Pertinent labs & imaging results that were available during my care of the patient were reviewed by me and considered in my medical decision making (see chart for details).     *** Final Clinical Impressions(s) / UC Diagnoses   Final diagnoses:  Left leg pain  Muscle spasm of left calf     Discharge Instructions      Your pain is likely due to a muscle strain which will improve on its own with time.   - You may take ibuprofen 600mg  every 6 hours and/or tylenol 1,000mg  every 6 hours as needed for aches and pains due to muscle spasm. - You may also take the prescribed muscle relaxer as directed as needed for muscle aches/spasm.  Do not take this medication and drive or drink alcohol as it can make you sleepy.  Mainly use this medicine at nighttime as needed. - Apply heat 20 minutes on then 20 minutes off and perform gentle range of motion exercises to the area of greatest pain to prevent muscle stiffness and provide further pain relief.   Call to schedule an appointment with your primary care provider to discuss possible physical therapy. Purchase new shoe inserts for your work shoes as I believe this will help with the muscle pain further.  Red flag symptoms to watch out for are numbness/tingling to the legs, weakness, loss of bowel/bladder control, and/or worsening pain that does not respond well to medicines. Follow-up with your primary care provider or return to urgent care if your symptoms do not improve in the next 3 to 4 days with medications and interventions recommended  today. If your symptoms are severe (red flag), please go to the emergency room.  I hope you feel better!      ED Prescriptions     Medication Sig Dispense Auth. Provider   cyclobenzaprine (FLEXERIL) 5 MG tablet Take 1  tablet (5 mg total) by mouth at bedtime. Use at bedtime as needed for muscle spasm. 30 tablet Talbot Grumbling, FNP      PDMP not reviewed this encounter.

## 2022-05-10 ENCOUNTER — Ambulatory Visit: Payer: BC Managed Care – PPO | Admitting: Student

## 2022-05-21 ENCOUNTER — Encounter: Payer: Self-pay | Admitting: Physician Assistant

## 2022-05-21 ENCOUNTER — Ambulatory Visit (INDEPENDENT_AMBULATORY_CARE_PROVIDER_SITE_OTHER): Payer: BC Managed Care – PPO | Admitting: Physician Assistant

## 2022-05-21 ENCOUNTER — Other Ambulatory Visit (INDEPENDENT_AMBULATORY_CARE_PROVIDER_SITE_OTHER): Payer: BC Managed Care – PPO

## 2022-05-21 DIAGNOSIS — M79662 Pain in left lower leg: Secondary | ICD-10-CM | POA: Insufficient documentation

## 2022-05-21 DIAGNOSIS — M79605 Pain in left leg: Secondary | ICD-10-CM

## 2022-05-21 DIAGNOSIS — M5440 Lumbago with sciatica, unspecified side: Secondary | ICD-10-CM | POA: Diagnosis not present

## 2022-05-21 DIAGNOSIS — M7989 Other specified soft tissue disorders: Secondary | ICD-10-CM

## 2022-05-21 NOTE — Progress Notes (Signed)
Office Visit Note   Patient: Danielle Mullins           Date of Birth: 1960-05-10           MRN: [id number] Visit Date: 05/21/2022              Requested by: Mirna Mires, MD [address] [place],  [place] [address] PCP: Mirna Mires, MD   Assessment & Plan: Visit Diagnoses:  1. Acute bilateral low back pain with sciatica, sciatica laterality unspecified   2. Pain in left leg   3. Left leg swelling   4. Pain of left calf     Plan: Patient is a pleasant 62 year old woman with a 47-month history of left calf pain and spasm.  She denies any injury.  She denies any loss of bowel or bladder control.  She does have a history of some back arthritis.  She did have x-rays taken today which document facet arthritis at L4-5 L5-S1 and a slight listhesis at L4-5.  She does have swelling in her calf and has a 2 cm difference from the right calf measured in the same area.  Certainly the spasms and some of the numbness and tingling she is getting in her calf could be secondary to some lower at back issues however given the swelling in her calf and the pain focally in the calf I think it is important to rule out a DVT.  This is been going on for 6 months.  She will have the study on Monday.  She is got strong dorsalis pedis pulses no cellulitis.  I told her if the pain increased over the weekend she is which for sure go to the emergency room.  She does not have any respiratory symptoms.  If it is negative I would readdress her back as the cause of her pain she describes her pain in his left calf is moderate and occurs both at rest and with weightbearing  Follow-Up Instructions: Return Will call patient after ultrasound on Monday.   Orders:  Orders Placed This Encounter  Procedures   XR Lumbar Spine 2-3 Views   VAS [place] LOWER EXTREMITY VENOUS (DVT)   No orders of the defined types were placed in this encounter.     Procedures: No procedures performed   Clinical Data: No additional  findings.   Subjective: Chief Complaint  Patient presents with   Left Leg - Pain    HPI pleasant 62 year old woman with a 74-month history of left calf pain.  She denies any injury.  She was recently seen in the emergency room and thought she was having muscle spasm and she was prescribed muscle relaxants and tramadol.  She has found these to be ineffective.  She does describe some increasing numbing and tingling going into her calf.  Denies any shortness of breath loss of bowel or bladder control describes her pain as moderate occurs at rest and ambulating  Review of Systems  All other systems reviewed and are negative.    Objective: Vital Signs: There were no vitals taken for this visit.  Physical Exam Constitutional:      Appearance: Normal appearance.  Pulmonary:     Effort: Pulmonary effort is normal.  Skin:    General: Skin is warm and dry.  Neurological:     General: No focal deficit present.     Mental Status: She is alert.     Ortho Exam Examination patient appears well.  She has no  erythema in her calf but does appear swollen.  With measurements is 46 cm at the widest part of her calf on the left and 44 on the right.  She has good dorsiflexion plantarflexion eversion inversion.  Denna Haggard' sign is equivocal does reproduce slight amount of pain.  She has negative straight leg raise.  She has a strong dorsalis pedis pulse and her foot is warm with good capillary refill Specialty Comments:  No specialty comments available.  Imaging: XR Lumbar Spine 2-3 Views  Result Date: 05/21/2022 2 view radiographs of her lumbar spine were obtained today she does have some facet arthropathy.  Especially at L4-5 L5-S1.  She does have a slight listhesis at L4-5.  No acute fractures or acute changes noted    PMFS History: Patient Active Problem List   Diagnosis Date Noted   Pain of left calf 05/21/2022   Symptomatic bradycardia 03/24/2020   Mobitz type 2 second degree AV block  03/24/2020   Dyspnea on exertion    Musculoskeletal chest pain    Porokeratosis 01/13/2015   Plantar fasciitis 01/13/2015   Sesamoiditis 01/13/2015   Ingrown toenail 01/13/2015   Past Medical History:  Diagnosis Date   Diabetes mellitus without complication    Heart disease, unspecified e   enlarged heart chamber- patient has referral   Hypertension     Family History  Problem Relation Age of Onset   Diabetes Mother    Hypertension Mother    Heart disease Father    Hypertension Father    Diabetes Brother     History reviewed. No pertinent surgical history. Social History   Occupational History   Not on file  Tobacco Use   Smoking status: Never   Smokeless tobacco: Never  Vaping Use   Vaping Use: Never used  Substance and Sexual Activity   Alcohol use: No    Alcohol/week: 0.0 standard drinks of alcohol   Drug use: No   Sexual activity: Not Currently

## 2022-05-24 ENCOUNTER — Telehealth: Payer: Self-pay | Admitting: Physician Assistant

## 2022-05-24 ENCOUNTER — Ambulatory Visit (HOSPITAL_COMMUNITY)
Admission: RE | Admit: 2022-05-24 | Discharge: 2022-05-24 | Disposition: A | Discharge status: Home / Self Care | Payer: BC Managed Care – PPO | Source: Ambulatory Visit | Attending: Physician Assistant | Admitting: Physician Assistant

## 2022-05-24 ENCOUNTER — Other Ambulatory Visit: Payer: Self-pay | Admitting: Physician Assistant

## 2022-05-24 DIAGNOSIS — M79605 Pain in left leg: Secondary | ICD-10-CM | POA: Insufficient documentation

## 2022-05-24 DIAGNOSIS — M79662 Pain in left lower leg: Secondary | ICD-10-CM

## 2022-05-24 DIAGNOSIS — M5440 Lumbago with sciatica, unspecified side: Secondary | ICD-10-CM

## 2022-05-24 DIAGNOSIS — M7989 Other specified soft tissue disorders: Secondary | ICD-10-CM | POA: Insufficient documentation

## 2022-05-24 NOTE — Telephone Encounter (Signed)
Patient called in stating she would like to do an open MRI, on her last office notes it states patient is supposed to have a Ultrasound unsure if there is some confusion, please advise

## 2022-05-24 NOTE — Progress Notes (Signed)
VASCULAR LAB    Left lower extremity venous duplex has been performed.  See CV proc for preliminary results.  Called report to Elvera Lennox, Marte Celani, RVT 05/24/2022, 9:14 AM

## 2022-05-24 NOTE — Telephone Encounter (Signed)
Patient advising that she wants P/T and not an MRI. Please advise

## 2022-05-24 NOTE — Telephone Encounter (Signed)
Patient had doppler done this morning. She would like to proceed with PT for her back and leg. OK for referral? She is requesting a place that is open after 5:30p.

## 2022-05-24 NOTE — Telephone Encounter (Signed)
I spoke with patient. She had doppler done this morning. She is now requesting PT for the back and leg pain. Duplicate message in chart sent to [place] to advise.

## 2022-05-24 NOTE — Addendum Note (Signed)
Addended by: Rogers Seeds on: 05/24/2022 03:37 PM   Modules accepted: Orders

## 2022-05-24 NOTE — Telephone Encounter (Signed)
Referral entered.  Patient aware.  

## 2022-06-14 ENCOUNTER — Ambulatory Visit: Payer: BC Managed Care – PPO

## 2022-06-30 ENCOUNTER — Ambulatory Visit: Payer: BC Managed Care – PPO | Admitting: Physical Therapy

## 2022-12-09 ENCOUNTER — Other Ambulatory Visit: Payer: Self-pay | Admitting: Family Medicine

## 2022-12-09 DIAGNOSIS — R310 Gross hematuria: Secondary | ICD-10-CM

## 2022-12-14 ENCOUNTER — Ambulatory Visit
Admission: RE | Admit: 2022-12-14 | Discharge: 2022-12-14 | Disposition: A | Discharge status: Home / Self Care | Payer: BC Managed Care – PPO | Source: Ambulatory Visit | Attending: Family Medicine | Admitting: Family Medicine

## 2022-12-14 DIAGNOSIS — R310 Gross hematuria: Secondary | ICD-10-CM

## 2023-01-11 ENCOUNTER — Emergency Department (HOSPITAL_COMMUNITY): Payer: BC Managed Care – PPO

## 2023-01-11 ENCOUNTER — Other Ambulatory Visit (HOSPITAL_COMMUNITY): Payer: BC Managed Care – PPO

## 2023-01-11 ENCOUNTER — Inpatient Hospital Stay (HOSPITAL_COMMUNITY): Payer: BC Managed Care – PPO

## 2023-01-11 ENCOUNTER — Inpatient Hospital Stay (HOSPITAL_COMMUNITY)
Admission: EM | Admit: 2023-01-11 | Discharge: 2023-01-14 | DRG: 872 | Disposition: A | Discharge status: Home / Self Care | Payer: BC Managed Care – PPO | Attending: Internal Medicine | Admitting: Internal Medicine

## 2023-01-11 ENCOUNTER — Other Ambulatory Visit: Payer: Self-pay

## 2023-01-11 ENCOUNTER — Encounter (HOSPITAL_COMMUNITY): Payer: Self-pay | Admitting: Obstetrics & Gynecology

## 2023-01-11 DIAGNOSIS — Z6832 Body mass index (BMI) 32.0-32.9, adult: Secondary | ICD-10-CM | POA: Diagnosis not present

## 2023-01-11 DIAGNOSIS — Z79899 Other long term (current) drug therapy: Secondary | ICD-10-CM | POA: Diagnosis not present

## 2023-01-11 DIAGNOSIS — N858 Other specified noninflammatory disorders of uterus: Secondary | ICD-10-CM | POA: Diagnosis not present

## 2023-01-11 DIAGNOSIS — N179 Acute kidney failure, unspecified: Secondary | ICD-10-CM | POA: Diagnosis present

## 2023-01-11 DIAGNOSIS — Z7984 Long term (current) use of oral hypoglycemic drugs: Secondary | ICD-10-CM

## 2023-01-11 DIAGNOSIS — Z8249 Family history of ischemic heart disease and other diseases of the circulatory system: Secondary | ICD-10-CM | POA: Diagnosis not present

## 2023-01-11 DIAGNOSIS — R509 Fever, unspecified: Secondary | ICD-10-CM | POA: Diagnosis present

## 2023-01-11 DIAGNOSIS — A419 Sepsis, unspecified organism: Secondary | ICD-10-CM | POA: Diagnosis present

## 2023-01-11 DIAGNOSIS — E785 Hyperlipidemia, unspecified: Secondary | ICD-10-CM | POA: Diagnosis present

## 2023-01-11 DIAGNOSIS — N719 Inflammatory disease of uterus, unspecified: Secondary | ICD-10-CM | POA: Diagnosis present

## 2023-01-11 DIAGNOSIS — R6520 Severe sepsis without septic shock: Secondary | ICD-10-CM | POA: Diagnosis present

## 2023-01-11 DIAGNOSIS — Z833 Family history of diabetes mellitus: Secondary | ICD-10-CM | POA: Diagnosis not present

## 2023-01-11 DIAGNOSIS — E66811 Obesity, class 1: Secondary | ICD-10-CM | POA: Diagnosis present

## 2023-01-11 DIAGNOSIS — E1152 Type 2 diabetes mellitus with diabetic peripheral angiopathy with gangrene: Secondary | ICD-10-CM | POA: Diagnosis present

## 2023-01-11 DIAGNOSIS — G4733 Obstructive sleep apnea (adult) (pediatric): Secondary | ICD-10-CM | POA: Diagnosis present

## 2023-01-11 DIAGNOSIS — C539 Malignant neoplasm of cervix uteri, unspecified: Secondary | ICD-10-CM | POA: Diagnosis present

## 2023-01-11 DIAGNOSIS — I10 Essential (primary) hypertension: Secondary | ICD-10-CM | POA: Diagnosis present

## 2023-01-11 DIAGNOSIS — Z886 Allergy status to analgesic agent status: Secondary | ICD-10-CM | POA: Diagnosis not present

## 2023-01-11 DIAGNOSIS — D509 Iron deficiency anemia, unspecified: Secondary | ICD-10-CM | POA: Diagnosis present

## 2023-01-11 DIAGNOSIS — I959 Hypotension, unspecified: Secondary | ICD-10-CM | POA: Diagnosis present

## 2023-01-11 DIAGNOSIS — E119 Type 2 diabetes mellitus without complications: Secondary | ICD-10-CM

## 2023-01-11 HISTORY — DX: Benign neoplasm of connective and other soft tissue, unspecified: D21.9

## 2023-01-11 LAB — URINALYSIS, ROUTINE W REFLEX MICROSCOPIC
Bilirubin Urine: NEGATIVE
Glucose, UA: NEGATIVE mg/dL
Ketones, ur: 5 mg/dL — AB
Nitrite: NEGATIVE
Protein, ur: 100 mg/dL — AB
RBC / HPF: >50 RBC/hpf (ref 0–5)
Specific Gravity, Urine: 1.021 (ref 1.005–1.030)
WBC, UA: >50 WBC/hpf (ref 0–5)
pH: 5 (ref 5.0–8.0)

## 2023-01-11 LAB — COMPREHENSIVE METABOLIC PANEL
ALT: 15 U/L (ref 0–44)
AST: 24 U/L (ref 15–41)
Albumin: 3 g/dL — ABNORMAL LOW (ref 3.5–5.0)
Alkaline Phosphatase: 63 U/L (ref 38–126)
Anion gap: 15 (ref 5–15)
BUN: 24 mg/dL — ABNORMAL HIGH (ref 8–23)
CO2: 19 mmol/L — ABNORMAL LOW (ref 22–32)
Calcium: 9.2 mg/dL (ref 8.9–10.3)
Chloride: 99 mmol/L (ref 98–111)
Creatinine, Ser: 1.76 mg/dL — ABNORMAL HIGH (ref 0.44–1.00)
GFR, Estimated: 32 mL/min — ABNORMAL LOW (ref >60)
Glucose, Bld: 154 mg/dL — ABNORMAL HIGH (ref 70–99)
Potassium: 4.1 mmol/L (ref 3.5–5.1)
Sodium: 133 mmol/L — ABNORMAL LOW (ref 135–145)
Total Bilirubin: 0.9 mg/dL (ref <1.2)
Total Protein: 8.1 g/dL (ref 6.5–8.1)

## 2023-01-11 LAB — CBC WITH DIFFERENTIAL/PLATELET
Abs Immature Granulocytes: 0.08 10*3/uL — ABNORMAL HIGH (ref 0.00–0.07)
Basophils Absolute: 0 10*3/uL (ref 0.0–0.1)
Basophils Relative: 0 %
Eosinophils Absolute: 0.1 10*3/uL (ref 0.0–0.5)
Eosinophils Relative: 1 %
HCT: 35.2 % — ABNORMAL LOW (ref 36.0–46.0)
Hemoglobin: 10.4 g/dL — ABNORMAL LOW (ref 12.0–15.0)
Immature Granulocytes: 1 %
Lymphocytes Relative: 8 %
Lymphs Abs: 0.8 10*3/uL (ref 0.7–4.0)
MCH: 24.6 pg — ABNORMAL LOW (ref 26.0–34.0)
MCHC: 29.5 g/dL — ABNORMAL LOW (ref 30.0–36.0)
MCV: 83.4 fL (ref 80.0–100.0)
Monocytes Absolute: 1.1 10*3/uL — ABNORMAL HIGH (ref 0.1–1.0)
Monocytes Relative: 11 %
Neutro Abs: 7.7 10*3/uL (ref 1.7–7.7)
Neutrophils Relative %: 79 %
Platelets: 530 10*3/uL — ABNORMAL HIGH (ref 150–400)
RBC: 4.22 MIL/uL (ref 3.87–5.11)
RDW: 12.7 % (ref 11.5–15.5)
WBC: 9.8 10*3/uL (ref 4.0–10.5)
nRBC: 0.2 % (ref 0.0–0.2)

## 2023-01-11 LAB — WET PREP, GENITAL
Clue Cells Wet Prep HPF POC: NONE SEEN
Sperm: NONE SEEN
Trich, Wet Prep: NONE SEEN
WBC, Wet Prep HPF POC: >=10 — AB (ref <10)
Yeast Wet Prep HPF POC: NONE SEEN

## 2023-01-11 LAB — HIV ANTIBODY (ROUTINE TESTING W REFLEX): HIV Screen 4th Generation wRfx: NONREACTIVE

## 2023-01-11 LAB — LIPASE, BLOOD: Lipase: 29 U/L (ref 11–51)

## 2023-01-11 LAB — I-STAT CG4 LACTIC ACID, ED: Lactic Acid, Venous: 1.5 mmol/L (ref 0.5–1.9)

## 2023-01-11 MED ORDER — DOXYCYCLINE HYCLATE 100 MG IV SOLR
100.0000 mg | Freq: Two times a day (BID) | INTRAVENOUS | Status: DC
  Administered 2023-01-12 – 2023-01-13 (×3): 100 mg via INTRAVENOUS
  Filled 2023-01-11 (×5): qty 100

## 2023-01-11 MED ORDER — METRONIDAZOLE 500 MG/100ML IV SOLN
500.0000 mg | Freq: Two times a day (BID) | INTRAVENOUS | Status: DC
Start: 2023-01-12 — End: 2023-01-13
  Administered 2023-01-12 – 2023-01-13 (×3): 500 mg via INTRAVENOUS
  Filled 2023-01-11 (×3): qty 100

## 2023-01-11 MED ORDER — ACETAMINOPHEN 500 MG PO TABS
1000.0000 mg | ORAL_TABLET | Freq: Once | ORAL | Status: AC
  Administered 2023-01-11: 1000 mg via ORAL

## 2023-01-11 MED ORDER — METRONIDAZOLE 500 MG/100ML IV SOLN
500.0000 mg | Freq: Once | INTRAVENOUS | Status: AC
  Administered 2023-01-11: 500 mg via INTRAVENOUS
  Filled 2023-01-11: qty 100

## 2023-01-11 MED ORDER — ACETAMINOPHEN 500 MG PO TABS
1000.0000 mg | ORAL_TABLET | Freq: Four times a day (QID) | ORAL | Status: DC | PRN
  Administered 2023-01-12 – 2023-01-14 (×2): 1000 mg via ORAL
  Filled 2023-01-11 (×2): qty 2

## 2023-01-11 MED ORDER — CEFTRIAXONE SODIUM 1 G IJ SOLR: 1.0000 g | INTRAMUSCULAR | Status: DC

## 2023-01-11 MED ORDER — SODIUM CHLORIDE 0.9 % IV SOLN
1.0000 g | Freq: Once | INTRAVENOUS | Status: AC
  Administered 2023-01-11: 1 g via INTRAVENOUS
  Filled 2023-01-11: qty 10

## 2023-01-11 MED ORDER — LACTATED RINGERS IV BOLUS: 1000.0000 mL | Freq: Once | INTRAVENOUS | Status: DC

## 2023-01-11 MED ORDER — DOXYCYCLINE HYCLATE 100 MG IV SOLR
100.0000 mg | Freq: Once | INTRAVENOUS | Status: AC
  Administered 2023-01-12: 100 mg via INTRAVENOUS
  Filled 2023-01-11: qty 100

## 2023-01-11 MED ORDER — SODIUM CHLORIDE 0.9% FLUSH
3.0000 mL | Freq: Two times a day (BID) | INTRAVENOUS | Status: DC
  Administered 2023-01-11 – 2023-01-14 (×4): 3 mL via INTRAVENOUS

## 2023-01-11 MED ORDER — ACETAMINOPHEN 325 MG PO TABS: 650.0000 mg | ORAL_TABLET | Freq: Once | ORAL | Status: DC

## 2023-01-11 MED ORDER — SODIUM CHLORIDE 0.9 % IV BOLUS
1000.0000 mL | Freq: Once | INTRAVENOUS | Status: AC
  Administered 2023-01-11: 1000 mL via INTRAVENOUS

## 2023-01-11 NOTE — ED Provider Notes (Signed)
[place] EMERGENCY DEPARTMENT AT [facility] Provider Note   CSN: [id number] Arrival date & time: 01/11/23  1340     History {Add pertinent medical, surgical, social history, OB history to HPI:1} Chief Complaint  Patient presents with  . Vaginal Discharge  . Abdominal Pain  . Spasms  . Chills    Danielle Mullins is a 62 y.o. female.   Vaginal Discharge Associated symptoms: abdominal pain   Abdominal Pain Associated symptoms: vaginal discharge   62 year old female history of diabetes, hypertension presenting for abdominal pain, vaginal discharge.  She states she has had vaginal discharge for several months.  Has been slightly worse since Thanksgiving.  Associated mild bleeding.  She also has some muscle spasms in her lower abdomen and legs.  No back pain or radicular pain.  No weakness or numbness.  She states she is not sexually active and has not been anytime recently.  She is no concern for STI.  No nausea or vomiting.  No diarrhea.  Regular bowel movements.  She is otherwise been at her baseline health.     Home Medications Prior to Admission medications   Medication Sig Start Date End Date Taking? Authorizing Provider  cyclobenzaprine (FLEXERIL) 5 MG tablet Take 1 tablet (5 mg total) by mouth at bedtime. Use at bedtime as needed for muscle spasm. 05/02/22   Carlisle Beers, FNP  ezetimibe (ZETIA) 10 MG tablet Take 1 tablet (10 mg total) by mouth daily. 11/06/20   Cantwell, Celeste C, PA-C  gabapentin (NEURONTIN) 100 MG capsule Take 100 mg by mouth at bedtime.    [provider]  ibuprofen (ADVIL) 800 MG tablet Take 800 mg by mouth 3 (three) times daily. Last dose: 8am. 05/03/21   [provider]  lisinopril-hydrochlorothiazide (ZESTORETIC) 10-12.5 MG tablet Take 1 tablet by mouth daily. 03/13/20   [provider]  metFORMIN (GLUCOPHAGE) 500 MG tablet Take 500 mg by mouth 3 (three) times daily with meals.    [provider]   naproxen (NAPROSYN) 500 MG tablet Take 500 mg by mouth daily.    [provider]  predniSONE (DELTASONE) 10 MG tablet Take by mouth. 04/13/22   [provider]  simvastatin (ZOCOR) 40 MG tablet Take 40 mg by mouth at bedtime. 03/14/20   [provider]  traMADol (ULTRAM) 50 MG tablet Take 50 mg by mouth every 8 (eight) hours as needed. 04/22/22   [provider]      Allergies    Patient has no known allergies.    Review of Systems   Review of Systems  Gastrointestinal:  Positive for abdominal pain.  Genitourinary:  Positive for vaginal discharge.  Review of systems completed and notable as per HPI.  ROS otherwise negative.   Physical Exam Updated Vital Signs BP 115/71 (BP Location: Right Arm)   Pulse (!) 102   Temp (!) 101.1 F (38.4 C) (Oral)   Resp 19   Ht 5\' 6"  (1.676 m)   Wt 92.1 kg   SpO2 100%   BMI 32.77 kg/m  Physical Exam Vitals and nursing note reviewed.  Constitutional:      General: She is not in acute distress.    Appearance: She is well-developed.  HENT:     Head: Normocephalic and atraumatic.     Nose: Nose normal.     Mouth/Throat:     Mouth: Mucous membranes are moist.     Pharynx: Oropharynx is clear.  Eyes:     Extraocular  Movements: Extraocular movements intact.     Conjunctiva/sclera: Conjunctivae normal.     Pupils: Pupils are equal, round, and reactive to light.  Cardiovascular:     Rate and Rhythm: Normal rate and regular rhythm.     Pulses: Normal pulses.     Heart sounds: Normal heart sounds. No murmur heard. Pulmonary:     Effort: Pulmonary effort is normal. No respiratory distress.     Breath sounds: Normal breath sounds.  Abdominal:     Palpations: Abdomen is soft.     Tenderness: There is generalized abdominal tenderness and tenderness in the suprapubic area. There is no right CVA tenderness, left CVA tenderness, guarding or rebound.  Musculoskeletal:        General: No swelling.     Cervical back:  Neck supple.     Right lower leg: No edema.     Left lower leg: No edema.  Skin:    General: Skin is warm and dry.     Capillary Refill: Capillary refill takes less than 2 seconds.  Neurological:     Mental Status: She is alert.  Psychiatric:        Mood and Affect: Mood normal.     ED Results / Procedures / Treatments   Labs (all labs ordered are listed, but only abnormal results are displayed) Labs Reviewed  CBC WITH DIFFERENTIAL/PLATELET - Abnormal; Notable for the following components:      Result Value   Hemoglobin 10.4 (*)    HCT 35.2 (*)    MCH 24.6 (*)    MCHC 29.5 (*)    Platelets 530 (*)    Monocytes Absolute 1.1 (*)    Abs Immature Granulocytes 0.08 (*)    All other components within normal limits  COMPREHENSIVE METABOLIC PANEL - Abnormal; Notable for the following components:   Sodium 133 (*)    CO2 19 (*)    Glucose, Bld 154 (*)    BUN 24 (*)    Creatinine, Ser 1.76 (*)    Albumin 3.0 (*)    GFR, Estimated 32 (*)    All other components within normal limits  URINALYSIS, ROUTINE W REFLEX MICROSCOPIC - Abnormal; Notable for the following components:   Color, Urine AMBER (*)    APPearance TURBID (*)    Hgb urine dipstick LARGE (*)    Ketones, ur 5 (*)    Protein, ur 100 (*)    Leukocytes,Ua MODERATE (*)    Bacteria, UA RARE (*)    All other components within normal limits  WET PREP, GENITAL  HIV ANTIBODY (ROUTINE TESTING W REFLEX)  LIPASE, BLOOD  RPR  I-STAT CG4 LACTIC ACID, ED    EKG None  Radiology US PELVIC COMPLETE WITH TRANSVAGINAL  Result Date: 01/11/2023 CLINICAL DATA:  Vaginal discharge for 8 months EXAM: TRANSABDOMINAL AND TRANSVAGINAL ULTRASOUND OF PELVIS TECHNIQUE: Both transabdominal and transvaginal ultrasound examinations of the pelvis were performed. Transabdominal technique was performed for global imaging of the pelvis including uterus, ovaries, adnexal regions, and pelvic cul-de-sac. It was necessary to proceed with endovaginal  exam following the transabdominal exam to visualize the endometrium and adnexal structures. COMPARISON:  12/14/2022 FINDINGS: Uterus Measurements: 15.3 x 5.4 x 7.1 cm = volume: 380.4 mL. Uterus is enlarged with diffuse heterogeneity suggesting underlying fibroids. Enlarged mass is again identified within the lower uterine segment extending toward the cervix, measuring 7.8 x 6.0 by 7.2 cm. There is significant internal vascularity with central cystic areas possibly representing necrosis or venous lakes. Doppler  images suggest possible vascular stalk common this could reflect a prolapsed submucosal fibroid or endometrial polyp. Endometrium Not visualized. Right ovary Not visualized. Left ovary Not visualized. Other findings No free fluid. IMPRESSION: 1. 7.8 cm hypervascular mass within the lower uterine segment, extending toward the cervix, with evidence of a vascular stalk on Doppler imaging. Differential would include endometrial mass such as polyp versus pedunculated submucosal fibroid. Dedicated nonemergent pelvic MRI with and without contrast is recommended. 2. Nonvisualization of the ovaries and endometrium. Electronically Signed   By: Sharlet Salina M.D.   On: 01/11/2023 17:12    Procedures Procedures  {Document cardiac monitor, telemetry assessment procedure when appropriate:1}  Medications Ordered in ED Medications  acetaminophen (TYLENOL) tablet 1,000 mg (1,000 mg Oral Given 01/11/23 1829)    ED Course/ Medical Decision Making/ A&P Clinical Course as of 01/11/23 2309  Tue Jan 11, 2023  2148 Gynecology: Rocephin, then flagyl/doxy at home, needs resection, will see [JD]  2309 Discussed with hospitalist. [JD]    Clinical Course User Index [JD] Laurence Spates, MD   {   Click here for ABCD2, HEART and other calculatorsREFRESH Note before signing :1}                              Medical Decision Making Amount and/or Complexity of Data Reviewed Labs: ordered.  Risk Decision regarding  hospitalization.   Medical Decision Making:   Danielle Mullins is a 62 y.o. female who presented to the ED today with abdominal pain, vaginal discharge.  Here she is febrile, initially quite tachycardic although resolved to my evaluation.  She reports several months of vaginal discharge, will perform pelvic exam.  She already had a pelvic ultrasound which showed mass within the lower uterine segment which could be a fibroid but is quite vascular.  Her lab work is noted for normal lactic acid, no leukocytosis, mild anemia.  Her creatinine is 1.7 today which is about double her most recent value 2 years ago.  Urinalysis appears potentially infected as well although could be related to her discharge.    Pelvic exam with significant discharge.  She has small friable mass outside of the cervix as well.  No gross bleeding.  I reexamined her abdomen, she is not tender in the right lower quadrant, she is no leukocytosis but does have fever.  I suspect this is related to her vaginal discharge and this new mass, lower suspicion for appendicitis.  I talked with Dr. Macon Large with gynecology who evaluated the patient.  She obtained a sample of the tissue.  She   {crccomplexity:27900} Reviewed and confirmed nursing documentation for past medical history, family history, social history.  Reassessment and Plan:   ***    Patient's presentation is most consistent with {EM COPA:27473}     {Document critical care time when appropriate:1} {Document review of labs and clinical decision tools ie heart score, Chads2Vasc2 etc:1}  {Document your independent review of radiology images, and any outside records:1} {Document your discussion with family members, caretakers, and with consultants:1} {Document social determinants of health affecting pt's care:1} {Document your decision making why or why not admission, treatments were needed:1} Final Clinical Impression(s) / ED Diagnoses Final diagnoses:  None    Rx / DC  Orders ED Discharge Orders     None

## 2023-01-11 NOTE — ED Notes (Signed)
OB at bedside

## 2023-01-11 NOTE — ED Triage Notes (Signed)
Pt. Stated, I've had vaginal discharge and odor since Thanksgiving. Im also having muscle spasms especially in my legs.

## 2023-01-11 NOTE — Consult Note (Signed)
OBSTETRICS AND GYNECOLOGY ATTENDING CONSULT NOTE  Consult Date: 01/12/2023 Reason for Consult: Lower uterine segment mass, fevers, abdominal pain in a postmenopausal patient Consulting Provider: Dr. Marlene Bast    Assessment/Plan: Biopsies obtained from large, necrotic appearing , friable mass the occupying the entire upper vaginal vault area.  This specimen was walked down to pathology department for analysis, sent as RUSH specimen.  Will follow up results and manage accordingly.  There is always concern about a possible neoplastic process with any bleeding and uterine mass in any postmenopausal patient.  No emergent operative management indicated for now, will need results of pathology and possibly further imaging to help plan for surgical intervention. Patient's fever could be caused by infection of this mass, recommend treatment with PID regimen (Ceftriaxone, Doxycycline, Metronidazole).  Reassured that her her WBC and lactic acid levels were not elevated.  Her UA was equivocal, culture is pending, and these antibiotics will also cover any possible urinary pathogens. Concerned about her acute kidney injury (AKI), will defer management of this to the Internal Medicine team, as well as management of her HTN and T2DM. The recommended further imaging for preoperative planning is a MRI of the pelvis, but her AKI would preclude the use of contrast. When this improves, the MRI will be ordered if still needed at that point.  Appreciate this consult about Danielle Mullins.  We will follow along with patient. Surgical plans are not emergent, and will take some planning and more information, no need to be NPO for now. There is a possibility that this can be done after patient is stabilized and discharged.    Please call [phone] [facility] OB/GYN Consult Attending Monday-Friday 8am - 5pm) or [phone] [facility] OB/GYN Attending On Call all day, every day) for any gynecologic concerns at any time.  Thank  you for involving [place] in the care of this patient.  Total consultation time including face-to-face time with patient (>50% of time), reviewing chart and documentation: 60 minutes  Jaynie Collins, MD, FACOG Obstetrician & Gynecologist, [facility] for [employer], [facility]   History of Present Illness: Danielle Mullins is an 62 y.o. postmenopausal female with history of HTN, T2DM who presented to [facility] ER with report of lower abdominal pain, vaginal discharge for several months.  Symptoms got worse since Thanksgiving.  There was a pelvic ultrasound ordered on 12/14/22 for evaluation of hematuria, this showed fibroid uterus with 2.4 cm ovoid echogenic lesion mid uterus.  On exam today in ER, she was noted to have brown vaginal discharge and a concerning mass noted at the cerix.  Pelvic ultrasound showed remarkable differences compared to the scan on 12/14/22:  the uterus was 15-16 week sized and with fibroids, but the mass in the uterus measured  7.8 x 6.0 by 7.2 cm with significant internal vascularity with central cystic areas concerning for necrosis or venous lakes.  She was incidentally noted have a temperature of 101.1, but WBC was 9.8, Hgb 10.4, lactic acid was 1.5, wet prep negative.  BUN/Cr 24/1.76, consistent with AKI. Patient has not been seen by any GYN provider since 2017, was last seen by Dr. Clearance Coots at our [facility] office at that time.  Our service was consulted for further evaluation.  On encounter, patient is resting in bed comfortably.  Denies any vaginal bleeding, chills, sweats, dysuria, nausea, vomiting, other GI or GU symptoms or other general symptoms.   Pertinent OB/GYN History: No LMP recorded. Patient is postmenopausal. OB  History  No obstetric history on file.  Known fibroids, unsure of last pap. 2017 pap smear was inadequate, repeat was recommended but never done.    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  Family History  Problem Relation Age of Onset   Diabetes Mother    Hypertension Mother    Heart disease Father    Hypertension Father    Diabetes Brother     Social History:  reports that she has never smoked. She has never used smokeless tobacco. She reports that she does not drink alcohol and does not use drugs.  Allergies:  Allergies  Allergen Reactions   Celecoxib Swelling    Medications: I have reviewed the patient's current medications. Prior to Admission:  No current facility-administered medications on file prior to encounter.   Current Outpatient Medications on File Prior to Encounter  Medication Sig Dispense Refill   gabapentin (NEURONTIN) 100 MG capsule Take 100 mg by mouth at bedtime as needed.     Iron-FA-B Cmp-C-Biot-Probiotic (FUSION PLUS) CAPS Take 1 capsule by mouth daily.     lisinopril-hydrochlorothiazide (ZESTORETIC) 10-12.5 MG tablet Take 1 tablet by mouth daily.     metFORMIN (GLUCOPHAGE) 500 MG tablet Take 500 mg by mouth 2 (two) times daily with a meal.     RYBELSUS 7 MG TABS Take 1 tablet by mouth daily.     simvastatin (ZOCOR) 40 MG tablet Take 40 mg by mouth at bedtime.       Review of Systems: Pertinent items are noted in HPI.  Focused Physical Examination: Vitals:   01/11/23 1824 01/11/23 2255 01/11/23 2316 01/12/23 0027  BP: 115/71 111/63   (!) 91/59  Pulse: (!) 102 70  71  Resp: 19 18  15   Temp: (!) 101.1 F (38.4 C)  98.7 F (37.1 C) 98.4 F (36.9 C)  TempSrc: Oral  Oral Oral  SpO2: 100% 100%  99%  Weight:      Height:        CONSTITUTIONAL: Well-developed, well-nourished female in no acute distress.  NEUROLOGIC: Alert and oriented to person, place, and time. Normal reflexes, muscle tone coordination. No cranial nerve deficit noted. PSYCHIATRIC: Normal mood and affect. Normal behavior. Normal judgment and thought content. CARDIOVASCULAR: Normal heart rate noted, regular rhythm RESPIRATORY: Clear to auscultation bilaterally. Effort and breath sounds normal, no problems with respiration noted. ABDOMEN: Soft, normal bowel sounds, no distention noted.  No tenderness, rebound or guarding.  PELVIC: Normal appearing external genitalia; malodorous brown discharge noted at introitus. On speculum exam, normal appearing vaginal mucosa.  There was a friable, necrotic appearing large mass noted occupying upper vaginal vault, unable to visualize the entire mass or cervix.  Fragments of this tissue was taken for pathologic analysis; there was some bleeding while obtaining the specimens but this was small in  amount.  The exam was difficult due to patient's significant discomfort and was halted because of this. Done in the presence of  her ED RN as a chaperone. MUSCULOSKELETAL: Normal range of motion. No tenderness.  No cyanosis, clubbing, or edema.  2+ distal pulses.  Labs and Imaging: Results for orders placed or performed during the hospital encounter of 01/11/23 (from the past 72 hour(s))  HIV Antibody (routine testing w rflx)     Status: None   Collection Time: 01/11/23  2:27 PM  Result Value Ref Range   HIV Screen 4th Generation wRfx Non Reactive Non Reactive    Comment: Performed at [facility], [address], [place], [place] [address]  CBC with Differential     Status: Abnormal   Collection Time: 01/11/23  2:27 PM   Result Value Ref Range   WBC 9.8 4.0 - 10.5 K/uL   RBC 4.22 3.87 - 5.11 MIL/uL   Hemoglobin 10.4 (L) 12.0 - 15.0 g/dL   HCT 19.1 (L) 47.8 - 29.5 %   MCV 83.4 80.0 - 100.0 fL   MCH 24.6 (L) 26.0 - 34.0 pg   MCHC 29.5 (L) 30.0 - 36.0 g/dL   RDW 62.1 30.8 - 65.7 %   Platelets 530 (H) 150 - 400 K/uL    Comment: REPEATED TO VERIFY   nRBC 0.2 0.0 - 0.2 %   Neutrophils Relative % 79 %   Neutro Abs 7.7 1.7 - 7.7 K/uL   Lymphocytes Relative 8 %   Lymphs Abs 0.8 0.7 - 4.0 K/uL   Monocytes Relative 11 %   Monocytes Absolute 1.1 (H) 0.1 - 1.0 K/uL   Eosinophils Relative 1 %   Eosinophils Absolute 0.1 0.0 - 0.5 K/uL   Basophils Relative 0 %   Basophils Absolute 0.0 0.0 - 0.1 K/uL   Immature Granulocytes 1 %   Abs Immature Granulocytes 0.08 (H) 0.00 - 0.07 K/uL    Comment: Performed at [facility], [address] [place], [place] [address]  Comprehensive metabolic panel     Status: Abnormal   Collection Time: 01/11/23  2:27 PM  Result Value Ref Range   Sodium 133 (L) 135 - 145 mmol/L   Potassium 4.1 3.5 - 5.1 mmol/L   Chloride 99 98 - 111 mmol/L   CO2 19 (L) 22 - 32 mmol/L   Glucose, Bld 154 (H) 70 - 99 mg/dL    Comment: Glucose reference range applies only to samples taken after fasting for at least 8 hours.   BUN 24 (H) 8 - 23 mg/dL   Creatinine, Ser 2.95 (H) 0.44 - 1.00 mg/dL   Calcium 9.2 8.9 - 28.4 mg/dL   Total Protein 8.1 6.5 - 8.1 g/dL   Albumin 3.0 (L) 3.5 - 5.0 g/dL   AST 24 15 - 41 U/L   ALT 15 0 - 44 U/L   Alkaline Phosphatase 63 38 - 126 U/L   Total Bilirubin 0.9 <1.2 mg/dL   GFR, Estimated 32 (L) >60 mL/min    Comment: (NOTE) Calculated using the CKD-EPI Creatinine Equation (2021)    Anion gap 15 5 - 15    Comment: Performed at [facility], [address], [place], [place] [address]  Lipase, blood     Status: None   Collection Time: 01/11/23  2:27 PM  Result Value Ref Range   Lipase 29 11 - 51 U/L    Comment: Performed at [facility],  [address], [place], [place] [address]  Urinalysis, Routine w reflex microscopic -Urine, Clean Catch     Status: Abnormal   Collection Time: 01/11/23  2:27 PM  Result Value Ref Range   Color, Urine AMBER (A) YELLOW    Comment: BIOCHEMICALS MAY BE AFFECTED BY COLOR   APPearance TURBID (A) CLEAR   Specific Gravity, Urine 1.021 1.005 - 1.030   pH 5.0 5.0 - 8.0   Glucose, UA NEGATIVE NEGATIVE mg/dL   Hgb urine dipstick LARGE (A) NEGATIVE   Bilirubin Urine NEGATIVE NEGATIVE   Ketones, ur 5 (A) NEGATIVE mg/dL   Protein, ur 027 (A) NEGATIVE mg/dL   Nitrite NEGATIVE NEGATIVE   Leukocytes,Ua MODERATE (A) NEGATIVE   RBC / HPF >50 0 - 5 RBC/hpf   WBC, UA >50 0 - 5 WBC/hpf   Bacteria, UA RARE (A) NONE SEEN   Squamous Epithelial / HPF 0-5 0 - 5 /HPF   Hyaline Casts, UA PRESENT     Comment: Performed at [facility], [address] [place], [place] [address]  I-Stat Lactic Acid     Status: None   Collection Time: 01/11/23  2:46 PM  Result Value Ref Range   Lactic Acid, Venous 1.5 0.5 - 1.9 mmol/L  Wet prep, genital     Status: Abnormal   Collection Time: 01/11/23  9:25 PM  Result Value Ref Range   Yeast Wet Prep HPF POC NONE SEEN NONE SEEN   Trich, Wet Prep NONE SEEN NONE SEEN   Clue Cells Wet Prep HPF POC NONE SEEN NONE SEEN   WBC, Wet Prep HPF POC >=10 (A) <10   Sperm NONE SEEN     Comment: Performed at [facility], [address], [place], [place] [address]    US RENAL  Result Date: 01/12/2023 CLINICAL DATA:  Acute kidney injury EXAM: RENAL / URINARY TRACT ULTRASOUND COMPLETE COMPARISON:  12/14/2022 FINDINGS: Right Kidney: Renal measurements: 10.9 x 4.1 x 4.1 cm = volume: 95 mL. Echogenicity within normal limits. No mass or hydronephrosis visualized. Left Kidney: Renal measurements: 11.8 x 5.9 x 4.0 cm = volume: 148 mL. Echogenicity within normal limits. No mass or hydronephrosis visualized. Bladder: Appears normal for degree of bladder distention. Other: None. IMPRESSION:  Normal renal ultrasound. Electronically Signed   By: Charline Bills M.D.   On: 01/12/2023 00:27   DG CHEST PORT 1 VIEW  Result Date: 01/11/2023 CLINICAL DATA:  Sepsis EXAM: PORTABLE CHEST 1 VIEW COMPARISON:  03/24/2020 FINDINGS: The heart size and mediastinal contours are within normal limits. Both lungs are clear. The visualized skeletal structures are unremarkable. IMPRESSION: No active disease. Electronically Signed   By: Charlett Nose M.D.   On: 01/11/2023 23:46   US PELVIC COMPLETE WITH TRANSVAGINAL  Result Date: 01/11/2023 CLINICAL DATA:  Vaginal discharge for 8 months EXAM: TRANSABDOMINAL AND TRANSVAGINAL ULTRASOUND OF PELVIS TECHNIQUE: Both transabdominal and transvaginal ultrasound examinations of the pelvis were performed. Transabdominal technique was performed for global imaging of the pelvis including uterus, ovaries, adnexal regions, and pelvic cul-de-sac. It was necessary to proceed with endovaginal exam following the transabdominal exam to visualize the endometrium and adnexal structures. COMPARISON:  12/14/2022 FINDINGS: Uterus Measurements: 15.3 x 5.4 x 7.1 cm = volume: 380.4 mL. Uterus is enlarged with diffuse heterogeneity suggesting underlying fibroids. Enlarged mass is again identified within the lower uterine segment extending toward the cervix, measuring 7.8 x 6.0 by 7.2 cm. There is significant internal vascularity with central cystic areas possibly representing necrosis or  venous lakes. Doppler images suggest possible vascular stalk common this could reflect a prolapsed submucosal fibroid or endometrial polyp. Endometrium Not visualized. Right ovary Not visualized. Left ovary Not visualized. Other findings No free fluid. IMPRESSION: 1. 7.8 cm hypervascular mass within the lower uterine segment, extending toward the cervix, with evidence of a vascular stalk on Doppler imaging. Differential would include endometrial mass such as polyp versus pedunculated submucosal fibroid.  Dedicated nonemergent pelvic MRI with and without contrast is recommended. 2. Nonvisualization of the ovaries and endometrium. Electronically Signed   By: Sharlet Salina M.D.   On: 01/11/2023 17:12

## 2023-01-11 NOTE — ED Provider Triage Note (Signed)
Emergency Medicine Provider Triage Evaluation Note  Danielle Mullins , a 62 y.o. female  was evaluated in triage.  Pt complains of vaginal discharge with NV and chills. Grey vaginal discharge with bad odor since thanksgiving but began having chills with lower abd pain and NV. Denies constipation or diarrhea or flank pain. Unsure of fever.   Review of Systems  Positive:  Negative:   Physical Exam  BP 127/75 (BP Location: Right Arm)   Pulse (!) 115   Temp 97.9 F (36.6 C)   Resp 20   Ht 5\' 6"  (1.676 m)   Wt 92.1 kg   SpO2 98%   BMI 32.77 kg/m  Gen:   Awake, no distress   Resp:  Normal effort  MSK:   Moves extremities without difficulty  Other:  Suprapubic tenderness without peritoneal signs  Medical Decision Making  Medically screening exam initiated at 2:10 PM.  Appropriate orders placed.  [name] was informed that the remainder of the evaluation will be completed by another provider, this initial triage assessment does not replace that evaluation, and the importance of remaining in the ED until their evaluation is complete.  Workup initiated, pt declined nausea or pain meds at this time, pt stable.   Netta Corrigan, PA-C 01/11/23 [phone]

## 2023-01-12 ENCOUNTER — Inpatient Hospital Stay (HOSPITAL_COMMUNITY): Payer: BC Managed Care – PPO

## 2023-01-12 DIAGNOSIS — N858 Other specified noninflammatory disorders of uterus: Secondary | ICD-10-CM

## 2023-01-12 LAB — VITAMIN B12: Vitamin B-12: 369 pg/mL (ref 180–914)

## 2023-01-12 LAB — GLUCOSE, CAPILLARY
Glucose-Capillary: 98 mg/dL (ref 70–99)
Glucose-Capillary: 114 mg/dL — ABNORMAL HIGH (ref 70–99)
Glucose-Capillary: 131 mg/dL — ABNORMAL HIGH (ref 70–99)
Glucose-Capillary: 117 mg/dL — ABNORMAL HIGH (ref 70–99)

## 2023-01-12 LAB — GC/CHLAMYDIA PROBE AMP (~~LOC~~) NOT AT ARMC
Chlamydia: NEGATIVE
Comment: NEGATIVE
Comment: NORMAL
Neisseria Gonorrhea: NEGATIVE

## 2023-01-12 LAB — BASIC METABOLIC PANEL
Anion gap: 5 (ref 5–15)
BUN: 26 mg/dL — ABNORMAL HIGH (ref 8–23)
CO2: 24 mmol/L (ref 22–32)
Calcium: 8.2 mg/dL — ABNORMAL LOW (ref 8.9–10.3)
Chloride: 105 mmol/L (ref 98–111)
Creatinine, Ser: 1.8 mg/dL — ABNORMAL HIGH (ref 0.44–1.00)
GFR, Estimated: 31 mL/min — ABNORMAL LOW (ref >60)
Glucose, Bld: 131 mg/dL — ABNORMAL HIGH (ref 70–99)
Potassium: 3.5 mmol/L (ref 3.5–5.1)
Sodium: 134 mmol/L — ABNORMAL LOW (ref 135–145)

## 2023-01-12 LAB — PHOSPHORUS: Phosphorus: 3.4 mg/dL (ref 2.5–4.6)

## 2023-01-12 LAB — CBC
HCT: 24.2 % — ABNORMAL LOW (ref 36.0–46.0)
Hemoglobin: 7.8 g/dL — ABNORMAL LOW (ref 12.0–15.0)
MCH: 24.4 pg — ABNORMAL LOW (ref 26.0–34.0)
MCHC: 32.2 g/dL (ref 30.0–36.0)
MCV: 75.6 fL — ABNORMAL LOW (ref 80.0–100.0)
Platelets: 474 10*3/uL — ABNORMAL HIGH (ref 150–400)
RBC: 3.2 MIL/uL — ABNORMAL LOW (ref 3.87–5.11)
RDW: 12.6 % (ref 11.5–15.5)
WBC: 9.3 10*3/uL (ref 4.0–10.5)
nRBC: 0 % (ref 0.0–0.2)

## 2023-01-12 LAB — RPR: RPR Ser Ql: NONREACTIVE

## 2023-01-12 LAB — TRANSFERRIN: Transferrin: 144 mg/dL — ABNORMAL LOW (ref 192–382)

## 2023-01-12 LAB — TYPE AND SCREEN
ABO/RH(D): O POS
Antibody Screen: NEGATIVE

## 2023-01-12 LAB — PROTIME-INR
INR: 1.3 — ABNORMAL HIGH (ref 0.8–1.2)
Prothrombin Time: 16.6 s — ABNORMAL HIGH (ref 11.4–15.2)

## 2023-01-12 LAB — HEMOGLOBIN A1C
Hgb A1c MFr Bld: 6.3 % — ABNORMAL HIGH (ref 4.8–5.6)
Mean Plasma Glucose: 134.11 mg/dL

## 2023-01-12 LAB — IRON AND TIBC
Iron: 10 ug/dL — ABNORMAL LOW (ref 28–170)
Saturation Ratios: 5 % — ABNORMAL LOW (ref 10.4–31.8)
TIBC: 199 ug/dL — ABNORMAL LOW (ref 250–450)
UIBC: 189 ug/dL

## 2023-01-12 LAB — FERRITIN: Ferritin: 284 ng/mL (ref 11–307)

## 2023-01-12 LAB — MAGNESIUM: Magnesium: 1.7 mg/dL (ref 1.7–2.4)

## 2023-01-12 LAB — ABO/RH: ABO/RH(D): O POS

## 2023-01-12 LAB — LACTIC ACID, PLASMA: Lactic Acid, Venous: 1.3 mmol/L (ref 0.5–1.9)

## 2023-01-12 MED ORDER — SODIUM CHLORIDE 0.9 % IV SOLN: INTRAVENOUS | Status: AC

## 2023-01-12 MED ORDER — VANCOMYCIN HCL 1250 MG/250ML IV SOLN
1250.0000 mg | INTRAVENOUS | Status: DC
  Administered 2023-01-12: 1250 mg via INTRAVENOUS
  Filled 2023-01-12: qty 250

## 2023-01-12 MED ORDER — SIMVASTATIN 20 MG PO TABS
40.0000 mg | ORAL_TABLET | Freq: Every day | ORAL | Status: DC
  Administered 2023-01-12 – 2023-01-13 (×2): 40 mg via ORAL
  Filled 2023-01-12 (×2): qty 2

## 2023-01-12 MED ORDER — INSULIN ASPART 100 UNIT/ML IJ SOLN: 0.0000 [IU] | Freq: Three times a day (TID) | INTRAMUSCULAR | Status: DC

## 2023-01-12 MED ORDER — GABAPENTIN 100 MG PO CAPS
100.0000 mg | ORAL_CAPSULE | Freq: Every evening | ORAL | Status: DC | PRN
Start: 2023-01-12 — End: 2023-01-14
  Administered 2023-01-12: 100 mg via ORAL
  Filled 2023-01-12: qty 1

## 2023-01-12 MED ORDER — SODIUM CHLORIDE 0.9 % IV SOLN
2.0000 g | Freq: Two times a day (BID) | INTRAVENOUS | Status: DC
  Administered 2023-01-12: 2 g via INTRAVENOUS
  Filled 2023-01-12: qty 12.5

## 2023-01-12 MED ORDER — SODIUM CHLORIDE 0.9 % IV BOLUS
500.0000 mL | Freq: Once | INTRAVENOUS | Status: AC
  Administered 2023-01-12: 500 mL via INTRAVENOUS

## 2023-01-12 MED ORDER — SODIUM CHLORIDE 0.9 % IV SOLN
2.0000 g | INTRAVENOUS | Status: DC
  Administered 2023-01-12 – 2023-01-13 (×2): 2 g via INTRAVENOUS
  Filled 2023-01-12 (×2): qty 20

## 2023-01-12 MED ORDER — IRON SUCROSE 200 MG IVPB - SIMPLE MED
200.0000 mg | Freq: Once | Status: DC
  Filled 2023-01-12: qty 110

## 2023-01-12 MED ORDER — OXYCODONE HCL 5 MG PO TABS
5.0000 mg | ORAL_TABLET | ORAL | Status: DC | PRN
  Administered 2023-01-12 – 2023-01-13 (×2): 5 mg via ORAL
  Filled 2023-01-12 (×2): qty 1

## 2023-01-12 MED ORDER — SODIUM CHLORIDE 0.9 % IV BOLUS
1000.0000 mL | Freq: Once | INTRAVENOUS | Status: AC
  Administered 2023-01-12: 1000 mL via INTRAVENOUS

## 2023-01-12 MED ORDER — SODIUM CHLORIDE 0.9 % IV SOLN
200.0000 mg | Freq: Once | INTRAVENOUS | Status: AC
  Administered 2023-01-12: 200 mg via INTRAVENOUS
  Filled 2023-01-12: qty 10

## 2023-01-12 NOTE — Progress Notes (Signed)
Brief same day note:  Patient is a 62 year old female with history of hypertension, hyperlipidemia, OSA not on CPAP, spondylosis who presented with persistent vaginal discharge which has recently turned into yellow/orange.  Report of chills at home.  On presentation, she was febrile, hypotensive, tachycardic.  Work showed AKI with creatinine of 1.7.  Ultrasound of the pelvis showed 7.8 cm hypervascular mass within the lower uterine segment, extending toward the cervix. S/P biopsy. Started on broad-spectrum antibiotics.  GYN consulted.  Plan for MRI after improvement in the kidney function.  Patient seen and examined the bedside this afternoon.  During my evaluation, she was resting comfortably in bed.  Afebrile.  Denied any complaints.  Blood pressure stable   Assessment and plan:  Suspected infected uterine mass: Presented with vaginal discharge, febrile, tachycardic.  Found to have friable necrotic appearing mass of the upper vaginal vault.  Biopsied.  Ultrasound of the pelvis as above.  Currently on broad spec antibiotics, being treated for PID.  GYN following and recommending MRI after improvement in the kidney function. Will follow-up pathology.  Continue gentle IV fluids. Follow cultures  AKI: Baseline creatinine normal.  Presented with creatinine in the range of 1.7.  Continue IV fluids.  Renal ultrasound did not show any obstructive pathology  Iron deficiency anemia: Most likely from bleeding from the uterine mass.  Hemoglobin in the range of 7.  Iron studies showed low iron.  We will give  IV iron.  Hypertension: Currently blood pressure soft.  Home medications lisinopril, hydrochlorothiazide on hold  Diabetes type 2: On metformin at home.  Currently on sliding scale  Hyperlipidemia: Continue on simvastatin

## 2023-01-12 NOTE — Progress Notes (Signed)
Patient transferred to [facility] , gave report to the receiving RN and answered all her questions.

## 2023-01-12 NOTE — ED Notes (Signed)
Patient transported to Ultrasound 

## 2023-01-12 NOTE — ED Notes (Signed)
ED TO INPATIENT HANDOFF REPORT  ED Nurse Name and Phone #: Dwain Sarna RN [phone]  S Name/Age/Gender Danielle Mullins 62 y.o. female Room/Bed: 039C/039C  Code Status   Code Status: Full Code  Home/SNF/Other Home Patient oriented to: self, place, time, and situation Is this baseline? Yes   Triage Complete: Triage complete  Chief Complaint Sepsis [facility]) [A41.9]  Triage Note Pt. Stated, I've had vaginal discharge and odor since Thanksgiving. Im also having muscle spasms especially in my legs.   Allergies Allergies  Allergen Reactions   Celecoxib Swelling    Level of Care/Admitting Diagnosis ED Disposition     ED Disposition  Admit   Condition  --   Comment  Hospital Area: [facility] [[id number]]  Level of Care: Telemetry Medical [104]  May admit patient to [facility] or [facility] if equivalent level of care is available:: No  Covid Evaluation: Asymptomatic - no recent exposure (last 10 days) testing not required  Diagnosis: Sepsis [facility]) [[id number]]  Admitting Physician: Dolly Rias [[id number]]  Attending Physician: Dolly Rias [[id number]]  Certification:: I certify this patient will need inpatient services for at least 2 midnights  Expected Medical Readiness: 01/14/2023          B Medical/Surgery History Past Medical History:  Diagnosis Date   Diabetes mellitus without complication (HCC)    Fibroids    Heart disease, unspecified e   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   A IV Location/Drains/Wounds Patient Lines/Drains/Airways Status     Active Line/Drains/Airways     Name Placement date Placement time Site Days   Peripheral IV 01/11/23 20 G Left Antecubital 01/11/23   2202  Antecubital  1            Intake/Output Last 24 hours No intake or output data in the 24 hours ending 01/12/23 0137  Labs/Imaging Results for orders placed or performed during the hospital encounter of 01/11/23 (from the past 48 hour(s))  HIV Antibody (routine testing w rflx)     Status: None   Collection Time: 01/11/23  2:27 PM  Result Value Ref Range   HIV Screen 4th Generation wRfx Non Reactive Non Reactive    Comment: Performed at [facility], [address] [place], [place] [address]  CBC with Differential     Status: Abnormal   Collection Time: 01/11/23  2:27 PM  Result Value Ref Range   WBC 9.8 4.0 - 10.5 K/uL   RBC 4.22 3.87 - 5.11 MIL/uL   Hemoglobin 10.4 (L) 12.0 - 15.0 g/dL   HCT 46.9 (L) 62.9 - 52.8 %   MCV 83.4 80.0 - 100.0 fL   MCH 24.6 (L) 26.0 - 34.0 pg   MCHC 29.5 (L) 30.0 - 36.0 g/dL   RDW 41.3 24.4 - 01.0 %   Platelets 530 (H) 150 - 400 K/uL    Comment: REPEATED TO VERIFY   nRBC 0.2 0.0 - 0.2 %   Neutrophils Relative % 79 %   Neutro Abs 7.7 1.7 - 7.7 K/uL   Lymphocytes Relative 8 %   Lymphs Abs 0.8 0.7 - 4.0 K/uL   Monocytes Relative 11 %   Monocytes Absolute 1.1 (H) 0.1 - 1.0 K/uL  Eosinophils Relative 1 %   Eosinophils Absolute 0.1 0.0 - 0.5 K/uL   Basophils Relative 0 %   Basophils Absolute 0.0 0.0 - 0.1 K/uL   Immature Granulocytes 1 %   Abs Immature Granulocytes 0.08 (H) 0.00 - 0.07 K/uL    Comment: Performed at [facility], [address] [place], [place] [address]  Comprehensive metabolic panel     Status: Abnormal   Collection Time: 01/11/23  2:27 PM  Result Value Ref Range   Sodium 133 (L) 135 - 145 mmol/L   Potassium 4.1 3.5 - 5.1 mmol/L   Chloride 99 98 - 111 mmol/L   CO2 19 (L) 22 - 32 mmol/L   Glucose, Bld 154 (H) 70 - 99 mg/dL    Comment: Glucose reference range applies only to samples taken after fasting for at least 8 hours.   BUN 24 (H) 8 - 23 mg/dL   Creatinine, Ser 0.63 (H) 0.44 - 1.00 mg/dL   Calcium  9.2 8.9 - 01.6 mg/dL   Total Protein 8.1 6.5 - 8.1 g/dL   Albumin 3.0 (L) 3.5 - 5.0 g/dL   AST 24 15 - 41 U/L   ALT 15 0 - 44 U/L   Alkaline Phosphatase 63 38 - 126 U/L   Total Bilirubin 0.9 <1.2 mg/dL   GFR, Estimated 32 (L) >60 mL/min    Comment: (NOTE) Calculated using the CKD-EPI Creatinine Equation (2021)    Anion gap 15 5 - 15    Comment: Performed at [facility], [address] [place], [place] [address]  Lipase, blood     Status: None   Collection Time: 01/11/23  2:27 PM  Result Value Ref Range   Lipase 29 11 - 51 U/L    Comment: Performed at [facility], [address], [place], [place] [address]  Urinalysis, Routine w reflex microscopic -Urine, Clean Catch     Status: Abnormal   Collection Time: 01/11/23  2:27 PM  Result Value Ref Range   Color, Urine AMBER (A) YELLOW    Comment: BIOCHEMICALS MAY BE AFFECTED BY COLOR   APPearance TURBID (A) CLEAR   Specific Gravity, Urine 1.021 1.005 - 1.030   pH 5.0 5.0 - 8.0   Glucose, UA NEGATIVE NEGATIVE mg/dL   Hgb urine dipstick LARGE (A) NEGATIVE   Bilirubin Urine NEGATIVE NEGATIVE   Ketones, ur 5 (A) NEGATIVE mg/dL   Protein, ur 322 (A) NEGATIVE mg/dL   Nitrite NEGATIVE NEGATIVE   Leukocytes,Ua MODERATE (A) NEGATIVE   RBC / HPF >50 0 - 5 RBC/hpf   WBC, UA >50 0 - 5 WBC/hpf   Bacteria, UA RARE (A) NONE SEEN   Squamous Epithelial / HPF 0-5 0 - 5 /HPF   Hyaline Casts, UA PRESENT     Comment: Performed at [facility], [address] [place], [place] [address]  I-Stat Lactic Acid     Status: None   Collection Time: 01/11/23  2:46 PM  Result Value Ref Range   Lactic Acid, Venous 1.5 0.5 - 1.9 mmol/L  Wet prep, genital     Status: Abnormal   Collection Time: 01/11/23  9:25 PM  Result Value Ref Range   Yeast Wet Prep HPF POC NONE SEEN NONE SEEN   Trich, Wet Prep NONE SEEN NONE SEEN   Clue Cells Wet Prep HPF POC NONE SEEN NONE SEEN   WBC, Wet Prep HPF POC >=10 (A) <10   Sperm NONE SEEN  Comment:  Performed at [facility], [address] [place], [place] [address]   US RENAL  Result Date: 01/12/2023 CLINICAL DATA:  Acute kidney injury EXAM: RENAL / URINARY TRACT ULTRASOUND COMPLETE COMPARISON:  12/14/2022 FINDINGS: Right Kidney: Renal measurements: 10.9 x 4.1 x 4.1 cm = volume: 95 mL. Echogenicity within normal limits. No mass or hydronephrosis visualized. Left Kidney: Renal measurements: 11.8 x 5.9 x 4.0 cm = volume: 148 mL. Echogenicity within normal limits. No mass or hydronephrosis visualized. Bladder: Appears normal for degree of bladder distention. Other: None. IMPRESSION: Normal renal ultrasound. Electronically Signed   By: Charline Bills M.D.   On: 01/12/2023 00:27   DG CHEST PORT 1 VIEW  Result Date: 01/11/2023 CLINICAL DATA:  Sepsis EXAM: PORTABLE CHEST 1 VIEW COMPARISON:  03/24/2020 FINDINGS: The heart size and mediastinal contours are within normal limits. Both lungs are clear. The visualized skeletal structures are unremarkable. IMPRESSION: No active disease. Electronically Signed   By: Charlett Nose M.D.   On: 01/11/2023 23:46   US PELVIC COMPLETE WITH TRANSVAGINAL  Result Date: 01/11/2023 CLINICAL DATA:  Vaginal discharge for 8 months EXAM: TRANSABDOMINAL AND TRANSVAGINAL ULTRASOUND OF PELVIS TECHNIQUE: Both transabdominal and transvaginal ultrasound examinations of the pelvis were performed. Transabdominal technique was performed for global imaging of the pelvis including uterus, ovaries, adnexal regions, and pelvic cul-de-sac. It was necessary to proceed with endovaginal exam following the transabdominal exam to visualize the endometrium and adnexal structures. COMPARISON:  12/14/2022 FINDINGS: Uterus Measurements: 15.3 x 5.4 x 7.1 cm = volume: 380.4 mL. Uterus is enlarged with diffuse heterogeneity suggesting underlying fibroids. Enlarged mass is again identified within the lower uterine segment extending toward the cervix, measuring 7.8 x 6.0 by 7.2 cm. There is  significant internal vascularity with central cystic areas possibly representing necrosis or venous lakes. Doppler images suggest possible vascular stalk common this could reflect a prolapsed submucosal fibroid or endometrial polyp. Endometrium Not visualized. Right ovary Not visualized. Left ovary Not visualized. Other findings No free fluid. IMPRESSION: 1. 7.8 cm hypervascular mass within the lower uterine segment, extending toward the cervix, with evidence of a vascular stalk on Doppler imaging. Differential would include endometrial mass such as polyp versus pedunculated submucosal fibroid. Dedicated nonemergent pelvic MRI with and without contrast is recommended. 2. Nonvisualization of the ovaries and endometrium. Electronically Signed   By: Sharlet Salina M.D.   On: 01/11/2023 17:12    Pending Labs Unresulted Labs (From admission, onward)     Start     Ordered   01/12/23 0500  Basic metabolic panel  Tomorrow morning,   R        01/11/23 2335   01/12/23 0500  CBC  Tomorrow morning,   R        01/11/23 2335   01/12/23 0500  Magnesium  Tomorrow morning,   R        01/11/23 2335   01/12/23 0500  Phosphorus  Tomorrow morning,   R        01/11/23 2335   01/12/23 0500  Hemoglobin A1c  Tomorrow morning,   R       Comments: To assess prior glycemic control    01/12/23 0116   01/11/23 2151  Blood culture (routine x 2)  BLOOD CULTURE X 2,   R (with STAT occurrences)      01/11/23 2151   01/11/23 2116  Urine Culture  Add-on,   AD       Question:  Indication  Answer:  Dysuria   01/11/23 2115   01/11/23 1410  RPR  Once,   URGENT        01/11/23 1410            Vitals/Pain Today's Vitals   01/12/23 0027 01/12/23 0030 01/12/23 0045 01/12/23 0100  BP: (!) 91/59 (!) 90/56 (!) 101/57 (!) 93/55  Pulse: 71 72 73 71  Resp: 15 17 17 14   Temp: 98.4 F (36.9 C)     TempSrc: Oral     SpO2: 99% 99% 100% 99%  Weight:      Height:      PainSc: 0-No pain       Isolation Precautions No active  isolations  Medications Medications  doxycycline (VIBRAMYCIN) 100 mg in dextrose 5 % 250 mL IVPB (100 mg Intravenous New Bag/Given 01/12/23 0108)  sodium chloride flush (NS) 0.9 % injection 3 mL (3 mLs Intravenous Given 01/11/23 2346)  acetaminophen (TYLENOL) tablet 1,000 mg (has no administration in time range)  doxycycline (VIBRAMYCIN) 100 mg in dextrose 5 % 250 mL IVPB (has no administration in time range)  cefTRIAXone (ROCEPHIN) 1 g in sodium chloride 0.9 % 100 mL IVPB (has no administration in time range)  metroNIDAZOLE (FLAGYL) IVPB 500 mg (has no administration in time range)  0.9 %  sodium chloride infusion (has no administration in time range)  simvastatin (ZOCOR) tablet 40 mg (has no administration in time range)  gabapentin (NEURONTIN) capsule 100 mg (has no administration in time range)  insulin aspart (novoLOG) injection 0-6 Units (has no administration in time range)  acetaminophen (TYLENOL) tablet 1,000 mg (1,000 mg Oral Given 01/11/23 1829)  cefTRIAXone (ROCEPHIN) 1 g in sodium chloride 0.9 % 100 mL IVPB (0 g Intravenous Stopped 01/11/23 2337)  sodium chloride 0.9 % bolus 1,000 mL (0 mLs Intravenous Stopped 01/12/23 0054)  metroNIDAZOLE (FLAGYL) IVPB 500 mg (0 mg Intravenous Stopped 01/12/23 0054)  sodium chloride 0.9 % bolus 1,000 mL (1,000 mLs Intravenous New Bag/Given 01/12/23 0104)    Mobility walks     Focused Assessments    R Recommendations: See Admitting Provider Note  Report given to:   Additional Notes:  na

## 2023-01-12 NOTE — Progress Notes (Signed)
Pharmacy Antibiotic Note  Danielle Mullins is a 62 y.o. female admitted on 01/11/2023 with sepsis.  Pharmacy has been consulted for Vancomycin dosing. WBC WNL. Noted renal dysfunction. Tmax 101.1.   Plan: Vancomycin 1250 mg IV q48h >>>Estimated AUC: 459 Cefepime per MD Trend WBC, temp, renal function  F/U infectious work-up Drug levels as indicated   Height: 5\' 6"  (167.6 cm) Weight: 92.1 kg (203 lb) IBW/kg (Calculated) : 59.3  Temp (24hrs), Avg:99.1 F (37.3 C), Min:97.9 F (36.6 C), Max:101.1 F (38.4 C)  Recent Labs  Lab 01/11/23 1427 01/11/23 1446 01/12/23 0428  WBC 9.8  --   --   CREATININE 1.76*  --  1.80*  LATICACIDVEN  --  1.5  --     Estimated Creatinine Clearance: 37 mL/min (A) (by C-G formula based on SCr of 1.8 mg/dL (H)).    Allergies  Allergen Reactions   Celecoxib Swelling   Abran Duke, PharmD, BCPS Clinical Pharmacist Phone: [phone]

## 2023-01-12 NOTE — Consult Note (Signed)
Gynecology Progress Note  Admission Date: 01/11/2023 Current Date: 01/12/2023 11:19 AM  Danielle Mullins is a 62 y.o. postmenopausal female. HD#1 admitted for lower abdominal pain, vaginal discharge and possible uterine mass.   History complicated by: Patient Active Problem List   Diagnosis Date Noted   Uterine mass 01/11/2023   Fever 01/11/2023   AKI (acute kidney injury) (HCC) 01/11/2023   Sepsis (HCC) 01/11/2023   Diabetes mellitus without complication (HCC)    Hypertension     ROS and patient/family/surgical history, located on admission H&P note dated 01/11/2023, have been reviewed, and there are no changes except as noted below Yesterday/Overnight Events:  None significant  Subjective:  Pt seen this AM/  She currently denies pain or bleeding currently.  No fever, but occasional chills.  Objective:   Vitals:   01/12/23 0218 01/12/23 0431 01/12/23 0606 01/12/23 0746  BP: (!) 96/57 (!) 85/55 (!) 92/55 (!) 104/55  Pulse: 62 66 71 78  Resp: 18 20 20 20   Temp: 98.3 F (36.8 C) 98.6 F (37 C) 98.8 F (37.1 C)   TempSrc: Oral     SpO2: 100% 99% 100% 99%  Weight:      Height:        Temp:  [97.9 F (36.6 C)-101.1 F (38.4 C)] 98.8 F (37.1 C) (12/11 0606) Pulse Rate:  [62-115] 78 (12/11 0746) Resp:  [14-20] 20 (12/11 0746) BP: (85-127)/(55-75) 104/55 (12/11 0746) SpO2:  [98 %-100 %] 99 % (12/11 0746) Weight:  [92.1 kg] 92.1 kg (12/10 1348) I/O last 3 completed shifts: In: 575 [I.V.:325; IV Piggyback:250] Out: -  Total I/O In: 120 [P.O.:120] Out: -   Intake/Output Summary (Last 24 hours) at 01/12/2023 1119 Last data filed at 01/12/2023 0926 Gross per 24 hour  Intake 695 ml  Output --  Net 695 ml     Current Vital Signs 24h Vital Sign Ranges  T 98.8 F (37.1 C) Temp  Avg: 99.1 F (37.3 C)  Min: 97.9 F (36.6 C)  Max: 101.1 F (38.4 C)  BP (!) 104/55 BP  Min: 85/55  Max: 127/75  HR 78 Pulse  Avg: 77.6  Min: 62  Max: 115  RR 20 Resp  Avg: 18.1  Min:  14  Max: 20  SaO2 99 % Room Air SpO2  Avg: 99.4 %  Min: 98 %  Max: 100 %       24 Hour I/O Current Shift I/O  Time Ins Outs 12/10 0701 - 12/11 0700 In: 575 [I.V.:325] Out: -  12/11 0701 - 12/11 1900 In: 120 [P.O.:120] Out: -    Patient Vitals for the past 12 hrs:  BP Temp Temp src Pulse Resp SpO2  01/12/23 0746 (!) 104/55 -- -- 78 20 99 %  01/12/23 0606 (!) 92/55 98.8 F (37.1 C) -- 71 20 100 %  01/12/23 0431 (!) 85/55 98.6 F (37 C) -- 66 20 99 %  01/12/23 0218 (!) 96/57 98.3 F (36.8 C) Oral 62 18 100 %  01/12/23 0100 (!) 93/55 -- -- 71 14 99 %  01/12/23 0045 (!) 101/57 -- -- 73 17 100 %  01/12/23 0030 (!) 90/56 -- -- 72 17 99 %  01/12/23 0027 (!) 91/59 98.4 F (36.9 C) Oral 71 15 99 %  01/12/23 0000 101/65 -- -- 67 18 99 %  01/11/23 2345 107/63 -- -- 68 -- 100 %     Patient Vitals for the past 24 hrs:  BP Temp Temp src Pulse Resp  SpO2 Height Weight  01/12/23 0746 (!) 104/55 -- -- 78 20 99 % -- --  01/12/23 0606 (!) 92/55 98.8 F (37.1 C) -- 71 20 100 % -- --  01/12/23 0431 (!) 85/55 98.6 F (37 C) -- 66 20 99 % -- --  01/12/23 0218 (!) 96/57 98.3 F (36.8 C) Oral 62 18 100 % -- --  01/12/23 0100 (!) 93/55 -- -- 71 14 99 % -- --  01/12/23 0045 (!) 101/57 -- -- 73 17 100 % -- --  01/12/23 0030 (!) 90/56 -- -- 72 17 99 % -- --  01/12/23 0027 (!) 91/59 98.4 F (36.9 C) Oral 71 15 99 % -- --  01/12/23 0000 101/65 -- -- 67 18 99 % -- --  01/11/23 2345 107/63 -- -- 68 -- 100 % -- --  01/11/23 2316 -- 98.7 F (37.1 C) Oral -- -- -- -- --  01/11/23 2255 111/63 -- -- 70 18 100 % -- --  01/11/23 1824 115/71 (!) 101.1 F (38.4 C) Oral (!) 102 19 100 % -- --  01/11/23 1823 115/71 (!) 101.1 F (38.4 C) -- 100 19 100 % -- --  01/11/23 1348 -- -- -- -- -- -- 5\' 6"  (1.676 m) 92.1 kg  01/11/23 1346 127/75 97.9 F (36.6 C) -- (!) 115 20 98 % -- --    Physical exam: General appearance: alert, cooperative, appears stated age, and no distress Abdomen: soft, non-tender; bowel  sounds normal; no masses,  no organomegaly GU: No gross VB Lungs: clear to auscultation bilaterally Heart: regular rate and rhythm Extremities: no lower extremity edema Skin: WNL Psych: appropriate Neurologic: Grossly normal  Medications Current Facility-Administered Medications  Medication Dose Route Frequency Provider Last Rate Last Admin   0.9 %  sodium chloride infusion   Intravenous Continuous Dolly Rias, MD 75 mL/hr at 01/12/23 0110 Infusion Verify at 01/12/23 0110   acetaminophen (TYLENOL) tablet 1,000 mg  1,000 mg Oral Q6H PRN Dolly Rias, MD   1,000 mg at 01/12/23 1117   ceFEPIme (MAXIPIME) 2 g in sodium chloride 0.9 % 100 mL IVPB  2 g Intravenous Q12H Dolly Rias, MD 200 mL/hr at 01/12/23 0956 2 g at 01/12/23 0956   doxycycline (VIBRAMYCIN) 100 mg in dextrose 5 % 250 mL IVPB  100 mg Intravenous Q12H Dolly Rias, MD       gabapentin (NEURONTIN) capsule 100 mg  100 mg Oral QHS PRN Dolly Rias, MD       insulin aspart (novoLOG) injection 0-6 Units  0-6 Units Subcutaneous TID WC Segars, Christiane Ha, MD       metroNIDAZOLE (FLAGYL) IVPB 500 mg  500 mg Intravenous Q12H Dolly Rias, MD 100 mL/hr at 01/12/23 0957 500 mg at 01/12/23 0957   simvastatin (ZOCOR) tablet 40 mg  40 mg Oral QHS Segars, Christiane Ha, MD       sodium chloride flush (NS) 0.9 % injection 3 mL  3 mL Intravenous Q12H Segars, Christiane Ha, MD   3 mL at 01/11/23 2346   vancomycin (VANCOREADY) IVPB 1250 mg/250 mL  1,250 mg Intravenous Q48H Stevphen Rochester, RPH 166.7 mL/hr at 01/12/23 0744 1,250 mg at 01/12/23 0744      Labs  Recent Labs  Lab 01/11/23 1427 01/12/23 0750  WBC 9.8 9.3  HGB 10.4* 7.8*  HCT 35.2* 24.2*  PLT 530* 474*    Recent Labs  Lab 01/11/23 1427 01/12/23 0428  NA 133* 134*  K 4.1 3.5  CL 99 105  CO2  19* 24  BUN 24* 26*  CREATININE 1.76* 1.80*  CALCIUM 9.2 8.2*  PROT 8.1  --   BILITOT 0.9  --   ALKPHOS 63  --   ALT 15  --   AST 24  --   GLUCOSE 154* 131*     Radiology CLINICAL DATA:  Vaginal discharge for 8 months   EXAM: TRANSABDOMINAL AND TRANSVAGINAL ULTRASOUND OF PELVIS   TECHNIQUE: Both transabdominal and transvaginal ultrasound examinations of the pelvis were performed. Transabdominal technique was performed for global imaging of the pelvis including uterus, ovaries, adnexal regions, and pelvic cul-de-sac. It was necessary to proceed with endovaginal exam following the transabdominal exam to visualize the endometrium and adnexal structures.   COMPARISON:  12/14/2022   FINDINGS: Uterus   Measurements: 15.3 x 5.4 x 7.1 cm = volume: 380.4 mL. Uterus is enlarged with diffuse heterogeneity suggesting underlying fibroids. Enlarged mass is again identified within the lower uterine segment extending toward the cervix, measuring 7.8 x 6.0 by 7.2 cm. There is significant internal vascularity with central cystic areas possibly representing necrosis or venous lakes. Doppler images suggest possible vascular stalk common this could reflect a prolapsed submucosal fibroid or endometrial polyp.   Endometrium   Not visualized.   Right ovary   Not visualized.   Left ovary   Not visualized.   Other findings   No free fluid.   IMPRESSION: 1. 7.8 cm hypervascular mass within the lower uterine segment, extending toward the cervix, with evidence of a vascular stalk on Doppler imaging. Differential would include endometrial mass such as polyp versus pedunculated submucosal fibroid. Dedicated nonemergent pelvic MRI with and without contrast is recommended. 2. Nonvisualization of the ovaries and endometrium.    Assessment & Plan:  Uterine mass *GYN: ultrasound results reviewed.   When kidney function improves consider MRI * mass biopsy results still pending, results will give more clarity on treatment options  Code Status: Full Code  Total time taking care of the patient was 10 minutes, with greater than 50% of the time  spent in face to face interaction with the patient.  Mariel Aloe, MD Attending Center for [facility])

## 2023-01-12 NOTE — H&P (Addendum)
History and Physical    Danielle Mullins AVW:[id number] DOB: 08-16-1960 DOA: 01/11/2023  PCP: Mirna Mires, MD   Patient coming from: Home   Chief Complaint:  Chief Complaint  Patient presents with   Vaginal Discharge   Abdominal Pain   Spasms   Chills    HPI:  Danielle Mullins is a 62 y.o. female with hx of hypertension, hyperlipidemia, OSA not on CPAP, obesity, Mobitz 2 AVB previously declined PPM, spondylosis, who presented due to persistent vaginal discharge.  No known history of GYN malignancy /surgery.  Remote history of Paps.  Reports that she has had vaginal discharge ongoing since October but significantly worsened since Thanksgiving.  Discharge is typically brown/scant blood.  However in recent weeks has had purulent appearing yellow/orange discharge.  Has had associated chills but did not measure her temperature at home.  No other illness, cough/cold, chest pain, dyspnea, abdominal pain, nausea, vomiting, diarrhea, dysuria, rashes.   Review of Systems:  ROS complete and negative except as marked above   Allergies  Allergen Reactions   Celecoxib Swelling    Prior to Admission medications   Medication Sig Start Date End Date Taking? Authorizing Provider  gabapentin (NEURONTIN) 100 MG capsule Take 100 mg by mouth at bedtime as needed.   Yes [provider]  Iron-FA-B Cmp-C-Biot-Probiotic (FUSION PLUS) CAPS Take 1 capsule by mouth daily.   Yes [provider]  lisinopril-hydrochlorothiazide (ZESTORETIC) 10-12.5 MG tablet Take 1 tablet by mouth daily. 03/13/20  Yes [provider]  metFORMIN (GLUCOPHAGE) 500 MG tablet Take 500 mg by mouth 2 (two) times daily with a meal.   Yes [provider]  RYBELSUS 7 MG TABS Take 1 tablet by mouth daily. 12/24/22  Yes [provider]  simvastatin (ZOCOR) 40 MG tablet Take 40 mg by mouth at bedtime. 03/14/20  Yes [provider]    Past Medical History:  Diagnosis Date   Diabetes  mellitus without complication (HCC)    Fibroids    Heart disease, unspecified e   enlarged heart chamber- patient has referral   Hypertension    Ingrown toenail 01/13/2015   Mobitz type 2 second degree AV block 03/24/2020   Plantar fasciitis 01/13/2015   Porokeratosis 01/13/2015   Sesamoiditis 01/13/2015   Symptomatic bradycardia 03/24/2020    No past surgical history on file.   reports that she has never smoked. She has never used smokeless tobacco. She reports that she does not drink alcohol and does not use drugs.  Family History  Problem Relation Age of Onset   Diabetes Mother    Hypertension Mother    Heart disease Father    Hypertension Father    Diabetes Brother      Physical Exam: Vitals:   01/12/23 0045 01/12/23 0100 01/12/23 0218 01/12/23 0431  BP: (!) 101/57 (!) 93/55 (!) 96/57 (!) 85/55  Pulse: 73 71 62 66  Resp: 17 14 18 20   Temp:   98.3 F (36.8 C) 98.6 F (37 C)  TempSrc:   Oral   SpO2: 100% 99% 100% 99%  Weight:      Height:        Gen: Awake, alert, NAD   CV: Regular, normal S1, S2, no murmurs  Resp: Normal WOB, CTAB  Abd: Flat, normoactive, nontender, slightly firm in suprapubic area without a well-defined mass MSK: Symmetric, no edema  Skin: No rashes or lesions to exposed skin  Neuro: Alert and interactive  Psych: euthymic, appropriate    Data  review:   Labs reviewed, notable for:   Lactate 1.5 NA 133 Bicarb 19, anion gap 15 Creatinine 1.7, baseline possibly 0.9 WBC 9, hemoglobin 10, normocytic Wet prep no yeast, trichomonas, clue cells, positive WBC UA with rare bacteria, RBCs, pyuria, leukocytes, negative nitrites, positive ketone and protein.  Micro:  Results for orders placed or performed during the hospital encounter of 01/11/23  Wet prep, genital     Status: Abnormal   Collection Time: 01/11/23  9:25 PM  Result Value Ref Range Status   Yeast Wet Prep HPF POC NONE SEEN NONE SEEN Final   Trich, Wet Prep NONE SEEN NONE SEEN  Final   Clue Cells Wet Prep HPF POC NONE SEEN NONE SEEN Final   WBC, Wet Prep HPF POC >=10 (A) <10 Final   Sperm NONE SEEN  Final    Comment: Performed at [facility], [address], [place], [place] [address]    Imaging reviewed:  US RENAL  Result Date: 01/12/2023 CLINICAL DATA:  Acute kidney injury EXAM: RENAL / URINARY TRACT ULTRASOUND COMPLETE COMPARISON:  12/14/2022 FINDINGS: Right Kidney: Renal measurements: 10.9 x 4.1 x 4.1 cm = volume: 95 mL. Echogenicity within normal limits. No mass or hydronephrosis visualized. Left Kidney: Renal measurements: 11.8 x 5.9 x 4.0 cm = volume: 148 mL. Echogenicity within normal limits. No mass or hydronephrosis visualized. Bladder: Appears normal for degree of bladder distention. Other: None. IMPRESSION: Normal renal ultrasound. Electronically Signed   By: Charline Bills M.D.   On: 01/12/2023 00:27   DG CHEST PORT 1 VIEW  Result Date: 01/11/2023 CLINICAL DATA:  Sepsis EXAM: PORTABLE CHEST 1 VIEW COMPARISON:  03/24/2020 FINDINGS: The heart size and mediastinal contours are within normal limits. Both lungs are clear. The visualized skeletal structures are unremarkable. IMPRESSION: No active disease. Electronically Signed   By: Charlett Nose M.D.   On: 01/11/2023 23:46   US PELVIC COMPLETE WITH TRANSVAGINAL  Result Date: 01/11/2023 CLINICAL DATA:  Vaginal discharge for 8 months EXAM: TRANSABDOMINAL AND TRANSVAGINAL ULTRASOUND OF PELVIS TECHNIQUE: Both transabdominal and transvaginal ultrasound examinations of the pelvis were performed. Transabdominal technique was performed for global imaging of the pelvis including uterus, ovaries, adnexal regions, and pelvic cul-de-sac. It was necessary to proceed with endovaginal exam following the transabdominal exam to visualize the endometrium and adnexal structures. COMPARISON:  12/14/2022 FINDINGS: Uterus Measurements: 15.3 x 5.4 x 7.1 cm = volume: 380.4 mL. Uterus is enlarged with diffuse heterogeneity  suggesting underlying fibroids. Enlarged mass is again identified within the lower uterine segment extending toward the cervix, measuring 7.8 x 6.0 by 7.2 cm. There is significant internal vascularity with central cystic areas possibly representing necrosis or venous lakes. Doppler images suggest possible vascular stalk common this could reflect a prolapsed submucosal fibroid or endometrial polyp. Endometrium Not visualized. Right ovary Not visualized. Left ovary Not visualized. Other findings No free fluid. IMPRESSION: 1. 7.8 cm hypervascular mass within the lower uterine segment, extending toward the cervix, with evidence of a vascular stalk on Doppler imaging. Differential would include endometrial mass such as polyp versus pedunculated submucosal fibroid. Dedicated nonemergent pelvic MRI with and without contrast is recommended. 2. Nonvisualization of the ovaries and endometrium. Electronically Signed   By: Sharlet Salina M.D.   On: 01/11/2023 17:12      ED Course:  EDP performed pelvic exam with appearance of a mass in the upper vaginal vault.  GYN was consulted and on their exam had friable necrotic appearing mass at the upper  vaginal vault which was biopsied, recommending treatment for PID.    Assessment/Plan:  61 y.o. female with hx hypertension, hyperlipidemia, OSA not on CPAP, obesity, Mobitz 2 AVB previously declined PPM, spondylosis, who presented due to persistent vaginal discharge.  Found to be septic with source suspected infected uterine mass  Suspect infected uterine mass Sepsis secondary to above, present on admission, with progression to septic shock History chronic progressive vaginal discharge, recently purulent discharge, also likely chronic slow bleeding.  On initial ED evaluation febrile to Tmax 38.4, tachycardic.  WBC 9, initial lactate 1.5.  UA questionable for infection although patient denies urinary symptoms.  EDP performed pelvic exam with appearance of a mass in the upper  vaginal vault.  GYN was consulted and on their exam had friable necrotic appearing mass at the upper vaginal vault which was biopsied, recommending treatment for PID.  Transvaginal ultrasound was obtained demonstrating enlarging mass, 7.8 cm maximum dimension, with vascular stock, central cystic area possibly representing necrosis or venous lakes.  Concerned that rapid enlarging and centrally necrotic mass may represent underlying malignancy although etiology unclear at this point.  After admission to the floor, worsening blood pressure trend now borderline hypotensive systolics into the 80s with MAP 65.  - GYN consulted and will follow.  Uterine mass biopsied, pathology to be followed - Initial coverage for PID with ceftriaxone, doxycycline, Flagyl, due to worsening hypotension broaden to vancomycin, cefepime and will continue Doxy/Flagyl. - Status post 2 L IV fluid, give additional 500 cc.  If worsening hypotension may need escalation for pressors - Check repeat lactic acid given worsening BP trend - Per lab hemoglobin drop 10 -> 7, no brisk vaginal bleeding noted per RN.  Possible that she was actually hemoconcentrated initially and now revealing underlying anemia with IV fluid resuscitation.  Check type and screen, transfuse if hemoglobin less than 7  AKI stage I Baseline creatinine unknown, remote date 0.9; elevated to 1.7 on admission.  Suspect prerenal with sepsis. -IV fluids per above - Check renal ultrasound rule out obstructive process  Anemia normocytic Possible slow bleeding from uterine mass. - Check iron panel, B12  Chronic medical problems: History hypertension: Hold home lisinopril/HCTZ in the setting of worsening sepsis Diabetes: Hold her home metformin, Rybelsus.  SSI while inpatient.  Continue home gabapentin HLD: Continue home simvastatin  Body mass index is 32.77 kg/m.  Obesity class I affecting medical care per above  DVT prophylaxis:  SCDs Code Status:  Full  Code Diet:  Diet Orders (From admission, onward)     Start     Ordered   01/11/23 2335  Diet regular Room service appropriate? Yes; Fluid consistency: Thin  Diet effective now       Question Answer Comment  Room service appropriate? Yes   Fluid consistency: Thin      01/11/23 2335           Family Communication: No Consults: Gynecology Admission status:   Inpatient, Telemetry bed -> progressive   Severity of Illness: The appropriate patient status for this patient is INPATIENT. Inpatient status is judged to be reasonable and necessary in order to provide the required intensity of service to ensure the patient's safety. The patient's presenting symptoms, physical exam findings, and initial radiographic and laboratory data in the context of their chronic comorbidities is felt to place them at high risk for further clinical deterioration. Furthermore, it is not anticipated that the patient will be medically stable for discharge from the hospital within 2 midnights of  admission.   * I certify that at the point of admission it is my clinical judgment that the patient will require inpatient hospital care spanning beyond 2 midnights from the point of admission due to high intensity of service, high risk for further deterioration and high frequency of surveillance required.*   Dolly Rias, MD [facility]  How to contact the [facility] Attending or Consulting provider 7A - 7P or covering provider during after hours 7P -7A, for this patient.  Check the care team in [facility] and look for a) attending/consulting [facility] provider listed and b) the [facility] team listed Log into [email or web address] and use [facility]'s universal password to access. If you do not have the password, please contact the hospital operator. Locate the [facility] provider you are looking for under [facility] and page to a number that you can be directly reached. If you still have difficulty reaching the provider, please page the [facility]  (Director on Call) for the Hospitalists listed on amion for assistance.  01/12/2023, 5:56 AM

## 2023-01-12 NOTE — ED Notes (Signed)
Provider at bedside

## 2023-01-13 ENCOUNTER — Inpatient Hospital Stay (HOSPITAL_COMMUNITY): Payer: BC Managed Care – PPO

## 2023-01-13 DIAGNOSIS — N858 Other specified noninflammatory disorders of uterus: Secondary | ICD-10-CM | POA: Diagnosis not present

## 2023-01-13 LAB — BASIC METABOLIC PANEL
Anion gap: 8 (ref 5–15)
BUN: 11 mg/dL (ref 8–23)
CO2: 22 mmol/L (ref 22–32)
Calcium: 8.2 mg/dL — ABNORMAL LOW (ref 8.9–10.3)
Chloride: 106 mmol/L (ref 98–111)
Creatinine, Ser: 1.04 mg/dL — ABNORMAL HIGH (ref 0.44–1.00)
GFR, Estimated: >60 mL/min (ref >60)
Glucose, Bld: 127 mg/dL — ABNORMAL HIGH (ref 70–99)
Potassium: 3.6 mmol/L (ref 3.5–5.1)
Sodium: 136 mmol/L (ref 135–145)

## 2023-01-13 LAB — CBC
HCT: 24.1 % — ABNORMAL LOW (ref 36.0–46.0)
Hemoglobin: 7.7 g/dL — ABNORMAL LOW (ref 12.0–15.0)
MCH: 24.1 pg — ABNORMAL LOW (ref 26.0–34.0)
MCHC: 32 g/dL (ref 30.0–36.0)
MCV: 75.5 fL — ABNORMAL LOW (ref 80.0–100.0)
Platelets: 498 10*3/uL — ABNORMAL HIGH (ref 150–400)
RBC: 3.19 MIL/uL — ABNORMAL LOW (ref 3.87–5.11)
RDW: 12.8 % (ref 11.5–15.5)
WBC: 9.5 10*3/uL (ref 4.0–10.5)
nRBC: 0 % (ref 0.0–0.2)

## 2023-01-13 LAB — GLUCOSE, CAPILLARY
Glucose-Capillary: 102 mg/dL — ABNORMAL HIGH (ref 70–99)
Glucose-Capillary: 117 mg/dL — ABNORMAL HIGH (ref 70–99)
Glucose-Capillary: 110 mg/dL — ABNORMAL HIGH (ref 70–99)

## 2023-01-13 MED ORDER — METRONIDAZOLE 500 MG PO TABS
500.0000 mg | ORAL_TABLET | Freq: Two times a day (BID) | ORAL | Status: DC
  Administered 2023-01-13 – 2023-01-14 (×2): 500 mg via ORAL
  Filled 2023-01-13 (×2): qty 1

## 2023-01-13 MED ORDER — DOXYCYCLINE HYCLATE 100 MG PO TABS
100.0000 mg | ORAL_TABLET | Freq: Two times a day (BID) | ORAL | Status: DC
  Administered 2023-01-13 – 2023-01-14 (×2): 100 mg via ORAL
  Filled 2023-01-13 (×2): qty 1

## 2023-01-13 MED ORDER — GADOBUTROL 1 MMOL/ML IV SOLN
9.0000 mL | Freq: Once | INTRAVENOUS | Status: AC | PRN
  Administered 2023-01-13: 9 mL via INTRAVENOUS

## 2023-01-13 NOTE — Plan of Care (Signed)
  Problem: Education: Goal: Ability to describe self-care measures that may prevent or decrease complications (Diabetes Survival Skills Education) will improve Outcome: Progressing Goal: Individualized Educational Video(s) Outcome: Progressing   Problem: Coping: Goal: Ability to adjust to condition or change in health will improve Outcome: Progressing   Problem: Health Behavior/Discharge Planning: Goal: Ability to identify and utilize available resources and services will improve Outcome: Progressing   Problem: Metabolic: Goal: Ability to maintain appropriate glucose levels will improve Outcome: Progressing

## 2023-01-13 NOTE — TOC Initial Note (Signed)
Transition of Care [facility]) - Initial/Assessment Note    Patient Details  Name: Danielle Mullins MRN: [id number] Date of Birth: 1960/03/25  [facility]) CM/SW Contact:    Leone Haven, RN Phone Number: 01/13/2023, 4:12 PM  Clinical Narrative:                 From home alone, has PCP and insurance on file, states has no HH services in place at this time or DME at home.  States brother will transport her home at dc and he is support system, states gets medications from [facility] on [address]  Pta self ambulatory .  Expected Discharge Plan: Home/Self Care Barriers to Discharge: Continued Medical Work up   Patient Goals and CMS Choice Patient states their goals for this hospitalization and ongoing recovery are:: return home   Choice offered to / list presented to : NA      Expected Discharge Plan and Services In-house Referral: NA Discharge Planning Services: CM Consult Post Acute Care Choice: NA Living arrangements for the past 2 months: Single Family Home                 DME Arranged: N/A DME Agency: NA       HH Arranged: NA          Prior Living Arrangements/Services Living arrangements for the past 2 months: Single Family Home Lives with:: Self Patient language and need for interpreter reviewed:: Yes Do you feel safe going back to the place where you live?: Yes      Need for Family Participation in Patient Care: Yes (Comment) Care giver support system in place?: Yes (comment)   Criminal Activity/Legal Involvement Pertinent to Current Situation/Hospitalization: No - Comment as needed  Activities of Daily Living   ADL Screening (condition at time of admission) Independently performs ADLs?: Yes (appropriate for developmental age) Is the patient deaf or have difficulty hearing?: No Does the patient have difficulty seeing, even when wearing glasses/contacts?: No Does the patient have difficulty concentrating, remembering, or making decisions?:  No  Permission Sought/Granted Permission sought to share information with : Case Manager Permission granted to share information with : Yes, Verbal Permission Granted              Emotional Assessment Appearance:: Appears stated age Attitude/Demeanor/Rapport: Engaged Affect (typically observed): Appropriate Orientation: : Oriented to Self, Oriented to Place, Oriented to  Time, Oriented to Situation Alcohol / Substance Use: Not Applicable Psych Involvement: No (comment)  Admission diagnosis:  AKI (acute kidney injury) (HCC) [N17.9] Sepsis (HCC) [A41.9] Patient Active Problem List   Diagnosis Date Noted   Uterine mass 01/11/2023   Fever 01/11/2023   AKI (acute kidney injury) (HCC) 01/11/2023   Sepsis (HCC) 01/11/2023   Diabetes mellitus without complication (HCC)    Hypertension    PCP:  Mirna Mires, MD Pharmacy:   [id number] [place], [place] - [address] [place] [address] Phone: [phone] Fax: [phone]     Social Drivers of Health (SDOH) Social History: SDOH Screenings   Food Insecurity: No Food Insecurity (01/12/2023)  Housing: Low Risk  (01/12/2023)  Transportation Needs: No Transportation Needs (01/12/2023)  Utilities: Not At Risk (01/12/2023)  Tobacco Use: Low Risk  (05/21/2022)   SDOH Interventions:     Readmission Risk Interventions     No data to display

## 2023-01-13 NOTE — Progress Notes (Signed)
PROGRESS NOTE  Danielle Mullins  ZOX:[id number] DOB: 09-16-1960 DOA: 01/11/2023 PCP: Mirna Mires, MD   Brief Narrative: Patient is a 62 year old female with history of hypertension, hyperlipidemia, OSA not on CPAP, spondylosis who presented with persistent vaginal discharge which has recently turned into yellow/orange.  Report of chills at home.  On presentation, she was febrile, hypotensive, tachycardic.  Work showed AKI with creatinine of 1.7.  Ultrasound of the pelvis showed 7.8 cm hypervascular mass within the lower uterine segment, extending toward the cervix. S/P biopsy. Started on broad-spectrum antibiotics.  GYN consulted.  Plan for MRI of pelvis  Assessment & Plan:  Principal Problem:   Uterine mass Active Problems:   Diabetes mellitus without complication (HCC)   Hypertension   Fever   AKI (acute kidney injury) (HCC)   Sepsis (HCC)   Suspected infected uterine mass: Presented with vaginal discharge, febrile, tachycardic.  Found to have friable necrotic appearing mass of the upper vaginal vault.  Biopsied.  Ultrasound of the pelvis as above.  Currently on broad spectrum antibiotics, being treated for PID.  GYN following and recommending MRI after improvement in the kidney function. Will follow-up pathology.  Follow cultures. MRI of the pelvis ordered.  AKI: Baseline creatinine normal.  Presented with creatinine in the range of 1.7.   Renal ultrasound did not show any obstructive pathology AKI resolved  Iron deficiency anemia: Most likely from chronic bleeding from the uterine mass.  Hemoglobin in the range of 7.  Iron studies showed low iron.  Given  IV iron.  Hypertension: Currently blood pressure stable.  Home medications lisinopril, hydrochlorothiazide on hold  Diabetes type 2: On metformin at home.  Currently on sliding scale  Hyperlipidemia: Continue on simvastatin  Obesity: BMI of 31.7        DVT prophylaxis:SCDs Start: 01/11/23 2334     Code Status: Full  Code  Family Communication: None at bedside  Patient status:Inpatient  Patient is from :Home  Anticipated discharge WJ:XBJY  Estimated DC date:after full work up   Consultants: GYN  Procedures:uterine biopsy  Antimicrobials:  Anti-infectives (From admission, onward)    Start     Dose/Rate Route Frequency Ordered Stop   01/12/23 2200  cefTRIAXone (ROCEPHIN) 1 g in sodium chloride 0.9 % 100 mL IVPB  Status:  Discontinued        1 g 200 mL/hr over 30 Minutes Intravenous Every 24 hours 01/11/23 2337 01/12/23 0553   01/12/23 2200  cefTRIAXone (ROCEPHIN) 2 g in sodium chloride 0.9 % 100 mL IVPB        2 g 200 mL/hr over 30 Minutes Intravenous Every 24 hours 01/12/23 1446     01/12/23 1000  doxycycline (VIBRAMYCIN) 100 mg in dextrose 5 % 250 mL IVPB        100 mg 125 mL/hr over 120 Minutes Intravenous Every 12 hours 01/11/23 2337     01/12/23 1000  metroNIDAZOLE (FLAGYL) IVPB 500 mg        500 mg 100 mL/hr over 60 Minutes Intravenous Every 12 hours 01/11/23 2337     01/12/23 1000  ceFEPIme (MAXIPIME) 2 g in sodium chloride 0.9 % 100 mL IVPB  Status:  Discontinued        2 g 200 mL/hr over 30 Minutes Intravenous Every 12 hours 01/12/23 0553 01/12/23 1446   01/12/23 0700  vancomycin (VANCOREADY) IVPB 1250 mg/250 mL  Status:  Discontinued        1,250 mg 166.7 mL/hr over 90 Minutes Intravenous Every  48 hours 01/12/23 0610 01/12/23 1446   01/11/23 2315  doxycycline (VIBRAMYCIN) 100 mg in dextrose 5 % 250 mL IVPB        100 mg 125 mL/hr over 120 Minutes Intravenous  Once 01/11/23 2309 01/12/23 0300   01/11/23 2315  metroNIDAZOLE (FLAGYL) IVPB 500 mg        500 mg 100 mL/hr over 60 Minutes Intravenous  Once 01/11/23 2309 01/12/23 0054   01/11/23 2200  cefTRIAXone (ROCEPHIN) 1 g in sodium chloride 0.9 % 100 mL IVPB        1 g 200 mL/hr over 30 Minutes Intravenous  Once 01/11/23 2151 01/11/23 2337       Subjective: Patient seen and examined at bedside today.  Hemodynamically  stable lying in bed.  Still has vaginal discharge but no bleeding.  No abdomen pain, nausea or vomiting today.  Objective: Vitals:   01/12/23 1924 01/13/23 0052 01/13/23 0440 01/13/23 0800  BP: 108/65 116/62 103/68 108/65  Pulse: 65 80 75 80  Resp: 18 18 18 18   Temp: 99.8 F (37.7 C) 98.8 F (37.1 C) 98.7 F (37.1 C) 97.7 F (36.5 C)  TempSrc: Oral Oral Oral Oral  SpO2: 99% 97% 99% 99%  Weight:   89.2 kg   Height:        Intake/Output Summary (Last 24 hours) at 01/13/2023 1110 Last data filed at 01/13/2023 8295 Gross per 24 hour  Intake 1665 ml  Output --  Net 1665 ml   Filed Weights   01/11/23 1348 01/13/23 0440  Weight: 92.1 kg 89.2 kg    Examination:  General exam: Overall comfortable, not in distress,obese HEENT: PERRL Respiratory system:  no wheezes or crackles  Cardiovascular system: S1 & S2 heard, RRR.  Gastrointestinal system: Abdomen is nondistended, soft and nontender. Central nervous system: Alert and oriented Extremities: No edema, no clubbing ,no cyanosis Skin: No rashes, no ulcers,no icterus     Data Reviewed: I have personally reviewed following labs and imaging studies  CBC: Recent Labs  Lab 01/11/23 1427 01/12/23 0750 01/13/23 0231  WBC 9.8 9.3 9.5  NEUTROABS 7.7  --   --   HGB 10.4* 7.8* 7.7*  HCT 35.2* 24.2* 24.1*  MCV 83.4 75.6* 75.5*  PLT 530* 474* 498*   Basic Metabolic Panel: Recent Labs  Lab 01/11/23 1427 01/12/23 0428 01/13/23 0231  NA 133* 134* 136  K 4.1 3.5 3.6  CL 99 105 106  CO2 19* 24 22  GLUCOSE 154* 131* 127*  BUN 24* 26* 11  CREATININE 1.76* 1.80* 1.04*  CALCIUM 9.2 8.2* 8.2*  MG  --  1.7  --   PHOS  --  3.4  --      Recent Results (from the past 240 hours)  Wet prep, genital     Status: Abnormal   Collection Time: 01/11/23  9:25 PM  Result Value Ref Range Status   Yeast Wet Prep HPF POC NONE SEEN NONE SEEN Final   Trich, Wet Prep NONE SEEN NONE SEEN Final   Clue Cells Wet Prep HPF POC NONE SEEN NONE  SEEN Final   WBC, Wet Prep HPF POC >=10 (A) <10 Final   Sperm NONE SEEN  Final    Comment: Performed at [facility], [address] [place], [place] [address]  Blood culture (routine x 2)     Status: None (Preliminary result)   Collection Time: 01/11/23 11:02 PM   Specimen: BLOOD  Result Value Ref Range Status  Specimen Description BLOOD RIGHT ANTECUBITAL  Final   Special Requests   Final    BOTTLES DRAWN AEROBIC AND ANAEROBIC Blood Culture adequate volume   Culture   Final    NO GROWTH 2 DAYS Performed at [facility], [address] [place], [place] [address]    Report Status PENDING  Incomplete  Blood culture (routine x 2)     Status: None (Preliminary result)   Collection Time: 01/12/23  1:28 AM   Specimen: BLOOD RIGHT ARM  Result Value Ref Range Status   Specimen Description BLOOD RIGHT ARM  Final   Special Requests   Final    BOTTLES DRAWN AEROBIC AND ANAEROBIC Blood Culture results may not be optimal due to an inadequate volume of blood received in culture bottles   Culture   Final    NO GROWTH 1 DAY Performed at [facility], [address] [place], [place] [address]    Report Status PENDING  Incomplete     Radiology Studies: US RENAL Result Date: 01/12/2023 CLINICAL DATA:  Acute kidney injury EXAM: RENAL / URINARY TRACT ULTRASOUND COMPLETE COMPARISON:  12/14/2022 FINDINGS: Right Kidney: Renal measurements: 10.9 x 4.1 x 4.1 cm = volume: 95 mL. Echogenicity within normal limits. No mass or hydronephrosis visualized. Left Kidney: Renal measurements: 11.8 x 5.9 x 4.0 cm = volume: 148 mL. Echogenicity within normal limits. No mass or hydronephrosis visualized. Bladder: Appears normal for degree of bladder distention. Other: None. IMPRESSION: Normal renal ultrasound. Electronically Signed   By: Charline Bills M.D.   On: 01/12/2023 00:27   DG CHEST PORT 1 VIEW Result Date: 01/11/2023 CLINICAL DATA:  Sepsis EXAM: PORTABLE CHEST 1 VIEW COMPARISON:   03/24/2020 FINDINGS: The heart size and mediastinal contours are within normal limits. Both lungs are clear. The visualized skeletal structures are unremarkable. IMPRESSION: No active disease. Electronically Signed   By: Charlett Nose M.D.   On: 01/11/2023 23:46   US PELVIC COMPLETE WITH TRANSVAGINAL Result Date: 01/11/2023 CLINICAL DATA:  Vaginal discharge for 8 months EXAM: TRANSABDOMINAL AND TRANSVAGINAL ULTRASOUND OF PELVIS TECHNIQUE: Both transabdominal and transvaginal ultrasound examinations of the pelvis were performed. Transabdominal technique was performed for global imaging of the pelvis including uterus, ovaries, adnexal regions, and pelvic cul-de-sac. It was necessary to proceed with endovaginal exam following the transabdominal exam to visualize the endometrium and adnexal structures. COMPARISON:  12/14/2022 FINDINGS: Uterus Measurements: 15.3 x 5.4 x 7.1 cm = volume: 380.4 mL. Uterus is enlarged with diffuse heterogeneity suggesting underlying fibroids. Enlarged mass is again identified within the lower uterine segment extending toward the cervix, measuring 7.8 x 6.0 by 7.2 cm. There is significant internal vascularity with central cystic areas possibly representing necrosis or venous lakes. Doppler images suggest possible vascular stalk common this could reflect a prolapsed submucosal fibroid or endometrial polyp. Endometrium Not visualized. Right ovary Not visualized. Left ovary Not visualized. Other findings No free fluid. IMPRESSION: 1. 7.8 cm hypervascular mass within the lower uterine segment, extending toward the cervix, with evidence of a vascular stalk on Doppler imaging. Differential would include endometrial mass such as polyp versus pedunculated submucosal fibroid. Dedicated nonemergent pelvic MRI with and without contrast is recommended. 2. Nonvisualization of the ovaries and endometrium. Electronically Signed   By: Sharlet Salina M.D.   On: 01/11/2023 17:12    Scheduled Meds:   insulin aspart  0-6 Units Subcutaneous TID WC   simvastatin  40 mg Oral QHS   sodium chloride flush  3 mL  Intravenous Q12H   Continuous Infusions:  cefTRIAXone (ROCEPHIN)  IV 2 g (01/12/23 2145)   doxycycline (VIBRAMYCIN) IV 100 mg (01/13/23 0830)   metronidazole 500 mg (01/13/23 0829)     LOS: 2 days   Burnadette Pop, MD [facility] P12/01/2023, 11:10 AM

## 2023-01-13 NOTE — Progress Notes (Addendum)
Gynecology Consult Progress Note  Admission Date: 01/11/2023 Current Date: 01/13/2023 1:00 PM  Danielle Mullins is a 62 y.o.  admitted for fevers, vaginal discharge with subsequent mass diagnosed.    History complicated by: Patient Active Problem List   Diagnosis Date Noted   Uterine mass 01/11/2023   Fever 01/11/2023   AKI (acute kidney injury) (HCC) 01/11/2023   Sepsis (HCC) 01/11/2023   Diabetes mellitus without complication (HCC)    Hypertension     ROS and patient/family/surgical history, located on admission H&P note dated 01/11/2023, have been reviewed, and there are no changes except as noted below Yesterday/Overnight Events:  none  Subjective:  Patient states her discharge and bleeding is better since the vaginal biopsy two days ago done in the ED. She used about two pads yesterday and continues to have watery discharge that is blood tinged.   Objective:    Current Vital Signs 24h Vital Sign Ranges  T 97.7 F (36.5 C) Temp  Avg: 98.7 F (37.1 C)  Min: 97.7 F (36.5 C)  Max: 99.8 F (37.7 C)  BP 111/66 BP  Min: 97/62  Max: 116/62  HR 80 Pulse  Avg: 75.3  Min: 65  Max: 80  RR 18 Resp  Avg: 18  Min: 18  Max: 18  SaO2 94 % Room Air SpO2  Avg: 98 %  Min: 94 %  Max: 100 %       24 Hour I/O Current Shift I/O  Time Ins Outs 12/11 0701 - 12/12 0700 In: 1725 [P.O.:240; I.V.:675] Out: -  12/12 0701 - 12/12 1900 In: 60 [P.O.:60] Out: -    Physical exam: General appearance: alert, cooperative, and appears stated age Abdomen:  soft, nttp GU: quilted pad examined and it has large area of watery dried discharge that has a few spots of pink tinge Lungs:  no respiratory distress Skin: warm and dry Psych: appropriate Neurologic: Grossly normal  Medications Current Facility-Administered Medications  Medication Dose Route Frequency Provider Last Rate Last Admin   acetaminophen (TYLENOL) tablet 1,000 mg  1,000 mg Oral Q6H PRN Dolly Rias, MD   1,000 mg at 01/12/23  1117   cefTRIAXone (ROCEPHIN) 2 g in sodium chloride 0.9 % 100 mL IVPB  2 g Intravenous Q24H Burnadette Pop, MD 200 mL/hr at 01/12/23 2145 2 g at 01/12/23 2145   doxycycline (VIBRAMYCIN) 100 mg in dextrose 5 % 250 mL IVPB  100 mg Intravenous Q12H Dolly Rias, MD 125 mL/hr at 01/13/23 0830 100 mg at 01/13/23 0830   gabapentin (NEURONTIN) capsule 100 mg  100 mg Oral QHS PRN Dolly Rias, MD   100 mg at 01/12/23 2142   insulin aspart (novoLOG) injection 0-6 Units  0-6 Units Subcutaneous TID WC Segars, Christiane Ha, MD       metroNIDAZOLE (FLAGYL) IVPB 500 mg  500 mg Intravenous Q12H Dolly Rias, MD 100 mL/hr at 01/13/23 0829 500 mg at 01/13/23 8413   oxyCODONE (Oxy IR/ROXICODONE) immediate release tablet 5 mg  5 mg Oral Q4H PRN Opyd, Lavone Neri, MD   5 mg at 01/12/23 2344   simvastatin (ZOCOR) tablet 40 mg  40 mg Oral QHS Dolly Rias, MD   40 mg at 01/12/23 2142   sodium chloride flush (NS) 0.9 % injection 3 mL  3 mL Intravenous Q12H Dolly Rias, MD   3 mL at 01/13/23 0830      Labs  BCx pending, NGTD Recent Labs  Lab 01/11/23 1427 01/12/23 0750 01/13/23 0231  WBC 9.8  9.3 9.5  HGB 10.4* 7.8* 7.7*  HCT 35.2* 24.2* 24.1*  PLT 530* 474* 498*    Recent Labs  Lab 01/11/23 1427 01/12/23 0428 01/13/23 0231  NA 133* 134* 136  K 4.1 3.5 3.6  CL 99 105 106  CO2 19* 24 22  BUN 24* 26* 11  CREATININE 1.76* 1.80* 1.04*  CALCIUM 9.2 8.2* 8.2*  PROT 8.1  --   --   BILITOT 0.9  --   --   ALKPHOS 63  --   --   ALT 15  --   --   AST 24  --   --   GLUCOSE 154* 131* 127*   Radiology No new imaging  Assessment & Plan:  Patient improving *GYN: biopsy results still pending. I switched her from IV to PO doxy and flagyl. I asked the patient to start wearing the pads she has in the room and to save them so GYN can take a look at them. From what I could see, her bleeding isn't much at all. Primary team feels AKI is good enough for MRI which was ordered today. Will follow up  results. I told her that determining if she has a malignancy or not is next step as the change in u/s findings over the course of 1-2 months is concerning as well as the vascularity noted.  Will continue to see daily and will touch base with patient and primary team once MRI results are back.   Patient states that she has not had a pelvic exam since 2017 when she was last a patient of Dr. Clearance Coots. Difficult exam noted by him but nothing obviously abnormal on speculum exam. Pap smear with insufficient sample; it was hpv negative.  Code Status: Full Code  Total time taking care of the patient was 30 minutes, with greater than 50% of the time spent in face to face interaction with the patient.  Cornelia Copa MD Attending Center for [facility] (Faculty Practice) GYN Consult Phone: [phone] (M-F, [phone]) & [phone] (Off hours, weekends, holidays)

## 2023-01-14 ENCOUNTER — Inpatient Hospital Stay (HOSPITAL_COMMUNITY): Payer: BC Managed Care – PPO

## 2023-01-14 ENCOUNTER — Other Ambulatory Visit (HOSPITAL_COMMUNITY): Payer: Self-pay

## 2023-01-14 DIAGNOSIS — N858 Other specified noninflammatory disorders of uterus: Secondary | ICD-10-CM | POA: Diagnosis not present

## 2023-01-14 LAB — CBC
HCT: 24.9 % — ABNORMAL LOW (ref 36.0–46.0)
Hemoglobin: 8.1 g/dL — ABNORMAL LOW (ref 12.0–15.0)
MCH: 24.3 pg — ABNORMAL LOW (ref 26.0–34.0)
MCHC: 32.5 g/dL (ref 30.0–36.0)
MCV: 74.6 fL — ABNORMAL LOW (ref 80.0–100.0)
Platelets: 511 10*3/uL — ABNORMAL HIGH (ref 150–400)
RBC: 3.34 MIL/uL — ABNORMAL LOW (ref 3.87–5.11)
RDW: 12.9 % (ref 11.5–15.5)
WBC: 8.4 10*3/uL (ref 4.0–10.5)
nRBC: 0 % (ref 0.0–0.2)

## 2023-01-14 LAB — GLUCOSE, CAPILLARY
Glucose-Capillary: 106 mg/dL — ABNORMAL HIGH (ref 70–99)
Glucose-Capillary: 118 mg/dL — ABNORMAL HIGH (ref 70–99)
Glucose-Capillary: 125 mg/dL — ABNORMAL HIGH (ref 70–99)

## 2023-01-14 MED ORDER — OXYCODONE HCL 5 MG PO TABS
5.0000 mg | ORAL_TABLET | ORAL | 0 refills | Status: DC | PRN
  Filled 2023-01-14: qty 15, 3d supply, fill #0

## 2023-01-14 MED ORDER — METRONIDAZOLE 500 MG PO TABS: 500.0000 mg | ORAL_TABLET | Freq: Two times a day (BID) | ORAL | Status: DC

## 2023-01-14 MED ORDER — FERROUS SULFATE 325 (65 FE) MG PO TABS
325.0000 mg | ORAL_TABLET | Freq: Every day | ORAL | 0 refills | Status: DC
  Filled 2023-01-14: qty 60, 60d supply, fill #0

## 2023-01-14 MED ORDER — FERROUS SULFATE 325 (65 FE) MG PO TABS: 325.0000 mg | ORAL_TABLET | Freq: Every day | ORAL | Status: DC

## 2023-01-14 MED ORDER — METRONIDAZOLE 500 MG PO TABS
500.0000 mg | ORAL_TABLET | Freq: Two times a day (BID) | ORAL | 0 refills | Status: DC
  Filled 2023-01-14: qty 60, 30d supply, fill #0

## 2023-01-14 MED ORDER — DOXYCYCLINE HYCLATE 100 MG PO TABS
100.0000 mg | ORAL_TABLET | Freq: Two times a day (BID) | ORAL | 0 refills | Status: AC
  Filled 2023-01-14: qty 22, 11d supply, fill #0

## 2023-01-14 NOTE — Progress Notes (Signed)
Explained discharge instructions to patient. Reviewed follow up appointment and next medication administration times. Also reviewed education. Patient verbalized having an understanding for instructions given. All belongings are in the patient's possession will pick up TOC meds from pharmacy on the way to the discharge lounge. IV and telemetry were removed. CCMD was notified. No other needs verbalized. Transporting downstairs to lounge to await ride.

## 2023-01-14 NOTE — Progress Notes (Addendum)
Gynecology Consult Progress Note  Admission Date: 01/11/2023 Current Date: 01/14/2023 10:07 AM  Danielle Mullins is a 62 y.o.  admitted for fevers, vaginal discharge with subsequent mass diagnosed.    History complicated by: Patient Active Problem List   Diagnosis Date Noted   Uterine mass 01/11/2023   Fever 01/11/2023   AKI (acute kidney injury) (HCC) 01/11/2023   Sepsis (HCC) 01/11/2023   Diabetes mellitus without complication (HCC)    Hypertension     ROS and patient/family/surgical history, located on admission H&P note dated 01/11/2023, have been reviewed, and there are no changes except as noted below Yesterday/Overnight Events:  none  Subjective:  Stable vaginal discharge with scant pink coloring.  Objective:    Current Vital Signs 24h Vital Sign Ranges  T 97.7 F (36.5 C) Temp  Avg: 98.9 F (37.2 C)  Min: 97.7 F (36.5 C)  Max: 100.7 F (38.2 C)  BP 109/73 BP  Min: 106/66  Max: 141/78  HR 95 Pulse  Avg: 84  Min: 78  Max: 95  RR 18 Resp  Avg: 18  Min: 18  Max: 18  SaO2 99 % Room Air SpO2  Avg: 96.7 %  Min: 92 %  Max: 99 %       24 Hour I/O Current Shift I/O  Time Ins Outs 12/12 0701 - 12/13 0700 In: 340 [P.O.:340] Out: 2  12/13 0701 - 12/13 1900 In: 60 [P.O.:60] Out: -    Physical exam: General appearance: alert, cooperative, and appears stated age Abdomen:  soft, nttp GU: pad examined and same as yesterday Lungs:  no respiratory distress Skin: warm and dry Psych: appropriate Neurologic: Grossly normal  Medications Current Facility-Administered Medications  Medication Dose Route Frequency Provider Last Rate Last Admin   acetaminophen (TYLENOL) tablet 1,000 mg  1,000 mg Oral Q6H PRN Dolly Rias, MD   1,000 mg at 01/14/23 0836   cefTRIAXone (ROCEPHIN) 2 g in sodium chloride 0.9 % 100 mL IVPB  2 g Intravenous Q24H Adhikari, Amrit, MD 200 mL/hr at 01/13/23 2302 2 g at 01/13/23 2302   doxycycline (VIBRA-TABS) tablet 100 mg  100 mg Oral Q12H Carleton Bing, MD   100 mg at 01/14/23 1610   gabapentin (NEURONTIN) capsule 100 mg  100 mg Oral QHS PRN Dolly Rias, MD   100 mg at 01/12/23 2142   insulin aspart (novoLOG) injection 0-6 Units  0-6 Units Subcutaneous TID WC Segars, Christiane Ha, MD       metroNIDAZOLE (FLAGYL) tablet 500 mg  500 mg Oral Q12H Normandy Bing, MD   500 mg at 01/14/23 9604   oxyCODONE (Oxy IR/ROXICODONE) immediate release tablet 5 mg  5 mg Oral Q4H PRN Opyd, Lavone Neri, MD   5 mg at 01/13/23 1722   simvastatin (ZOCOR) tablet 40 mg  40 mg Oral QHS Dolly Rias, MD   40 mg at 01/13/23 2252   sodium chloride flush (NS) 0.9 % injection 3 mL  3 mL Intravenous Q12H Dolly Rias, MD   3 mL at 01/14/23 0843   Labs  Biopsy pathology pending BCx pending, NGTD Recent Labs  Lab 01/12/23 0750 01/13/23 0231 01/14/23 0238  WBC 9.3 9.5 8.4  HGB 7.8* 7.7* 8.1*  HCT 24.2* 24.1* 24.9*  PLT 474* 498* 511*    Recent Labs  Lab 01/11/23 1427 01/12/23 0428 01/13/23 0231  NA 133* 134* 136  K 4.1 3.5 3.6  CL 99 105 106  CO2 19* 24 22  BUN 24* 26* 11  CREATININE 1.76* 1.80* 1.04*  CALCIUM 9.2 8.2* 8.2*  PROT 8.1  --   --   BILITOT 0.9  --   --   ALKPHOS 63  --   --   ALT 15  --   --   AST 24  --   --   GLUCOSE 154* 131* 127*   Radiology Narrative & Impression  CLINICAL DATA:  Vaginal bleeding   EXAM: MRI PELVIS WITHOUT AND WITH CONTRAST   TECHNIQUE: Multiplanar multisequence MR imaging of the pelvis was performed both before and after administration of intravenous contrast.   CONTRAST:  9mL GADAVIST GADOBUTROL 1 MMOL/ML IV SOLN   COMPARISON:  Pelvic ultrasound dated 01/11/2023   FINDINGS: Urinary Tract:  Bladder is underdistended but unremarkable.   Bowel:  Visualized bowel is unremarkable.   Vascular/Lymphatic: No evidence of aneurysm.   9 mm short axis right pelvic sidewall node (series 6/image 25). 9 mm short axis left external iliac node. Small bilateral inguinal nodes measuring up to 10 mm  short axis on the right (series 6/image 30).   Reproductive: 8.2 x 7.6 x 12.6 cm endocervical mass, extending to the uterine fundus. Myometrial invasion involving the anterior lower uterine segment (series 7/image 22). No definite extension to the upper 3rd of the vagina. No parametrial/serosal extension.   Additional uterine fibroids.   Bilateral ovaries are within normal limits.   Other:  Small volume pelvic ascites.   Musculoskeletal: Benign nonenhancing lesion in the left iliac bone (series 19/image 40). Mild degenerative changes of the lower lumbar spine.   IMPRESSION: 12.6 cm endocervical mass, extending to the uterine fundus, as above. Myometrial invasion involving the anterior lower uterine segment. No parametrial/serosal extension. Correlate with pending biopsy.   Mildly prominent bilateral pelvic/inguinal nodes, indeterminate but suspicious for small nodal metastases.   Small volume pelvic ascites.     Electronically Signed   By: Charline Bills M.D.   On: 01/13/2023 22:41    Assessment & Plan:  Patient improving *GYN: From a GYN standpoint, she is fine to go home to finish out a 14 day course of doxy and flagyl for ?endometritis. I talked to Gyn Onc Dr. Tamela Oddi and she is to arrange follow up for next week.   Formal chest x-ray ordered on patient for screening purposes. I touched based with the primary team with plan of care.   Code Status: Full Code  Total time taking care of the patient was 30 minutes, with greater than 50% of the time spent in face to face interaction with the patient.  Cornelia Copa MD Attending Center for [facility] (Faculty Practice) GYN Consult Phone: [phone] (M-F, [phone]) & [phone] (Off hours, weekends, holidays)

## 2023-01-14 NOTE — Plan of Care (Signed)

## 2023-01-14 NOTE — TOC Transition Note (Signed)
Transition of Care [facility]) - Discharge Note   Patient Details  Name: Danielle Mullins MRN: [id number] Date of Birth: 03-30-1960  Transition of Care [facility]) CM/SW Contact:  Leone Haven, RN Phone Number: 01/14/2023, 11:21 AM   Clinical Narrative:    For dc today, just had cxr done awaiting results before can be dc per Staff RN.  Patient states she will call her brother to come to transport her home when she is cleared for dc.     Barriers to Discharge: Continued Medical Work up   Patient Goals and CMS Choice Patient states their goals for this hospitalization and ongoing recovery are:: return home   Choice offered to / list presented to : NA      Discharge Placement                       Discharge Plan and Services Additional resources added to the After Visit Summary for   In-house Referral: NA Discharge Planning Services: CM Consult Post Acute Care Choice: NA          DME Arranged: N/A DME Agency: NA       HH Arranged: NA          Social Drivers of Health (SDOH) Interventions SDOH Screenings   Food Insecurity: No Food Insecurity (01/12/2023)  Housing: Low Risk  (01/12/2023)  Transportation Needs: No Transportation Needs (01/12/2023)  Utilities: Not At Risk (01/12/2023)  Tobacco Use: Low Risk  (05/21/2022)     Readmission Risk Interventions     No data to display

## 2023-01-14 NOTE — Discharge Summary (Signed)
Physician Discharge Summary  Danielle Mullins BMW:[id number] DOB: 01/12/1961 DOA: 01/11/2023  PCP: Mirna Mires, MD  Admit date: 01/11/2023 Discharge date: 01/14/2023  Admitted From: Home Disposition:  Home  Discharge Condition:Stable CODE STATUS:FULL Diet recommendation: Regular  Brief/Interim Summary: Patient is a 62 year old female with history of hypertension, hyperlipidemia, OSA not on CPAP, spondylosis who presented with persistent vaginal discharge which has recently turned into yellow/orange.  Report of chills at home.  On presentation, she was febrile, hypotensive, tachycardic.  Work showed AKI with creatinine of 1.7.  Ultrasound of the pelvis showed 7.8 cm hypervascular mass within the lower uterine segment, extending toward the cervix. S/P biopsy. Started on broad-spectrum antibiotics.  GYN consulted.  MRI showed 12.6 centimeter endocervical mass extending to the fundus, surrounding lymph node enlargement.  GYN cleared for discharge today to home, she will follow-up with GYN oncology as an outpatient, she will be called for appointment.  Continue antibiotics on discharge.  Medically stable for discharge today.  Following problems were addressed during the hospitalization:  Uterine mass/endometritis: Presented with vaginal discharge, febrile, tachycardic.  Found to have friable necrotic appearing mass of the upper vaginal vault.  Biopsied,result pending.  Ultrasound of the pelvis as above.  Started  on broad spectrum antibiotics, being treated for endometritis.   MRI of the pelvis showed 12.6 centimeter endocervical mass extending to the fundus, surrounding lymph node enlargement.  Outpatient appointment with  GYN oncology already set up.  She will continue oral antibiotics on discharge to complete 14 days of treatment for suspected endometritis.  Cultures have remained negative so far.   AKI: Baseline creatinine normal.  Presented with creatinine in the range of 1.7.   Renal ultrasound  did not show any obstructive pathology AKI resolved   Iron deficiency anemia: Most likely from chronic bleeding from the uterine mass.  Hemoglobin in the range of 7.  Iron studies showed low iron.  Given  IV iron.  Continue oral iron supplementation on discharge   Hypertension: Currently blood pressure stable.  Home medications lisinopril, hydrochlorothiazide on hold   Diabetes type 2: On metformin at home.     Hyperlipidemia: Continue on simvastatin   Obesity: BMI of 31.7  Discharge Diagnoses:  Principal Problem:   Uterine mass Active Problems:   Diabetes mellitus without complication (HCC)   Hypertension   Fever   AKI (acute kidney injury) (HCC)   Sepsis [facility])    Discharge Instructions  Discharge Instructions     Diet general   Complete by: As directed    Discharge instructions   Complete by: As directed    1)Please take prescribed medications as instructed 2)You will be called by GYN oncology for outpatient appointment 3)Follow up with your PCP in a week   Increase activity slowly   Complete by: As directed       Allergies as of 01/14/2023       Reactions   Celecoxib Swelling        Medication List     STOP taking these medications    Fusion Plus Caps   lisinopril-hydrochlorothiazide 10-12.5 MG tablet Commonly known as: ZESTORETIC       TAKE these medications    doxycycline 100 MG tablet Commonly known as: VIBRA-TABS Take 1 tablet (100 mg total) by mouth every 12 (twelve) hours for 11 days.   ferrous sulfate 325 (65 FE) MG tablet Take 1 tablet (325 mg total) by mouth daily with breakfast. Start taking on: January 15, 2023   gabapentin 100  MG capsule Commonly known as: NEURONTIN Take 100 mg by mouth at bedtime as needed.   metFORMIN 500 MG tablet Commonly known as: GLUCOPHAGE Take 500 mg by mouth 2 (two) times daily with a meal.   metroNIDAZOLE 500 MG tablet Commonly known as: FLAGYL Take 1 tablet (500 mg total) by mouth every 12  (twelve) hours.   oxyCODONE 5 MG immediate release tablet Commonly known as: Oxy IR/ROXICODONE Take 1 tablet (5 mg total) by mouth every 4 (four) hours as needed for severe pain (pain score 7-10).   Rybelsus 7 MG Tabs Generic drug: Semaglutide Take 1 tablet by mouth daily.   simvastatin 40 MG tablet Commonly known as: ZOCOR Take 40 mg by mouth at bedtime.        Follow-up Information     Mirna Mires, MD. Schedule an appointment as soon as possible for a visit in 1 week(s).   Specialty: Family Medicine Contact information: [address] [place] [address] [phone]                Allergies  Allergen Reactions   Celecoxib Swelling    Consultations: Gynecology   Procedures/Studies: MR PELVIS W WO CONTRAST Result Date: 01/13/2023 CLINICAL DATA:  Vaginal bleeding EXAM: MRI PELVIS WITHOUT AND WITH CONTRAST TECHNIQUE: Multiplanar multisequence MR imaging of the pelvis was performed both before and after administration of intravenous contrast. CONTRAST:  9mL GADAVIST GADOBUTROL 1 MMOL/ML IV SOLN COMPARISON:  Pelvic ultrasound dated 01/11/2023 FINDINGS: Urinary Tract:  Bladder is underdistended but unremarkable. Bowel:  Visualized bowel is unremarkable. Vascular/Lymphatic: No evidence of aneurysm. 9 mm short axis right pelvic sidewall node (series 6/image 25). 9 mm short axis left external iliac node. Small bilateral inguinal nodes measuring up to 10 mm short axis on the right (series 6/image 30). Reproductive: 8.2 x 7.6 x 12.6 cm endocervical mass, extending to the uterine fundus. Myometrial invasion involving the anterior lower uterine segment (series 7/image 22). No definite extension to the upper 3rd of the vagina. No parametrial/serosal extension. Additional uterine fibroids. Bilateral ovaries are within normal limits. Other:  Small volume pelvic ascites. Musculoskeletal: Benign nonenhancing lesion in the left iliac bone (series 19/image 40). Mild degenerative  changes of the lower lumbar spine. IMPRESSION: 12.6 cm endocervical mass, extending to the uterine fundus, as above. Myometrial invasion involving the anterior lower uterine segment. No parametrial/serosal extension. Correlate with pending biopsy. Mildly prominent bilateral pelvic/inguinal nodes, indeterminate but suspicious for small nodal metastases. Small volume pelvic ascites. Electronically Signed   By: Charline Bills M.D.   On: 01/13/2023 22:41   US RENAL Result Date: 01/12/2023 CLINICAL DATA:  Acute kidney injury EXAM: RENAL / URINARY TRACT ULTRASOUND COMPLETE COMPARISON:  12/14/2022 FINDINGS: Right Kidney: Renal measurements: 10.9 x 4.1 x 4.1 cm = volume: 95 mL. Echogenicity within normal limits. No mass or hydronephrosis visualized. Left Kidney: Renal measurements: 11.8 x 5.9 x 4.0 cm = volume: 148 mL. Echogenicity within normal limits. No mass or hydronephrosis visualized. Bladder: Appears normal for degree of bladder distention. Other: None. IMPRESSION: Normal renal ultrasound. Electronically Signed   By: Charline Bills M.D.   On: 01/12/2023 00:27   DG CHEST PORT 1 VIEW Result Date: 01/11/2023 CLINICAL DATA:  Sepsis EXAM: PORTABLE CHEST 1 VIEW COMPARISON:  03/24/2020 FINDINGS: The heart size and mediastinal contours are within normal limits. Both lungs are clear. The visualized skeletal structures are unremarkable. IMPRESSION: No active disease. Electronically Signed   By: Charlett Nose M.D.   On: 01/11/2023  23:46   US PELVIC COMPLETE WITH TRANSVAGINAL Result Date: 01/11/2023 CLINICAL DATA:  Vaginal discharge for 8 months EXAM: TRANSABDOMINAL AND TRANSVAGINAL ULTRASOUND OF PELVIS TECHNIQUE: Both transabdominal and transvaginal ultrasound examinations of the pelvis were performed. Transabdominal technique was performed for global imaging of the pelvis including uterus, ovaries, adnexal regions, and pelvic cul-de-sac. It was necessary to proceed with endovaginal exam following the  transabdominal exam to visualize the endometrium and adnexal structures. COMPARISON:  12/14/2022 FINDINGS: Uterus Measurements: 15.3 x 5.4 x 7.1 cm = volume: 380.4 mL. Uterus is enlarged with diffuse heterogeneity suggesting underlying fibroids. Enlarged mass is again identified within the lower uterine segment extending toward the cervix, measuring 7.8 x 6.0 by 7.2 cm. There is significant internal vascularity with central cystic areas possibly representing necrosis or venous lakes. Doppler images suggest possible vascular stalk common this could reflect a prolapsed submucosal fibroid or endometrial polyp. Endometrium Not visualized. Right ovary Not visualized. Left ovary Not visualized. Other findings No free fluid. IMPRESSION: 1. 7.8 cm hypervascular mass within the lower uterine segment, extending toward the cervix, with evidence of a vascular stalk on Doppler imaging. Differential would include endometrial mass such as polyp versus pedunculated submucosal fibroid. Dedicated nonemergent pelvic MRI with and without contrast is recommended. 2. Nonvisualization of the ovaries and endometrium. Electronically Signed   By: Sharlet Salina M.D.   On: 01/11/2023 17:12      Subjective: Patient seen and examined bedside today.  Hemodynamically stable for discharge.  Still having some vaginal discharge but denies any abdomen pain or vaginal bleeding.  Feels ready to go home  Discharge Exam: Vitals:   01/14/23 0314 01/14/23 0900  BP: 107/70 109/73  Pulse: 78 95  Resp: 18 18  Temp: 98.6 F (37 C) 97.7 F (36.5 C)  SpO2: 98% 99%   Vitals:   01/14/23 0000 01/14/23 0314 01/14/23 0500 01/14/23 0900  BP: (!) 141/78 107/70  109/73  Pulse:  78  95  Resp: 18 18  18   Temp: 99.1 F (37.3 C) 98.6 F (37 C)  97.7 F (36.5 C)  TempSrc: Oral Oral  Oral  SpO2: 99% 98%  99%  Weight:   90.7 kg   Height:        General: Pt is alert, awake, not in acute distress Cardiovascular: RRR, S1/S2 +, no rubs, no  gallops Respiratory: CTA bilaterally, no wheezing, no rhonchi Abdominal: Soft, NT, ND, bowel sounds + Extremities: no edema, no cyanosis    The results of significant diagnostics from this hospitalization (including imaging, microbiology, ancillary and laboratory) are listed below for reference.     Microbiology: Recent Results (from the past 240 hours)  Wet prep, genital     Status: Abnormal   Collection Time: 01/11/23  9:25 PM  Result Value Ref Range Status   Yeast Wet Prep HPF POC NONE SEEN NONE SEEN Final   Trich, Wet Prep NONE SEEN NONE SEEN Final   Clue Cells Wet Prep HPF POC NONE SEEN NONE SEEN Final   WBC, Wet Prep HPF POC >=10 (A) <10 Final   Sperm NONE SEEN  Final    Comment: Performed at [facility], [address] [place], [place] [address]  Blood culture (routine x 2)     Status: None (Preliminary result)   Collection Time: 01/11/23 11:02 PM   Specimen: BLOOD  Result Value Ref Range Status   Specimen Description BLOOD RIGHT ANTECUBITAL  Final   Special Requests   Final  BOTTLES DRAWN AEROBIC AND ANAEROBIC Blood Culture adequate volume   Culture   Final    NO GROWTH 3 DAYS Performed at [facility], [address], [place], [place] [address]    Report Status PENDING  Incomplete  Blood culture (routine x 2)     Status: None (Preliminary result)   Collection Time: 01/12/23  1:28 AM   Specimen: BLOOD RIGHT ARM  Result Value Ref Range Status   Specimen Description BLOOD RIGHT ARM  Final   Special Requests   Final    BOTTLES DRAWN AEROBIC AND ANAEROBIC Blood Culture results may not be optimal due to an inadequate volume of blood received in culture bottles   Culture   Final    NO GROWTH 2 DAYS Performed at [facility], [address] [place], [place] [address]    Report Status PENDING  Incomplete     Labs: BNP (last 3 results) No results for input(s): "BNP" in the last 8760 hours. Basic Metabolic Panel: Recent Labs  Lab 01/11/23 1427  01/12/23 0428 01/13/23 0231  NA 133* 134* 136  K 4.1 3.5 3.6  CL 99 105 106  CO2 19* 24 22  GLUCOSE 154* 131* 127*  BUN 24* 26* 11  CREATININE 1.76* 1.80* 1.04*  CALCIUM 9.2 8.2* 8.2*  MG  --  1.7  --   PHOS  --  3.4  --    Liver Function Tests: Recent Labs  Lab 01/11/23 1427  AST 24  ALT 15  ALKPHOS 63  BILITOT 0.9  PROT 8.1  ALBUMIN 3.0*   Recent Labs  Lab 01/11/23 1427  LIPASE 29   No results for input(s): "AMMONIA" in the last 168 hours. CBC: Recent Labs  Lab 01/11/23 1427 01/12/23 0750 01/13/23 0231 01/14/23 0238  WBC 9.8 9.3 9.5 8.4  NEUTROABS 7.7  --   --   --   HGB 10.4* 7.8* 7.7* 8.1*  HCT 35.2* 24.2* 24.1* 24.9*  MCV 83.4 75.6* 75.5* 74.6*  PLT 530* 474* 498* 511*   Cardiac Enzymes: No results for input(s): "CKTOTAL", "CKMB", "CKMBINDEX", "TROPONINI" in the last 168 hours. BNP: Invalid input(s): "POCBNP" CBG: Recent Labs  Lab 01/13/23 0616 01/13/23 1145 01/13/23 1542 01/14/23 0000 01/14/23 0554  GLUCAP 102* 117* 110* 125* 118*   D-Dimer No results for input(s): "DDIMER" in the last 72 hours. Hgb A1c Recent Labs    01/12/23 0428  HGBA1C 6.3*   Lipid Profile No results for input(s): "CHOL", "HDL", "LDLCALC", "TRIG", "CHOLHDL", "LDLDIRECT" in the last 72 hours. Thyroid function studies No results for input(s): "TSH", "T4TOTAL", "T3FREE", "THYROIDAB" in the last 72 hours.  Invalid input(s): "FREET3" Anemia work up Recent Labs    01/12/23 0750  VITAMINB12 369  FERRITIN 284  TIBC 199*  IRON 10*   Urinalysis    Component Value Date/Time   COLORURINE AMBER (A) 01/11/2023 1427   APPEARANCEUR TURBID (A) 01/11/2023 1427   LABSPEC 1.021 01/11/2023 1427   PHURINE 5.0 01/11/2023 1427   GLUCOSEU NEGATIVE 01/11/2023 1427   HGBUR LARGE (A) 01/11/2023 1427   BILIRUBINUR NEGATIVE 01/11/2023 1427   KETONESUR 5 (A) 01/11/2023 1427   PROTEINUR 100 (A) 01/11/2023 1427   NITRITE NEGATIVE 01/11/2023 1427   LEUKOCYTESUR MODERATE (A)  01/11/2023 1427   Sepsis Labs Recent Labs  Lab 01/11/23 1427 01/12/23 0750 01/13/23 0231 01/14/23 0238  WBC 9.8 9.3 9.5 8.4   Microbiology Recent Results (from the past 240 hours)  Wet prep, genital  Status: Abnormal   Collection Time: 01/11/23  9:25 PM  Result Value Ref Range Status   Yeast Wet Prep HPF POC NONE SEEN NONE SEEN Final   Trich, Wet Prep NONE SEEN NONE SEEN Final   Clue Cells Wet Prep HPF POC NONE SEEN NONE SEEN Final   WBC, Wet Prep HPF POC >=10 (A) <10 Final   Sperm NONE SEEN  Final    Comment: Performed at [facility], [address] [place], [place] [address]  Blood culture (routine x 2)     Status: None (Preliminary result)   Collection Time: 01/11/23 11:02 PM   Specimen: BLOOD  Result Value Ref Range Status   Specimen Description BLOOD RIGHT ANTECUBITAL  Final   Special Requests   Final    BOTTLES DRAWN AEROBIC AND ANAEROBIC Blood Culture adequate volume   Culture   Final    NO GROWTH 3 DAYS Performed at [facility], [address] [place], [place] [address]    Report Status PENDING  Incomplete  Blood culture (routine x 2)     Status: None (Preliminary result)   Collection Time: 01/12/23  1:28 AM   Specimen: BLOOD RIGHT ARM  Result Value Ref Range Status   Specimen Description BLOOD RIGHT ARM  Final   Special Requests   Final    BOTTLES DRAWN AEROBIC AND ANAEROBIC Blood Culture results may not be optimal due to an inadequate volume of blood received in culture bottles   Culture   Final    NO GROWTH 2 DAYS Performed at [facility], [address], [place], [place] [address]    Report Status PENDING  Incomplete    Please note: You were cared for by a hospitalist during your hospital stay. Once you are discharged, your primary care physician will handle any further medical issues. Please note that NO REFILLS for any discharge medications will be authorized once you are discharged, as it is imperative that you return to your  primary care physician (or establish a relationship with a primary care physician if you do not have one) for your post hospital discharge needs so that they can reassess your need for medications and monitor your lab values.    Time coordinating discharge: 40 minutes  SIGNED:   Burnadette Pop, MD  [facility] 01/14/2023, 11:03 AM Pager [phone]  If 7PM-7AM, please contact night-coverage [email or web address] Password [email or web address]

## 2023-01-14 NOTE — Progress Notes (Addendum)
On reassessment at midnight pt reported that she vomited x 2, this happened an hour after night meds were given. Per pt emesis content was yellow and clear.  Antiemesis medication refused by pt at this time. Pt instructed to call for assistance if she experience nausea again. Will continue to monitor pt closely.

## 2023-01-16 LAB — CULTURE, BLOOD (ROUTINE X 2)
Culture: NO GROWTH
Special Requests: ADEQUATE

## 2023-01-17 LAB — CULTURE, BLOOD (ROUTINE X 2): Culture: NO GROWTH

## 2023-01-18 LAB — SURGICAL PATHOLOGY

## 2023-01-19 ENCOUNTER — Telehealth: Payer: Self-pay | Admitting: *Deleted

## 2023-01-19 ENCOUNTER — Encounter: Payer: Self-pay | Admitting: Gynecologic Oncology

## 2023-01-19 NOTE — Telephone Encounter (Signed)
Spoke with the patient regarding the referral to GYN oncology. Patient scheduled as new patient with  Dr Pricilla Holm on 12/20 at 9 am. Patient given an arrival time of 8:30 am.  Explained to the patient the the doctor will perform a pelvic exam at this visit. Patient given the policy that only one visitor allowed and that visitor must be over 16 yrs are allowed in the Cancer Center. Patient given the address/phone number for the clinic and that the center offers free valet service. Patient aware that masks are option.

## 2023-01-20 DIAGNOSIS — C541 Malignant neoplasm of endometrium: Secondary | ICD-10-CM | POA: Insufficient documentation

## 2023-01-20 NOTE — Progress Notes (Unsigned)
GYNECOLOGIC ONCOLOGY NEW PATIENT CONSULTATION   Patient Name: Danielle Mullins  Patient Age: 62 y.o. Date of Service: 01/21/23 Referring Provider: Dr. 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  Discussed findings on her exam today which show a mass prolapsing through her cervix into her vagina.  Given her poor tolerance of exam, I am unable to feel her cervix but I do not feel any vaginal involvement and I suspect this mass is uterine in origin prolapsing through a dilated cervix.  We discussed that differentiating whether this is leiomyosarcoma versus carcinosarcoma is important because the treatment regimens for these 2 types of cancers is different.  I worry that taking additional sampling of the necrotic prolapsing mass may not yield a different result than her recent biopsy.  I recommend that we proceed with surgery both to help stop her bleeding and discharge but also to achieve a diagnosis.  In the setting of her high risk histology, discussed getting abdominal and chest imaging to rule out metastatic disease.  MRI shows mild prominent bilateral retroperitoneal lymph nodes which are indeterminant for metastatic disease.  We reviewed the nature of endometrial cancer and its recommended surgical staging, including total hysterectomy, bilateral salpingo-oophorectomy, and lymph node assessment.  Given the size of her uterus and dilated cervix, I do not know that she will be a candidate for minimally invasive surgery.  We will plan to start with diagnostic laparoscopy and after laparoscopic assessment and exam under anesthesia, will decide whether to proceed with robotic surgery with mini laparotomy for specimen removal versus surgery via an open approach.  Although I would recommend performing  injection with ICG for sentinel lymph node mapping, given dilated cervix and mass filling the upper vagina, I do not think this will be feasible.  We discussed that lymph nodes may need to be removed at the time of surgery if they are enlarged.  We discussed that most endometrial cancer is detected early, however, we reviewed that adjuvant therapy will likely be recommended based on the patient's biopsy, however, we will defer to final pathology results.     The patient was consented for a diagnostic laparoscopy, robotic assisted vs. open hysterectomy, bilateral salpingo-oophorectomy, possible lymph node dissection, possible omentectomy. The risks of surgery were discussed in detail and she understands these to include infection; wound separation; hernia; vaginal cuff separation, injury to adjacent organs such as bowel, bladder, blood vessels, ureters and nerves; bleeding which may require blood transfusion; anesthesia risk; thromboembolic events; possible death; unforeseen complications; possible need for re-exploration; medical complications such as heart attack, stroke, pleural effusion and pneumonia; and, if full lymphadenectomy is performed the risk of lymphedema and lymphocyst. The patient will receive DVT and antibiotic prophylaxis as indicated. She voiced a clear understanding. She had the opportunity to ask questions. Perioperative instructions were reviewed with her. Prescriptions for post-op medications were sent to her pharmacy of choice.  Will reach out to her primary care provider for clearance and to ask about need for cardiac clearance.  Discussed increased risk of VTE in the postoperative period.  Will plan on checking whether there is a significant cost difference for Lovenox versus DOAC. Will need 2-4 weeks depending on whether MIS or open surgery.  A copy of this note was sent to the patient's referring provider.   70 minutes of total time was spent for this  patient encounter,  including preparation, face-to-face counseling with the patient and coordination of care, and documentation of the encounter.  Eugene Garnet, MD  Division of Gynecologic Oncology  Department of Obstetrics and Gynecology  [facility]  ___________________________________________  Chief Complaint: Chief Complaint  Patient presents with   leiomyosarcoma    History of Present Illness:  Danielle Mullins is a 62 y.o. y.o. female who is seen in consultation at the request of Dr. Renford Dills for newly diagnosed uterine cancer.  The patient endorses light vaginal bleeding and discharge that started in October.  She endorses fatigue starting around the same time 2.  She denies any heavy bleeding but notes that bleeding has been similar to the beginning of her menstrual cycle.  The discharge that she had at first had no smell but then around Thanksgiving became foul-smelling.  Since being in the hospital, she denies smell with the discharge.  She is still taking antibiotics.  She denies any nausea or emesis, denies fevers or chills.  She endorses normal bowel bladder function.  She has had a decreased appetite for the last several months and has lost approximately 20 pounds per her report (started at 69).   CEA <2 CA-125 12  Treatment History: Oncology History Overview Note  Presented initially with PMB/discharge starting in 11/2022. After discharge became purulent in appearance, she presented to ED and was admitted from 12/11-12/13/24. Mass was noted superiorly within the vagina, necrotic in appearance. Treatment for PID was recommended.    Endometrial cancer (HCC)  12/14/2022 Imaging   Pelvic ultrasound:  1. Enlarged and heterogeneous postmenopausal uterus with multiple fibroids. 2. Echogenic 2.4 cm ovoid lesion within the mid uterus. This may represent a fibroid versus endometrial pathology as the endometrium is not identified on today's exam. Multiphase MRI of the  pelvis could be obtained for further evaluation, as clinically indicated. 3.  Nonvisualization of the bilateral ovaries.   01/11/2023 Imaging   Pelvic ultrasound: 1. 7.8 cm hypervascular mass within the lower uterine segment, extending toward the cervix, with evidence of a vascular stalk on Doppler imaging. Differential would include endometrial mass such as polyp versus pedunculated submucosal fibroid. Dedicated nonemergent pelvic MRI with and without contrast is recommended. 2. Nonvisualization of the ovaries and endometrium   01/11/2023 Initial Biopsy   Biopsy of cervical mass - Malignant smooth muscle tumor with necrosis, consistent with leiomyosarcoma, see comment  Differential diagnosis can include a carcinosarcoma.     01/13/2023 Imaging   MRI pelvis: 12.6 cm endocervical mass, extending to the uterine fundus, as above. Myometrial invasion involving the anterior lower uterine segment. No parametrial/serosal extension. Correlate with pending biopsy. Mildly prominent bilateral pelvic/inguinal nodes, indeterminate but suspicious for small nodal metastases. Small volume pelvic ascites.   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 No pertinent surgical history.  OB/GYN HISTORY:  OB History  Gravida Para Term Preterm AB Living  0 0 0 0 0 0  SAB IAB Ectopic Multiple Live Births  0 0 0 0 0    No LMP recorded. Patient is postmenopausal.  Age at menarche: 62  Age at menopause: 46 Hx of HRT:  denies Hx of STDs: denies Last pap: unsure History of abnormal pap smears: does not think so  SCREENING STUDIES:  Last mammogram: years ago  Last colonoscopy: has never had  MEDICATIONS: Outpatient Encounter Medications as of 01/21/2023  Medication Sig   doxycycline (VIBRA-TABS) 100 MG tablet Take 1 tablet (100 mg total) by mouth every 12 (twelve) hours for 11 days.   ferrous sulfate 325 (65 FE) MG tablet Take 1 tablet (325 mg total) by mouth daily with breakfast.   gabapentin (NEURONTIN) 100 MG capsule Take 100 mg by mouth at bedtime as needed.   metFORMIN (GLUCOPHAGE) 500 MG tablet Take 500 mg by mouth 2 (two) times daily with a meal.   metroNIDAZOLE (FLAGYL) 500 MG tablet Take 1 tablet (500 mg total) by mouth every 12 (twelve) hours.   oxyCODONE (OXY IR/ROXICODONE) 5 MG immediate release tablet Take 1 tablet (5 mg total) by mouth every 4 (four) hours as needed for severe pain (pain score 7-10).   RYBELSUS 7 MG TABS Take 1 tablet by mouth daily.   simvastatin (ZOCOR) 40 MG tablet Take 40 mg by mouth at bedtime.   No facility-administered encounter medications on file as of 01/21/2023.    ALLERGIES:  Allergies  Allergen Reactions   Celecoxib Swelling     FAMILY HISTORY:  Family History  Problem Relation Age of Onset   Diabetes Mother    Hypertension Mother    Heart disease Father    Hypertension Father    Diabetes Brother    Breast cancer Neg Hx    Prostate cancer Neg Hx    Colon cancer Neg Hx    Endometrial cancer Neg Hx    Ovarian cancer Neg Hx    Pancreatic cancer Neg Hx    Cancer - Ovarian Neg Hx      SOCIAL HISTORY:  Social Connections: Not on file    REVIEW OF SYSTEMS:  + Appetite changes, fatigue, vaginal bleeding, discharge Denies fevers, chills, unexplained weight changes. Denies hearing loss, neck lumps or masses, mouth sores, ringing in ears or voice changes. Denies cough or wheezing.  Denies shortness of breath. Denies chest pain or palpitations.  Denies leg swelling. Denies abdominal distention, pain, blood in stools, constipation, diarrhea, nausea, vomiting, or early satiety. Denies pain with intercourse, dysuria, frequency, hematuria or incontinence. Denies hot flashes, pelvic pain.   Denies joint pain, back pain or muscle pain/cramps. Denies itching, rash, or wounds. Denies dizziness, headaches, numbness or seizures. Denies swollen lymph nodes or glands, denies easy bruising or bleeding. Denies anxiety, depression, confusion, or decreased concentration.  Physical Exam:  Vital Signs for this encounter:  Blood pressure 125/76, pulse 88, temperature 98.8 F (37.1 C), temperature source Oral, resp. rate 16, height 5\' 6"  (1.676 m), weight 191 lb (86.6 kg). Body mass index is 30.83 kg/m. General: Alert, oriented, no acute distress.  HEENT: Normocephalic, atraumatic. Sclera anicteric.  Chest: Clear to auscultation bilaterally. No wheezes, rhonchi, or rales. Cardiovascular: Regular rate and rhythm, no murmurs, rubs, or gallops.  Abdomen: Normoactive bowel sounds. Soft, nondistended, nontender to palpation. No masses or hepatosplenomegaly appreciated. No palpable fluid wave.  Extremities: Grossly normal range of motion. Warm, well perfused. No edema bilaterally.  Skin: No rashes or lesions.  Lymphatics: No cervical, supraclavicular, or inguinal adenopathy.  GU:  Normal external female genitalia. No lesions. No  discharge or bleeding.  Some serosanguineous discharge noted on the perineum.             Vagina: Very limited exam given poor toleration of speculum.  On 1 digit exam, vagina feels smooth with along the distal 5 cm that I was able to palpate.             Cervix: Unable to visualize or palpate.  There is a mass presumably prolapsing through the cervix that fills the vagina without direct extension or involvement of the vagina along her distal vagina.             Uterus: Exam limited given poorly tolerated exam.                Adnexa: Unable to evaluate.  Rectal: Deferred.  LABORATORY AND RADIOLOGIC DATA:  Outside medical records were reviewed to synthesize the above history, along with the history and physical obtained during the visit.   Lab Results  Component Value Date   WBC 8.4 01/14/2023   HGB 8.1 (L) 01/14/2023   HCT 24.9 (L) 01/14/2023   PLT 511 (H) 01/14/2023   GLUCOSE 127 (H) 01/13/2023   CHOL 175 03/24/2020   TRIG 56 03/24/2020   HDL 35 (L) 03/24/2020   LDLCALC 129 (H) 03/24/2020   ALT 15 01/11/2023   AST 24 01/11/2023   NA 136 01/13/2023   K 3.6 01/13/2023   CL 106 01/13/2023   CREATININE 1.04 (H) 01/13/2023   BUN 11 01/13/2023   CO2 22 01/13/2023   TSH 2.696 03/24/2020   INR 1.3 (H) 01/12/2023   HGBA1C 6.3 (H) 01/12/2023

## 2023-01-21 ENCOUNTER — Inpatient Hospital Stay: Payer: BC Managed Care – PPO | Admitting: Gynecologic Oncology

## 2023-01-21 ENCOUNTER — Inpatient Hospital Stay: Payer: BC Managed Care – PPO | Attending: Gynecologic Oncology | Admitting: Gynecologic Oncology

## 2023-01-21 ENCOUNTER — Encounter: Payer: Self-pay | Admitting: Gynecologic Oncology

## 2023-01-21 VITALS — BP 125/76 | HR 88 | Temp 98.8°F | Resp 16 | Ht 66.0 in | Wt 191.0 lb

## 2023-01-21 DIAGNOSIS — I517 Cardiomegaly: Secondary | ICD-10-CM | POA: Insufficient documentation

## 2023-01-21 DIAGNOSIS — Z79899 Other long term (current) drug therapy: Secondary | ICD-10-CM | POA: Diagnosis not present

## 2023-01-21 DIAGNOSIS — I441 Atrioventricular block, second degree: Secondary | ICD-10-CM | POA: Diagnosis not present

## 2023-01-21 DIAGNOSIS — N950 Postmenopausal bleeding: Secondary | ICD-10-CM | POA: Diagnosis not present

## 2023-01-21 DIAGNOSIS — R634 Abnormal weight loss: Secondary | ICD-10-CM | POA: Diagnosis not present

## 2023-01-21 DIAGNOSIS — C499 Malignant neoplasm of connective and soft tissue, unspecified: Secondary | ICD-10-CM

## 2023-01-21 DIAGNOSIS — E119 Type 2 diabetes mellitus without complications: Secondary | ICD-10-CM | POA: Diagnosis not present

## 2023-01-21 DIAGNOSIS — I10 Essential (primary) hypertension: Secondary | ICD-10-CM | POA: Diagnosis not present

## 2023-01-21 DIAGNOSIS — C549 Malignant neoplasm of corpus uteri, unspecified: Secondary | ICD-10-CM | POA: Diagnosis not present

## 2023-01-21 DIAGNOSIS — C541 Malignant neoplasm of endometrium: Secondary | ICD-10-CM

## 2023-01-21 DIAGNOSIS — Z7984 Long term (current) use of oral hypoglycemic drugs: Secondary | ICD-10-CM | POA: Diagnosis not present

## 2023-01-21 MED ORDER — SENNOSIDES-DOCUSATE SODIUM 8.6-50 MG PO TABS: 2.0000 | ORAL_TABLET | Freq: Every day | ORAL | 0 refills | Status: DC

## 2023-01-21 NOTE — Patient Instructions (Addendum)
You will need to have a CT scan before surgery. NOTHING TO EAT OR DRINK 4 HOURS BEFORE YOUR SCAN. You will need to arrive 2 hours before your scan time to begin drinking oral contrast.   Preparing for your Surgery  Plan for surgery on February 03, 2023 with Dr. Eugene Garnet at [facility]. You will be scheduled for diagnostic laparoscopy (looking into the abdomen with a camera through a small incision), robotic assisted laparoscopic vs open total hysterectomy, bilateral salpingo-oophorectomy, possible lymph node dissection, possible omentectomy, and other indicated procedures.  We will plan for at least an overnight stay in the hospital up to several days if surgery performed open.    Pre-operative Testing -You will receive a phone call from presurgical testing at [facility] to arrange for a pre-operative appointment and lab work.  -Bring your insurance card, copy of an advanced directive if applicable, medication list  -At that visit, you will be asked to sign a consent for a possible blood transfusion in case a transfusion becomes necessary during surgery.  The need for a blood transfusion is rare but having consent is a necessary part of your care.     -You should not be taking blood thinners or aspirin at least ten days prior to surgery unless instructed by your surgeon.  -Do not take supplements such as fish oil (omega 3), red yeast rice, turmeric before your surgery. STOP TAKING AT LEAST 10 DAYS BEFORE SURGERY. You want to avoid medications with aspirin in them including headache powders such as BC or Goody's), Excedrin migraine.  Day Before Surgery at Home -You will be asked to take in a light diet the day before surgery. You will be advised you can have clear liquids up until 3 hours before your surgery.    Eat a light diet the day before surgery.  Examples including soups, broths, toast, yogurt, mashed potatoes.  AVOID GAS PRODUCING FOODS AND BEVERAGES. Things to  avoid include carbonated beverages (fizzy beverages, sodas), raw fruits and raw vegetables (uncooked), or beans.   If your bowels are filled with gas, your surgeon will have difficulty visualizing your pelvic organs which increases your surgical risks.  Your role in recovery Your role is to become active as soon as directed by your doctor, while still giving yourself time to heal.  Rest when you feel tired. You will be asked to do the following in order to speed your recovery:  - Cough and breathe deeply. This helps to clear and expand your lungs and can prevent pneumonia after surgery.  - STAY ACTIVE WHEN YOU GET HOME. Do mild physical activity. Walking or moving your legs help your circulation and body functions return to normal. Do not try to get up or walk alone the first time after surgery.   -If you develop swelling on one leg or the other, pain in the back of your leg, redness/warmth in one of your legs, please call the office or go to the Emergency Room to have a doppler to rule out a blood clot. For shortness of breath, chest pain-seek care in the Emergency Room as soon as possible. - Actively manage your pain. Managing your pain lets you move in comfort. We will ask you to rate your pain on a scale of zero to 10. It is your responsibility to tell your doctor or nurse where and how much you hurt so your pain can be treated.  Special Considerations -If you are diabetic, you may be  placed on insulin after surgery to have closer control over your blood sugars to promote healing and recovery.  This does not mean that you will be discharged on insulin.  If applicable, your oral antidiabetics will be resumed when you are tolerating a solid diet.  -Your final pathology results from surgery should be available around one week after surgery and the results will be relayed to you when available.  -FMLA forms can be faxed to [phone] and please allow 5-7 business days for completion.  Pain  Management After Surgery -Make sure that you have Tylenol IF YOU ARE ABLE TO TAKE THESE MEDICATION at home to use on a regular basis after surgery for pain control.   -Review the attached handout on narcotic use and their risks and side effects.   Bowel Regimen -You will be prescribed Sennakot-S to take nightly to prevent constipation especially if you are taking the narcotic pain medication intermittently.  It is important to prevent constipation and drink adequate amounts of liquids. You can stop taking this medication when you are not taking pain medication and you are back on your normal bowel routine.  Risks of Surgery Risks of surgery are low but include bleeding, infection, damage to surrounding structures, re-operation, blood clots, and very rarely death.   Blood Transfusion Information (For the consent to be signed before surgery)  We will be checking your blood type before surgery so in case of emergencies, we will know what type of blood you would need.                                            WHAT IS A BLOOD TRANSFUSION?  A transfusion is the replacement of blood or some of its parts. Blood is made up of multiple cells which provide different functions. Red blood cells carry oxygen and are used for blood loss replacement. White blood cells fight against infection. Platelets control bleeding. Plasma helps clot blood. Other blood products are available for specialized needs, such as hemophilia or other clotting disorders. BEFORE THE TRANSFUSION  Who gives blood for transfusions?  You may be able to donate blood to be used at a later date on yourself (autologous donation). Relatives can be asked to donate blood. This is generally not any safer than if you have received blood from a stranger. The same precautions are taken to ensure safety when a relative's blood is donated. Healthy volunteers who are fully evaluated to make sure their blood is safe. This is blood bank  blood. Transfusion therapy is the safest it has ever been in the practice of medicine. Before blood is taken from a donor, a complete history is taken to make sure that person has no history of diseases nor engages in risky social behavior (examples are intravenous drug use or sexual activity with multiple partners). The donor's travel history is screened to minimize risk of transmitting infections, such as malaria. The donated blood is tested for signs of infectious diseases, such as HIV and hepatitis. The blood is then tested to be sure it is compatible with you in order to minimize the chance of a transfusion reaction. If you or a relative donates blood, this is often done in anticipation of surgery and is not appropriate for emergency situations. It takes many days to process the donated blood. RISKS AND COMPLICATIONS Although transfusion therapy is very safe and saves  many lives, the main dangers of transfusion include:  Getting an infectious disease. Developing a transfusion reaction. This is an allergic reaction to something in the blood you were given. Every precaution is taken to prevent this. The decision to have a blood transfusion has been considered carefully by your caregiver before blood is given. Blood is not given unless the benefits outweigh the risks.  AFTER SURGERY INSTRUCTIONS  Return to work: 4-6 weeks if applicable  You will need to be on a blood thinner after surgery to prevent blood clots for 2 weeks versus 4 weeks based on the surgery being through small incisions or a large incision. This can be given in pill form or injections.   AVOID USE OF NSAIDS (IBUPROFEN, NAPROXEN) WHILE TAKING THE BLOOD THINNER.   You may have a white honeycomb dressing over your larger incision if surgery is done open. This dressing can be removed 5 days after surgery and you do not need to reapply a new dressing. Once you remove the dressing, you will notice that you have the surgical glue  (dermabond) on the incision and this will peel off on its own. You can get this dressing wet in the shower the days after surgery prior to removal on the 5th day.   Activity: 1. Be up and out of the bed during the day.  Take a nap if needed.  You may walk up steps but be careful and use the hand rail.  Stair climbing will tire you more than you think, you may need to stop part way and rest.   2. No lifting or straining for 6 weeks over 10 pounds. No pushing, pulling, straining for 6 weeks.  3. No driving for 5-78 days when the following criteria have been met: Do not drive if you are taking narcotic pain medicine and make sure that your reaction time has returned.   4. You can shower as soon as the next day after surgery. Shower daily.  Use your regular soap and water (not directly on the incision) and pat your incision(s) dry afterwards; don't rub.  No tub baths or submerging your body in water until cleared by your surgeon. If you have the soap that was given to you by pre-surgical testing that was used before surgery, you do not need to use it afterwards because this can irritate your incisions.   5. No sexual activity and nothing in the vagina for 10-12 weeks.  6. You may experience a small amount of clear drainage from your incisions, which is normal.  If the drainage persists, increases, or changes color please call the office.  7. Do not use creams, lotions, or ointments such as neosporin on your incisions after surgery until advised by your surgeon because they can cause removal of the dermabond glue on your incisions.    8. You may experience vaginal spotting after surgery or when the stitches at the top of the vagina begin to dissolve.  The spotting is normal but if you experience heavy bleeding, call our office.  9. Take Tylenol first for pain if you are able to take these medication and only use narcotic pain medication for severe pain not relieved by the Tylenol.  Monitor your Tylenol  intake to a max of 4,000 mg in a 24 hour period.   Diet: 1. Low sodium Heart Healthy Diet is recommended but you are cleared to resume your normal (before surgery) diet after your procedure.  2. It is safe to use  a laxative, such as Miralax or Colace, if you have difficulty moving your bowels before surgery. You have been prescribed Sennakot-S to take at bedtime every evening after surgery to keep bowel movements regular and to prevent constipation.    Wound Care: 1. Keep clean and dry.  Shower daily.  Reasons to call the Doctor: Fever - Oral temperature greater than 100.4 degrees Fahrenheit Foul-smelling vaginal discharge Difficulty urinating Nausea and vomiting Increased pain at the site of the incision that is unrelieved with pain medicine. Difficulty breathing with or without chest pain New calf pain especially if only on one side Sudden, continuing increased vaginal bleeding with or without clots.   Contacts: For questions or concerns you should contact:  Dr. Eugene Garnet at [phone]  Warner Mccreedy, NP at [phone]  After Hours: call [phone] and have the GYN Oncologist paged/contacted (after 5 pm or on the weekends). You will speak with an after hours RN and let he or she know you have had surgery.  Messages sent via mychart are for non-urgent matters and are not responded to after hours so for urgent needs, please call the after hours number.

## 2023-01-21 NOTE — Progress Notes (Signed)
Patient here for a pre-operative appointment prior to her scheduled surgery on 02/03/2023. She is scheduled for a diagnostic laparoscopy, robotic assisted laparoscopic vs open total hysterectomy, bilateral salpingo-oophorectomy, possible lymph node dissection, possible omentectomy, and other indicated procedures. The surgery was discussed in detail.  See after visit summary for additional details.     Discussed post-op pain management in detail including the aspects of the enhanced recovery pathway.  We discussed the use of tylenol post-op and to monitor for a maximum of 4,000 mg in a 24 hour period.  Also prescribed sennakot to be used after surgery and to hold if having loose stools.  Discussed bowel regimen in detail.     Discussed the use of SCDs and measures to take at home to prevent DVT including frequent mobility.  Reportable signs and symptoms of DVT discussed. Post-operative instructions discussed and expectations for after surgery. Incisional care discussed as well including reportable signs and symptoms including erythema, drainage, wound separation.     30 minutes spent with the patient.  Verbalizing understanding of material discussed. No needs or concerns voiced at the end of the visit.   Advised patient to call for any needs.  Advised that her post-operative medications had been prescribed and could be picked up at any time.    This appointment is included in the global surgical bundle as pre-operative teaching and has no charge.

## 2023-01-24 ENCOUNTER — Other Ambulatory Visit: Payer: Self-pay

## 2023-01-24 ENCOUNTER — Telehealth: Payer: Self-pay

## 2023-01-24 ENCOUNTER — Encounter (HOSPITAL_COMMUNITY)
Admission: RE | Admit: 2023-01-24 | Discharge: 2023-01-24 | Disposition: A | Discharge status: Home / Self Care | Payer: BC Managed Care – PPO | Source: Ambulatory Visit | Attending: Gynecologic Oncology | Admitting: Gynecologic Oncology

## 2023-01-24 ENCOUNTER — Encounter (HOSPITAL_COMMUNITY): Payer: Self-pay

## 2023-01-24 ENCOUNTER — Telehealth: Payer: Self-pay | Admitting: *Deleted

## 2023-01-24 VITALS — BP 132/82 | HR 99 | Temp 97.9°F | Resp 14 | Ht 66.0 in | Wt 187.0 lb

## 2023-01-24 DIAGNOSIS — Z01818 Encounter for other preprocedural examination: Secondary | ICD-10-CM | POA: Insufficient documentation

## 2023-01-24 DIAGNOSIS — R9431 Abnormal electrocardiogram [ECG] [EKG]: Secondary | ICD-10-CM | POA: Diagnosis not present

## 2023-01-24 DIAGNOSIS — E119 Type 2 diabetes mellitus without complications: Secondary | ICD-10-CM | POA: Diagnosis not present

## 2023-01-24 DIAGNOSIS — C541 Malignant neoplasm of endometrium: Secondary | ICD-10-CM | POA: Diagnosis not present

## 2023-01-24 HISTORY — DX: Unspecified osteoarthritis, unspecified site: M19.90

## 2023-01-24 HISTORY — DX: Sleep apnea, unspecified: G47.30

## 2023-01-24 LAB — COMPREHENSIVE METABOLIC PANEL
ALT: 9 U/L (ref 0–44)
AST: 15 U/L (ref 15–41)
Albumin: 3 g/dL — ABNORMAL LOW (ref 3.5–5.0)
Alkaline Phosphatase: 46 U/L (ref 38–126)
Anion gap: 12 (ref 5–15)
BUN: 10 mg/dL (ref 8–23)
CO2: 24 mmol/L (ref 22–32)
Calcium: 8.8 mg/dL — ABNORMAL LOW (ref 8.9–10.3)
Chloride: 100 mmol/L (ref 98–111)
Creatinine, Ser: 1.07 mg/dL — ABNORMAL HIGH (ref 0.44–1.00)
GFR, Estimated: 59 mL/min — ABNORMAL LOW (ref >60)
Glucose, Bld: 178 mg/dL — ABNORMAL HIGH (ref 70–99)
Potassium: 3.4 mmol/L — ABNORMAL LOW (ref 3.5–5.1)
Sodium: 136 mmol/L (ref 135–145)
Total Bilirubin: 0.3 mg/dL (ref <1.2)
Total Protein: 7.6 g/dL (ref 6.5–8.1)

## 2023-01-24 LAB — CBC
HCT: 29.6 % — ABNORMAL LOW (ref 36.0–46.0)
Hemoglobin: 9.1 g/dL — ABNORMAL LOW (ref 12.0–15.0)
MCH: 24.2 pg — ABNORMAL LOW (ref 26.0–34.0)
MCHC: 30.7 g/dL (ref 30.0–36.0)
MCV: 78.7 fL — ABNORMAL LOW (ref 80.0–100.0)
Platelets: 480 10*3/uL — ABNORMAL HIGH (ref 150–400)
RBC: 3.76 MIL/uL — ABNORMAL LOW (ref 3.87–5.11)
RDW: 13.6 % (ref 11.5–15.5)
WBC: 4.8 10*3/uL (ref 4.0–10.5)
nRBC: 0 % (ref 0.0–0.2)

## 2023-01-24 LAB — GLUCOSE, CAPILLARY: Glucose-Capillary: 140 mg/dL — ABNORMAL HIGH (ref 70–99)

## 2023-01-24 NOTE — Progress Notes (Signed)
Please place orders for PAT appointment.  

## 2023-01-24 NOTE — Progress Notes (Addendum)
COVID Vaccine Completed: yes  Date of COVID positive in last 90 days:  PCP - Mirna Mires, MD Cardiologist - [place], [place] LOV 05/07/21  Chest x-ray - 01/14/23 Epic EKG - 01/23/22 Epic/chart Stress Test - 04/22/20 Epic ECHO - 03/24/20 Epic Cardiac Cath - n/a Pacemaker/ICD device last checked: n/a Spinal Cord Stimulator: n/a  Bowel Prep - no  Sleep Study - yes CPAP - no  Fasting Blood Sugar - 98-130 Checks Blood Sugar  every other day  Last dose of GLP1 agonist-  Rybelsus GLP1 instructions:  Hold 1 days before surgery    Last dose of SGLT-2 inhibitors-  N/A SGLT-2 instructions:  Hold 3 days before surgery    Blood Thinner Instructions:  n/a Aspirin Instructions: Last Dose:  Activity level: Can go up a flight of stairs and perform activities of daily living without stopping and without symptoms of chest pain or shortness of breath.  Anesthesia review: Mobitz type 2 AV block, HTN, DM2, Hgb 9.1  Patient denies shortness of breath, fever, cough and chest pain at PAT appointment  Patient verbalized understanding of instructions that were given to them at the PAT appointment. Patient was also instructed that they will need to review over the PAT instructions again at home before surgery.

## 2023-01-24 NOTE — Patient Instructions (Addendum)
SURGICAL WAITING ROOM VISITATION  Patients having surgery or a procedure may have no more than 2 support people in the waiting area - these visitors may rotate.    Children under the age of 19 must have an adult with them who is not the patient.  Due to an increase in RSV and influenza rates and associated hospitalizations, children ages 13 and under may not visit patients in [facility] hospitals.  If the patient needs to stay at the hospital during part of their recovery, the visitor guidelines for inpatient rooms apply. Pre-op nurse will coordinate an appropriate time for 1 support person to accompany patient in pre-op.  This support person may not rotate.    Please refer to the [facility] website for the visitor guidelines for Inpatients (after your surgery is over and you are in a regular room).    Your procedure is scheduled on: 02/03/23   Report to [facility] Main Entrance    Report to admitting at 10:00 AM   Call this number if you have problems the morning of surgery [phone]   Do not eat food :After Midnight.   After Midnight you may have the following liquids until 9:15 AM DAY OF SURGERY  Water Non-Citrus Juices (without pulp, NO RED-Apple, White grape, White cranberry) Black Coffee (NO MILK/CREAM OR CREAMERS, sugar ok)  Clear Tea (NO MILK/CREAM OR CREAMERS, sugar ok) regular and decaf                             Plain Jell-O (NO RED)                                           Fruit ices (not with fruit pulp, NO RED)                                     Popsicles (NO RED)                                                               Sports drinks like Gatorade (NO RED)          If you have questions, please contact your surgeon's office.   FOLLOW BOWEL PREP AND ANY ADDITIONAL PRE OP INSTRUCTIONS YOU RECEIVED FROM YOUR SURGEON'S OFFICE!!!     Oral Hygiene is also important to reduce your risk of infection.                                    Remember -  BRUSH YOUR TEETH THE MORNING OF SURGERY WITH YOUR REGULAR TOOTHPASTE  DENTURES WILL BE REMOVED PRIOR TO SURGERY PLEASE DO NOT APPLY "Poly grip" OR ADHESIVES!!!   Stop all vitamins and herbal supplements 7 days before surgery.   Take these medicines the morning of surgery with A SIP OF WATER: Oxycodone   How to Manage Your Diabetes Before and After Surgery  Why is it important to control my blood sugar before and after surgery? Improving  blood sugar levels before and after surgery helps healing and can limit problems. A way of improving blood sugar control is eating a healthy diet by:  Eating less sugar and carbohydrates  Increasing activity/exercise  Talking with your doctor about reaching your blood sugar goals High blood sugars (greater than 180 mg/dL) can raise your risk of infections and slow your recovery, so you will need to focus on controlling your diabetes during the weeks before surgery. Make sure that the doctor who takes care of your diabetes knows about your planned surgery including the date and location.  How do I manage my blood sugar before surgery? Check your blood sugar at least 4 times a day, starting 2 days before surgery, to make sure that the level is not too high or low. Check your blood sugar the morning of your surgery when you wake up and every 2 hours until you get to the Short Stay unit. If your blood sugar is less than 70 mg/dL, you will need to treat for low blood sugar: Do not take insulin. Treat a low blood sugar (less than 70 mg/dL) with  cup of clear juice (cranberry or apple), 4 glucose tablets, OR glucose gel. Recheck blood sugar in 15 minutes after treatment (to make sure it is greater than 70 mg/dL). If your blood sugar is not greater than 70 mg/dL on recheck, call [phone] for further instructions. Report your blood sugar to the short stay nurse when you get to Short Stay.  If you are admitted to the hospital after surgery: Your blood sugar  will be checked by the staff and you will probably be given insulin after surgery (instead of oral diabetes medicines) to make sure you have good blood sugar levels. The goal for blood sugar control after surgery is 80-180 mg/dL.   WHAT DO I DO ABOUT MY DIABETES MEDICATION?  Do not take oral diabetes medicines (pills) the morning of surgery.  THE DAY BEFORE SURGERY, take Metformin as prescribed. Do not take Rybelsus       THE MORNING OF SURGERY, do not take Metformin or Rybelsus  Reviewed and Endorsed by [facility] Patient Education Committee, August 2015                              You may not have any metal on your body including hair pins, jewelry, and body piercing             Do not wear make-up, lotions, powders, perfumes, or deodorant  Do not wear nail polish including gel and S&S, artificial/acrylic nails, or any other type of covering on natural nails including finger and toenails. If you have artificial nails, gel coating, etc. that needs to be removed by a nail salon please have this removed prior to surgery or surgery may need to be canceled/ delayed if the surgeon/ anesthesia feels like they are unable to be safely monitored.   Do not shave  48 hours prior to surgery.    Do not bring valuables to the hospital.  IS NOT             RESPONSIBLE   FOR VALUABLES.   Contacts, glasses, dentures or bridgework may not be worn into surgery.  DO NOT BRING YOUR HOME MEDICATIONS TO THE HOSPITAL. PHARMACY WILL DISPENSE MEDICATIONS LISTED ON YOUR MEDICATION LIST TO YOU DURING YOUR ADMISSION IN THE HOSPITAL!    Patients discharged on the day of  surgery will not be allowed to drive home.  Someone NEEDS to stay with you for the first 24 hours after anesthesia.              Please read over the following fact sheets you were given: IF YOU HAVE QUESTIONS ABOUT YOUR PRE-OP INSTRUCTIONS PLEASE CALL [phone] [place]ras   If you received a COVID test during your pre-op visit  it  is requested that you wear a mask when out in public, stay away from anyone that may not be feeling well and notify your surgeon if you develop symptoms. If you test positive for Covid or have been in contact with anyone that has tested positive in the last 10 days please notify you surgeon.    [place] - Preparing for Surgery Before surgery, you can play an important role.  Because skin is not sterile, your skin needs to be as free of germs as possible.  You can reduce the number of germs on your skin by washing with CHG (chlorahexidine gluconate) soap before surgery.  CHG is an antiseptic cleaner which kills germs and bonds with the skin to continue killing germs even after washing. Please DO NOT use if you have an allergy to CHG or antibacterial soaps.  If your skin becomes reddened/irritated stop using the CHG and inform your nurse when you arrive at Short Stay. Do not shave (including legs and underarms) for at least 48 hours prior to the first CHG shower.  You may shave your face/neck.  Please follow these instructions carefully:  1.  Shower with CHG Soap the night before surgery and the  morning of surgery.  2.  If you choose to wash your hair, wash your hair first as usual with your normal  shampoo.  3.  After you shampoo, rinse your hair and body thoroughly to remove the shampoo.                             4.  Use CHG as you would any other liquid soap.  You can apply chg directly to the skin and wash.  Gently with a scrungie or clean washcloth.  5.  Apply the CHG Soap to your body ONLY FROM THE NECK DOWN.   Do   not use on face/ open                           Wound or open sores. Avoid contact with eyes, ears mouth and   genitals (private parts).                       Wash face,  Genitals (private parts) with your normal soap.             6.  Wash thoroughly, paying special attention to the area where your    surgery  will be performed.  7.  Thoroughly rinse your body with warm water  from the neck down.  8.  DO NOT shower/wash with your normal soap after using and rinsing off the CHG Soap.                9.  Pat yourself dry with a clean towel.            10.  Wear clean pajamas.            11.  Place clean sheets on your  bed the night of your first shower and do not  sleep with pets. Day of Surgery : Do not apply any lotions/deodorants the morning of surgery.  Please wear clean clothes to the hospital/surgery center.  FAILURE TO FOLLOW THESE INSTRUCTIONS MAY RESULT IN THE CANCELLATION OF YOUR SURGERY  PATIENT SIGNATURE_________________________________  NURSE SIGNATURE__________________________________  ________________________________________________________________________  WHAT IS A BLOOD TRANSFUSION? Blood Transfusion Information  A transfusion is the replacement of blood or some of its parts. Blood is made up of multiple cells which provide different functions. Red blood cells carry oxygen and are used for blood loss replacement. White blood cells fight against infection. Platelets control bleeding. Plasma helps clot blood. Other blood products are available for specialized needs, such as hemophilia or other clotting disorders. BEFORE THE TRANSFUSION  Who gives blood for transfusions?  Healthy volunteers who are fully evaluated to make sure their blood is safe. This is blood bank blood. Transfusion therapy is the safest it has ever been in the practice of medicine. Before blood is taken from a donor, a complete history is taken to make sure that person has no history of diseases nor engages in risky social behavior (examples are intravenous drug use or sexual activity with multiple partners). The donor's travel history is screened to minimize risk of transmitting infections, such as malaria. The donated blood is tested for signs of infectious diseases, such as HIV and hepatitis. The blood is then tested to be sure it is compatible with you in order to minimize the  chance of a transfusion reaction. If you or a relative donates blood, this is often done in anticipation of surgery and is not appropriate for emergency situations. It takes many days to process the donated blood. RISKS AND COMPLICATIONS Although transfusion therapy is very safe and saves many lives, the main dangers of transfusion include:  Getting an infectious disease. Developing a transfusion reaction. This is an allergic reaction to something in the blood you were given. Every precaution is taken to prevent this. The decision to have a blood transfusion has been considered carefully by your caregiver before blood is given. Blood is not given unless the benefits outweigh the risks. AFTER THE TRANSFUSION Right after receiving a blood transfusion, you will usually feel much better and more energetic. This is especially true if your red blood cells have gotten low (anemic). The transfusion raises the level of the red blood cells which carry oxygen, and this usually causes an energy increase. The nurse administering the transfusion will monitor you carefully for complications. HOME CARE INSTRUCTIONS  No special instructions are needed after a transfusion. You may find your energy is better. Speak with your caregiver about any limitations on activity for underlying diseases you may have. SEEK MEDICAL CARE IF:  Your condition is not improving after your transfusion. You develop redness or irritation at the intravenous (IV) site. SEEK IMMEDIATE MEDICAL CARE IF:  Any of the following symptoms occur over the next 12 hours: Shaking chills. You have a temperature by mouth above 102 F (38.9 C), not controlled by medicine. Chest, back, or muscle pain. People around you feel you are not acting correctly or are confused. Shortness of breath or difficulty breathing. Dizziness and fainting. You get a rash or develop hives. You have a decrease in urine output. Your urine turns a dark color or changes to  pink, red, or brown. Any of the following symptoms occur over the next 10 days: You have a temperature by mouth above 102 F (  38.9 C), not controlled by medicine. Shortness of breath. Weakness after normal activity. The white part of the eye turns yellow (jaundice). You have a decrease in the amount of urine or are urinating less often. Your urine turns a dark color or changes to pink, red, or brown. Document Released: 01/16/2000 Document Revised: 04/12/2011 Document Reviewed: 09/04/2007 [facility] Patient Information 2014 [place], [place].  _______________________________________________________________________

## 2023-01-24 NOTE — Telephone Encounter (Signed)
Per Dr Pricilla Holm fax surgical optimization form to PCP (dr Loleta Chance [phone])

## 2023-01-24 NOTE — Telephone Encounter (Signed)
-----   Message from Doylene Bode sent at 01/24/2023  3:48 PM EST ----- Please let the patient know her potassium is slightly low. She can increase her intake of potassium rich foods but not in excess. Her glucose was elevated on labs so would monitor intake and limit sugary, high carb foods.   Albumin which is a marker of nutrition is slightly low. Would suggest trying to get in a protein shake once or twice a day esp if not eating full balanced meals. Would do low sugar shakes like premier or glucerna.

## 2023-01-24 NOTE — Telephone Encounter (Signed)
FMLA forms from [employer] received and faxed back for pt's upcoming surgery with Dr.Tucker on 02/03/23

## 2023-01-24 NOTE — Telephone Encounter (Signed)
Spoke with Ms. Debord and relayed message from Warner Mccreedy, NP that patient's potassium is slightly low. Advised patient that she can increase her intake of potassium rich foods but not in excess. Good examples are leafy greens, rooted veggies like carrots and potatoes, beans and peas. Pt's glucose was elevated on labs so would monitor intake and limit sugary, high carb foods.   Albumin which is a marker of nutrition is slightly low. Also advised patient to try getting in a protein shake once or twice a day especially if not eating full balanced meals. Low sugar shake choices like premier or glucerna. Pt verbalized understanding and instructed to call the office with any questions or concerns.

## 2023-01-27 ENCOUNTER — Ambulatory Visit (HOSPITAL_COMMUNITY)
Admission: RE | Admit: 2023-01-27 | Discharge: 2023-01-27 | Disposition: A | Discharge status: Home / Self Care | Payer: BC Managed Care – PPO | Source: Ambulatory Visit | Attending: Gynecologic Oncology | Admitting: Gynecologic Oncology

## 2023-01-27 ENCOUNTER — Telehealth: Payer: Self-pay | Admitting: *Deleted

## 2023-01-27 DIAGNOSIS — C541 Malignant neoplasm of endometrium: Secondary | ICD-10-CM | POA: Insufficient documentation

## 2023-01-27 DIAGNOSIS — C499 Malignant neoplasm of connective and soft tissue, unspecified: Secondary | ICD-10-CM | POA: Diagnosis present

## 2023-01-27 MED ORDER — IOHEXOL 300 MG/ML  SOLN
100.0000 mL | Freq: Once | INTRAMUSCULAR | Status: AC | PRN
  Administered 2023-01-27: 100 mL via INTRAVENOUS

## 2023-01-27 MED ORDER — IOHEXOL 300 MG/ML  SOLN
30.0000 mL | Freq: Once | INTRAMUSCULAR | Status: AC | PRN
  Administered 2023-01-27: 30 mL via ORAL

## 2023-01-27 NOTE — Telephone Encounter (Signed)
Spoke with Ms. Tribby who called the office to ask if there is anything she could take to help increase her appetite. Pt states she is only able to take a few bites of food and unable to finish. Pt states, "food has no appeal"  Pt denies nausea and vomiting states she tried protein shakes but they gave her diarrhea. Pt denies fever, chills & pain. Pt is scheduled for surgery on 1/2.  Advised patient to try and eat 6-8 smaller meals throughout the day, and try introducing milk shakes as well. Pt states she really likes milkshakes. Also advised her message would be relayed to provider and the office would call back with recommendations.

## 2023-01-27 NOTE — Telephone Encounter (Signed)
Spoke with patient and relayed message from Dr. Pricilla Holm that the hope is your appetite will improve after surgery. Continue eating smaller meals throughout the day, high calorie milk shakes with added fruits and veggies and powdered protein that you can make yourself since the protein shakes caused diarrhea. Also adding nut butters, like peanut butter, almond butter ect.Marland Kitchen as a good protein snack. Pt verbalized understanding and thanked the office for calling back.

## 2023-01-27 NOTE — Telephone Encounter (Signed)
Called PCP and LMOM to call the office back regarding clearance

## 2023-01-28 ENCOUNTER — Ambulatory Visit: Payer: BC Managed Care – PPO | Attending: Cardiology | Admitting: Cardiology

## 2023-01-28 VITALS — BP 100/70 | HR 97 | Resp 16 | Ht 66.0 in | Wt 187.0 lb

## 2023-01-28 DIAGNOSIS — Z01818 Encounter for other preprocedural examination: Secondary | ICD-10-CM

## 2023-01-28 DIAGNOSIS — E7800 Pure hypercholesterolemia, unspecified: Secondary | ICD-10-CM

## 2023-01-28 DIAGNOSIS — E119 Type 2 diabetes mellitus without complications: Secondary | ICD-10-CM

## 2023-01-28 DIAGNOSIS — Z01810 Encounter for preprocedural cardiovascular examination: Secondary | ICD-10-CM | POA: Diagnosis not present

## 2023-01-28 DIAGNOSIS — I10 Essential (primary) hypertension: Secondary | ICD-10-CM

## 2023-01-28 MED ORDER — EZETIMIBE 10 MG PO TABS: 10.0000 mg | ORAL_TABLET | Freq: Every day | ORAL | 0 refills | Status: DC

## 2023-01-28 NOTE — Patient Instructions (Signed)
Testing/Procedures: **Cleared for upcoming procedure/surgery** **Please follow-up w/PCP for lipids and medication management**  Follow-Up: At [facility], you and your health needs are our priority.  As part of our continuing mission to provide you with exceptional heart care, we have created designated Provider Care Teams.  These Care Teams include your primary Cardiologist (physician) and Advanced Practice Providers (APPs -  Physician Assistants and Nurse Practitioners) who all work together to provide you with the care you need, when you need it.  Your next appointment:   As Needed  Provider:   Yates Decamp, MD

## 2023-01-28 NOTE — Telephone Encounter (Signed)
Received clearance

## 2023-01-28 NOTE — Progress Notes (Unsigned)
Cardiology Office Note:  .   Date:  01/29/2023  ID:  Danielle Mullins, DOB 12-08-1960, MRN [id number] PCP: Danielle Lager, FNP  [facility] Providers Cardiologist:  Danielle Decamp, MD   History of Present Illness: .   Danielle Mullins is a 62 y.o. African-American female with hypertension, hyperlipidemia, diabetes mellitus, obstructive sleep apnea presently not using CPAP, morbid obesity with BMI of 41 in 2018 and has lost about 30 to 40 pounds over the past 3 years. 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   Discussed the use of AI scribe software for clinical note transcription with the patient, who gave verbal consent to proceed.  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 The patient has been experiencing abdominal pain, right hip pain, and loss of appetite. The patient's mobility is limited due to weakness and pain from the tumor pressing on nerves. The patient's diabetes is well controlled with metformin and Rybelsus, and her cholesterol is managed with simvastatin. The patient has not been very active recently due to her symptoms.      Review of Systems  Cardiovascular:  Negative for chest pain, dyspnea on exertion and leg swelling.    Labs   Lab Results  Component Value Date   NA 136 01/24/2023   K 3.4 (L) 01/24/2023   CO2 24 01/24/2023   GLUCOSE 178 (H) 01/24/2023   BUN 10 01/24/2023   CREATININE 1.07 (H) 01/24/2023   CALCIUM 8.8 (L) 01/24/2023   GFRNONAA 59 (L) 01/24/2023      Latest Ref Rng & Units 01/24/2023    2:31 PM 01/13/2023    2:31 AM 01/12/2023    4:28 AM  BMP  Glucose 70 - 99 mg/dL 578  469  629   BUN 8 - 23 mg/dL 10  11  26    Creatinine 0.44 - 1.00  mg/dL 5.28  4.13  2.44   Sodium 135 - 145 mmol/L 136  136  134   Potassium 3.5 - 5.1 mmol/L 3.4  3.6  3.5   Chloride 98 - 111 mmol/L 100  106  105   CO2 22 - 32 mmol/L 24  22  24    Calcium 8.9 - 10.3 mg/dL 8.8  8.2  8.2       Latest Ref Rng & Units 01/24/2023    2:31 PM 01/14/2023    2:38 AM 01/13/2023    2:31 AM  CBC  WBC 4.0 - 10.5 K/uL 4.8  8.4  9.5   Hemoglobin 12.0 - 15.0 g/dL 9.1  8.1  7.7   Hematocrit 36.0 - 46.0 % 29.6  24.9  24.1   Platelets 150 - 400 K/uL 480  511  498    Lab Results  Component Value Date   HGBA1C 6.3 (H) 01/12/2023    External Labs:  Labs 12/09/2022:  Total cholesterol 202, triglycerides 93, HDL 44, LDL 139.  Physical Exam:   VS:  BP 100/70 (BP Location: Left Arm, Patient Position: Sitting, Cuff Size: Normal)   Pulse 97   Resp 16   Ht 5\' 6"  (1.676 m)   Wt 187 lb (84.8 kg)   SpO2 97%   BMI 30.18 kg/m  Wt Readings from Last 3 Encounters:  01/28/23 187 lb (84.8 kg)  01/24/23 187 lb (84.8 kg)  01/21/23 191 lb (86.6 kg)     Physical Exam Neck:     Vascular: No carotid bruit or JVD.  Cardiovascular:     Rate and Rhythm: Normal rate and regular rhythm.     Pulses: Intact distal pulses.     Heart sounds: Normal heart sounds. No murmur heard.    No gallop.  Pulmonary:     Effort: Pulmonary effort is normal.     Breath sounds: Normal breath sounds.  Abdominal:     General: Bowel sounds are normal.     Palpations: Abdomen is soft.  Musculoskeletal:     Right lower leg: No edema.     Left lower leg: No edema.    Studies Reviewed: Marland Kitchen    Exercise tetrofosmin stress test  04/21/2020: Normal ECG stress. The patient exercised for 5 minutes and 0 seconds of a Bruce protocol, achieving approximately 7.02 METs. Baseline heart rate was 70 bpm. A maximum heart rate of 150 beats per minute was achieved, which is 94% of the maximum predicted heart rate response.  No heart block and stress EKG negative for ischemia. No chest pain. Normal BP  response.  There is a small sized mild reversible defect in the inferior region. noted superimposed on breast tissue attenuation. Overall LV systolic function is normal without regional wall motion abnormalities. Stress LV EF: 60-65%.  No previous exam available for comparison. Low risk study.    Echocardiogram 03/24/2020:   Patient appears to be in Mobitz II AV block. Left ventricular ejection fraction, by estimation, is 60 to 65%. The left ventricle has normal function. The left ventricle has no regional wall motion abnormalities. Indeterminate diastolic filling due to E-A fusion.Right ventricular systolic function is normal.  The right ventricular size is normal. The mitral valve is normal in structure. No evidence of mitral valve regurgitation. No evidence of mitral stenosis. The aortic valve is normal in structure. Aortic valve regurgitation is not visualized. No aortic stenosis is present. The inferior vena cava is normal in size with <50% respiratory variability, suggesting right atrial pressure of 8 mmHg. No significant change from 09/16/2015.  EKG:    EKG Interpretation Date/Time:  Friday January 28 2023 15:01:04 EST Ventricular Rate:  97 PR Interval:  192 QRS Duration:  86 QT Interval:  324 QTC Calculation: 411 R Axis:   11  Text Interpretation: EKG 01/28/2023: Normal sinus rhythm at rate of 97 bpm, normal axis, LVH.  Nonspecific T abnormality.  No significant change from 01/24/2023.  Compared to 03/24/2020, Mobitz 2 second-degree AV block not present. Confirmed by Delrae Rend [id number]) on 01/28/2023 3:36:55 PM    Medications and allergies    Allergies  Allergen Reactions   Celecoxib Swelling     Current Outpatient Medications:    ezetimibe (ZETIA) 10 MG tablet, Take 1 tablet (10 mg total) by mouth daily., Disp: 90 tablet, Rfl: 0   ferrous sulfate 325 (65 FE) MG tablet, Take 1 tablet (325 mg total) by mouth daily with breakfast., Disp: 60 tablet, Rfl: 0   gabapentin  (NEURONTIN) 100 MG capsule, Take 100 mg by mouth at bedtime as needed (nerve pain)., Disp: , Rfl:    metFORMIN (GLUCOPHAGE) 500 MG tablet, Take 500 mg by mouth 2 (two) times daily with a meal., Disp: , Rfl:    metroNIDAZOLE (FLAGYL) 500 MG tablet, Take 1 tablet (500 mg total) by mouth every  12 (twelve) hours., Disp: 60 tablet, Rfl: 0   oxyCODONE (OXY IR/ROXICODONE) 5 MG immediate release tablet, Take 1 tablet (5 mg total) by mouth every 4 (four) hours as needed for severe pain (pain score 7-10)., Disp: 15 tablet, Rfl: 0   RYBELSUS 7 MG TABS, Take 1 tablet by mouth daily., Disp: , Rfl:    senna-docusate (SENOKOT-S) 8.6-50 MG tablet, Take 2 tablets by mouth at bedtime. For AFTER surgery, do not take if having diarrhea, Disp: 30 tablet, Rfl: 0   simvastatin (ZOCOR) 40 MG tablet, Take 40 mg by mouth at bedtime., Disp: , Rfl:    ASSESSMENT AND PLAN: .      ICD-10-CM   1. Pre-op evaluation  Z01.818 EKG 12-Lead    2. Essential hypertension  I10     3. Hypercholesterolemia  E78.00 ezetimibe (ZETIA) 10 MG tablet    4. Type 2 diabetes mellitus without complication, without long-term current use of insulin (HCC)  E11.9      Assessment and Plan  1. 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 Surgery planned with Dr. Denese Killings. -Continue current management plan with Dr. Pricilla Holm. -Low risk from cardiac standpoint.  Hypertension Blood pressure well controlled, currently off antihypertensive medication due to low appetite and resultant low blood pressure. -Consider re-initiation of antihypertensive medication if blood pressure increases. Specifically, consider losartan. Message sent to primary care provider regarding this plan.  Hyperlipidemia Total cholesterol 202, triglycerides 93, HDL 44, LDL 139. Currently on simvastatin 40mg  at bedtime. -Add Zetia to  current regimen. Take with simvastatin in the evening. -Follow-up with PCP for further evaluation of lipid status and for refills of Zetia.  Goal LDL <70 in view of diabetes mellitus.  Diabetes Mellitus Well controlled with metformin and Rybelsus. -Continue current management.  Preoperative Cardiac Risk Low cardiac risk for upcoming surgery. No chest pain or shortness of breath. EKG shows normal sinus rhythm, heart rate of 97 beats per minute, normal axis, LVH, nonspecific T wave abnormality. No significant change from previous EKG. No signs of heart failure on physical exam. -No need for stress test. Follow up as needed for symptoms of chest pain or shortness of breath.  She has had a negative/low risk nuclear stress test in 2022.  Follow up with primary care provider for medication refills and lipid management.   Signed,  Danielle Decamp, MD, [facility] 01/29/2023, 6:16 AM [facility] [address] [place], [place] [address] Phone: [phone]. Fax:  [phone]

## 2023-01-29 ENCOUNTER — Encounter: Payer: Self-pay | Admitting: Cardiology

## 2023-01-31 ENCOUNTER — Telehealth: Payer: Self-pay | Admitting: *Deleted

## 2023-01-31 ENCOUNTER — Encounter (HOSPITAL_COMMUNITY): Payer: Self-pay

## 2023-01-31 NOTE — Telephone Encounter (Signed)
-----   Message from Danielle Mullins sent at 01/24/2023  3:59 PM EST ----- She is scheduled for surgery on 02/03/23. She will need to be type and crossed for 2 units to have ready for surgery. Can you make some calls closer to the date to make sure this gets done? THank you!

## 2023-01-31 NOTE — Progress Notes (Signed)
Case: [id number] Date/Time: 02/03/23 1313   Procedures:      DIAGNOSTIC LAPAROSCOPY POSSIBLE XI ROBOTIC ASSISTED VERSUS  TOTAL HYSTERECTOMY WITH BILATERAL SALPINGO OOPHORECTOMY (Bilateral)     POSSIBLE LYMPH NODE DISSECTION     POSSIBLE OMENTECTOMY   Anesthesia type: General   Pre-op diagnosis: ENDOMETRIAL CANCER   Location: [facility] ROOM 05 / [facility]   Surgeons: Carver Fila, MD       DISCUSSION: Danielle Mullins is a 62 yo female who presents to PAT prior to surgery above. PMH of HTN, Hx of mobitz type 2 heart block, bradycardia, OSA (no CPAP use), DM (A1c 6.3), arthritis, anemia.  Patient presented to the hospital on 03/24/20 with dysnea and chest pain. EKG revealed Mobitz 2 AV block, she was recommended pacemaker put opted not to have implantation done. Last saw Cardiology on 01/28/23 for pre op clearance. Asymptomatic from cardiac standpoint but has had weight loss, anorexia, abdominal pain, right hip pain from tumor. She was cleared for surgery as low risk:  "Preoperative Cardiac Risk Low cardiac risk for upcoming surgery. No chest pain or shortness of breath. EKG shows normal sinus rhythm, heart rate of 97 beats per minute, normal axis, LVH, nonspecific T wave abnormality. No significant change from previous EKG. No signs of heart failure on physical exam. -No need for stress test. Follow up as needed for symptoms of chest pain or shortness of breath.  She has had a negative/low risk nuclear stress test in 2022."    VS: BP 132/82   Pulse 99   Temp 36.6 C (Oral)   Resp 14   Ht 5\' 6"  (1.676 m)   Wt 84.8 kg   SpO2 100%   BMI 30.18 kg/m   PROVIDERS: [name], FNP   LABS: Labs reviewed: Acceptable for surgery. (all labs ordered are listed, but only abnormal results are displayed)  Labs Reviewed  CBC - Abnormal; Notable for the following components:      Result Value   RBC 3.76 (*)    Hemoglobin 9.1 (*)    HCT 29.6 (*)    MCV 78.7 (*)    MCH 24.2 (*)     Platelets 480 (*)    All other components within normal limits  COMPREHENSIVE METABOLIC PANEL - Abnormal; Notable for the following components:   Potassium 3.4 (*)    Glucose, Bld 178 (*)    Creatinine, Ser 1.07 (*)    Calcium 8.8 (*)    Albumin 3.0 (*)    GFR, Estimated 59 (*)    All other components within normal limits  GLUCOSE, CAPILLARY - Abnormal; Notable for the following components:   Glucose-Capillary 140 (*)    All other components within normal limits  TYPE AND SCREEN     IMAGES:  CXR 01/14/23:  IMPRESSION: Lower lung volumes with mild bibasilar atelectasis. No acute cardiopulmonary process. No radiographic evidence of thoracic malignancy.  EKG 01/28/23   Normal sinus rhythm at rate of 97 bpm, normal axis, LVH.  Nonspecific T abnormality.  No significant change from 01/24/2023.  Compared to 03/24/2020, Mobitz 2 second-degree AV block not present. Confirmed by Delrae Rend  CV: Exercise tetrofosmin stress test  04/21/2020: Normal ECG stress. The patient exercised for 5 minutes and 0 seconds of a Bruce protocol, achieving approximately 7.02 METs. Baseline heart rate was 70 bpm. A maximum heart rate of 150 beats per minute was achieved, which is 94% of the maximum predicted heart rate response.  No heart block and  stress EKG negative for ischemia. No chest pain. Normal BP response.  There is a small sized mild reversible defect in the inferior region. noted superimposed on breast tissue attenuation. Overall LV systolic function is normal without regional wall motion abnormalities. Stress LV EF: 60-65%.  No previous exam available for comparison. Low risk study.    Echocardiogram 03/24/2020:   Patient appears to be in Mobitz II AV block. Left ventricular ejection fraction, by estimation, is 60 to 65%. The left ventricle has normal function. The left ventricle has no regional wall motion abnormalities. Indeterminate diastolic filling due to E-A fusion.Right ventricular  systolic function is normal.  The right ventricular size is normal. The mitral valve is normal in structure. No evidence of mitral valve regurgitation. No evidence of mitral stenosis. The aortic valve is normal in structure. Aortic valve regurgitation is not visualized. No aortic stenosis is present. The inferior vena cava is normal in size with <50% respiratory variability, suggesting right atrial pressure of 8 mmHg. No significant change from 09/16/2015.  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 No pertinent surgical history.  MEDICATIONS:  ezetimibe (ZETIA) 10 MG tablet   ferrous sulfate 325 (65 FE) MG tablet   gabapentin (NEURONTIN) 100 MG capsule   metFORMIN (GLUCOPHAGE) 500 MG tablet   metroNIDAZOLE (FLAGYL) 500 MG tablet   oxyCODONE (OXY IR/ROXICODONE) 5 MG immediate release tablet   RYBELSUS 7 MG TABS   senna-docusate (SENOKOT-S) 8.6-50 MG tablet   simvastatin (ZOCOR) 40 MG tablet   No current facility-administered medications for this encounter.   Marcille Blanco [facility] Short Stay/Anesthesiology [facility] Phone [phone] 01/31/2023 8:46 AM

## 2023-01-31 NOTE — Telephone Encounter (Signed)
Spoke with Myriam Jacobson in [facility] and note place on patient's chart in regards to orders for type and screen for 2 units of RBC's to have ready for surgery on 02/03/23 with Dr. Pricilla Holm.

## 2023-01-31 NOTE — Anesthesia Preprocedure Evaluation (Signed)
Anesthesia Evaluation    Airway        Dental   Pulmonary           Cardiovascular hypertension,      Neuro/Psych    GI/Hepatic   Endo/Other  diabetes    Renal/GU      Musculoskeletal   Abdominal   Peds  Hematology   Anesthesia Other Findings   Reproductive/Obstetrics                              Anesthesia Physical Anesthesia Plan  ASA:   Anesthesia Plan:    Post-op Pain Management:    Induction:   PONV Risk Score and Plan:   Airway Management Planned:   Additional Equipment:   Intra-op Plan:   Post-operative Plan:   Informed Consent:   Plan Discussed with:   Anesthesia Plan Comments: (See PAT note from 12/23 by Sherlie Ban PA-C )         Anesthesia Quick Evaluation

## 2023-02-01 ENCOUNTER — Telehealth: Payer: Self-pay

## 2023-02-01 NOTE — Telephone Encounter (Signed)
 Telephone call to check on pre-operative status.  Patient compliant with pre-operative instructions.  Reinforced nothing to eat after midnight. Clear liquids until 10:15. Patient to arrive at 11:15.  No questions or concerns voiced.  Instructed to call for any needs.  Pt is aware to start checking blood sugars 4x's a day today and tomorrow, also the day of surgery check levels once she gets up and every two hours until arrives at hospital on Thursday. Documenting levels and take to hospital.   Discussed the day before surgery she can take Metformin  as prescribed but do not take  Rybelsus . Also the day of surgery no Metformin  or Rybelsus . Pt voiced an understanding with repeat of instructions.

## 2023-02-03 ENCOUNTER — Ambulatory Visit (HOSPITAL_COMMUNITY): Payer: Self-pay | Admitting: Medical

## 2023-02-03 ENCOUNTER — Other Ambulatory Visit: Payer: Self-pay

## 2023-02-03 ENCOUNTER — Encounter (HOSPITAL_COMMUNITY)
Admission: AD | Disposition: A | Discharge status: Home / Self Care | Payer: Self-pay | Source: Home / Self Care | Attending: Gynecologic Oncology

## 2023-02-03 ENCOUNTER — Inpatient Hospital Stay (HOSPITAL_COMMUNITY)
Admission: AD | Admit: 2023-02-03 | Discharge: 2023-02-05 | DRG: 740 | Disposition: A | Discharge status: Home / Self Care | Payer: BC Managed Care – PPO | Attending: Gynecologic Oncology | Admitting: Gynecologic Oncology

## 2023-02-03 ENCOUNTER — Ambulatory Visit (HOSPITAL_COMMUNITY): Payer: BC Managed Care – PPO | Admitting: Anesthesiology

## 2023-02-03 ENCOUNTER — Encounter (HOSPITAL_COMMUNITY): Payer: Self-pay | Admitting: Gynecologic Oncology

## 2023-02-03 DIAGNOSIS — C775 Secondary and unspecified malignant neoplasm of intrapelvic lymph nodes: Secondary | ICD-10-CM | POA: Diagnosis present

## 2023-02-03 DIAGNOSIS — E7800 Pure hypercholesterolemia, unspecified: Secondary | ICD-10-CM

## 2023-02-03 DIAGNOSIS — Z6830 Body mass index (BMI) 30.0-30.9, adult: Secondary | ICD-10-CM

## 2023-02-03 DIAGNOSIS — Z7985 Long-term (current) use of injectable non-insulin antidiabetic drugs: Secondary | ICD-10-CM

## 2023-02-03 DIAGNOSIS — I119 Hypertensive heart disease without heart failure: Secondary | ICD-10-CM | POA: Diagnosis present

## 2023-02-03 DIAGNOSIS — C541 Malignant neoplasm of endometrium: Principal | ICD-10-CM

## 2023-02-03 DIAGNOSIS — R34 Anuria and oliguria: Secondary | ICD-10-CM

## 2023-02-03 DIAGNOSIS — E785 Hyperlipidemia, unspecified: Secondary | ICD-10-CM | POA: Diagnosis present

## 2023-02-03 DIAGNOSIS — R141 Gas pain: Secondary | ICD-10-CM

## 2023-02-03 DIAGNOSIS — Z833 Family history of diabetes mellitus: Secondary | ICD-10-CM

## 2023-02-03 DIAGNOSIS — G4730 Sleep apnea, unspecified: Secondary | ICD-10-CM | POA: Diagnosis present

## 2023-02-03 DIAGNOSIS — J029 Acute pharyngitis, unspecified: Secondary | ICD-10-CM

## 2023-02-03 DIAGNOSIS — I959 Hypotension, unspecified: Secondary | ICD-10-CM | POA: Diagnosis not present

## 2023-02-03 DIAGNOSIS — Z7984 Long term (current) use of oral hypoglycemic drugs: Secondary | ICD-10-CM

## 2023-02-03 DIAGNOSIS — D62 Acute posthemorrhagic anemia: Secondary | ICD-10-CM | POA: Diagnosis not present

## 2023-02-03 DIAGNOSIS — Z79899 Other long term (current) drug therapy: Secondary | ICD-10-CM

## 2023-02-03 DIAGNOSIS — Z886 Allergy status to analgesic agent status: Secondary | ICD-10-CM

## 2023-02-03 DIAGNOSIS — Z8249 Family history of ischemic heart disease and other diseases of the circulatory system: Secondary | ICD-10-CM

## 2023-02-03 DIAGNOSIS — R0602 Shortness of breath: Secondary | ICD-10-CM

## 2023-02-03 DIAGNOSIS — E119 Type 2 diabetes mellitus without complications: Secondary | ICD-10-CM

## 2023-02-03 DIAGNOSIS — D500 Iron deficiency anemia secondary to blood loss (chronic): Secondary | ICD-10-CM

## 2023-02-03 DIAGNOSIS — M25511 Pain in right shoulder: Secondary | ICD-10-CM | POA: Diagnosis not present

## 2023-02-03 DIAGNOSIS — D509 Iron deficiency anemia, unspecified: Secondary | ICD-10-CM | POA: Diagnosis present

## 2023-02-03 DIAGNOSIS — C548 Malignant neoplasm of overlapping sites of corpus uteri: Principal | ICD-10-CM | POA: Diagnosis present

## 2023-02-03 DIAGNOSIS — N179 Acute kidney failure, unspecified: Secondary | ICD-10-CM

## 2023-02-03 DIAGNOSIS — C55 Malignant neoplasm of uterus, part unspecified: Secondary | ICD-10-CM | POA: Diagnosis present

## 2023-02-03 DIAGNOSIS — E861 Hypovolemia: Secondary | ICD-10-CM | POA: Diagnosis present

## 2023-02-03 DIAGNOSIS — E669 Obesity, unspecified: Secondary | ICD-10-CM | POA: Diagnosis present

## 2023-02-03 HISTORY — PX: LYMPH NODE DISSECTION: SHX5087

## 2023-02-03 HISTORY — PX: ROBOTIC ASSISTED TOTAL HYSTERECTOMY WITH BILATERAL SALPINGO OOPHERECTOMY: SHX6086

## 2023-02-03 LAB — GLUCOSE, CAPILLARY
Glucose-Capillary: 191 mg/dL — ABNORMAL HIGH (ref 70–99)
Glucose-Capillary: 122 mg/dL — ABNORMAL HIGH (ref 70–99)
Glucose-Capillary: 177 mg/dL — ABNORMAL HIGH (ref 70–99)
Glucose-Capillary: 184 mg/dL — ABNORMAL HIGH (ref 70–99)

## 2023-02-03 LAB — PREPARE RBC (CROSSMATCH)

## 2023-02-03 SURGERY — HYSTERECTOMY, TOTAL, ROBOT-ASSISTED, LAPAROSCOPIC, WITH BILATERAL SALPINGO-OOPHORECTOMY
Anesthesia: General

## 2023-02-03 MED ORDER — LACTATED RINGERS IV SOLN: INTRAVENOUS | Status: DC

## 2023-02-03 MED ORDER — ENOXAPARIN SODIUM 40 MG/0.4ML IJ SOSY
40.0000 mg | PREFILLED_SYRINGE | INTRAMUSCULAR | Status: DC
  Administered 2023-02-04: 40 mg via SUBCUTANEOUS
  Filled 2023-02-03: qty 0.4

## 2023-02-03 MED ORDER — ONDANSETRON HCL 4 MG/2ML IJ SOLN
4.0000 mg | Freq: Four times a day (QID) | INTRAMUSCULAR | Status: DC | PRN
  Administered 2023-02-05: 4 mg via INTRAVENOUS
  Filled 2023-02-03: qty 2

## 2023-02-03 MED ORDER — PROPOFOL 10 MG/ML IV BOLUS
INTRAVENOUS | Status: AC
  Filled 2023-02-03: qty 20

## 2023-02-03 MED ORDER — PHENYLEPHRINE HCL (PRESSORS) 10 MG/ML IV SOLN
INTRAVENOUS | Status: AC
  Filled 2023-02-03: qty 1

## 2023-02-03 MED ORDER — MIDAZOLAM HCL 2 MG/2ML IJ SOLN
INTRAMUSCULAR | Status: AC
  Filled 2023-02-03: qty 2

## 2023-02-03 MED ORDER — GABAPENTIN 100 MG PO CAPS
100.0000 mg | ORAL_CAPSULE | Freq: Every evening | ORAL | Status: DC | PRN
Start: 2023-02-03 — End: 2023-02-05

## 2023-02-03 MED ORDER — FENTANYL CITRATE (PF) 100 MCG/2ML IJ SOLN
INTRAMUSCULAR | Status: AC
  Filled 2023-02-03: qty 2

## 2023-02-03 MED ORDER — HYDROMORPHONE HCL 1 MG/ML IJ SOLN
INTRAMUSCULAR | Status: AC
  Filled 2023-02-03: qty 1

## 2023-02-03 MED ORDER — SENNOSIDES-DOCUSATE SODIUM 8.6-50 MG PO TABS
2.0000 | ORAL_TABLET | Freq: Every day | ORAL | Status: DC
Start: 2023-02-03 — End: 2023-02-05
  Administered 2023-02-03 – 2023-02-04 (×2): 2 via ORAL
  Filled 2023-02-03 (×2): qty 2

## 2023-02-03 MED ORDER — SUGAMMADEX SODIUM 200 MG/2ML IV SOLN
INTRAVENOUS | Status: DC | PRN
  Administered 2023-02-03: 200 mg via INTRAVENOUS

## 2023-02-03 MED ORDER — OXYCODONE HCL 5 MG PO TABS
5.0000 mg | ORAL_TABLET | ORAL | Status: DC | PRN
  Administered 2023-02-04: 5 mg via ORAL
  Administered 2023-02-04: 10 mg via ORAL
  Filled 2023-02-03: qty 1
  Filled 2023-02-03: qty 2

## 2023-02-03 MED ORDER — OXYCODONE HCL 5 MG/5ML PO SOLN: 5.0000 mg | Freq: Once | ORAL | Status: DC | PRN

## 2023-02-03 MED ORDER — BUPIVACAINE LIPOSOME 1.3 % IJ SUSP
INTRAMUSCULAR | Status: DC | PRN
  Administered 2023-02-03: 20 mL

## 2023-02-03 MED ORDER — LACTATED RINGERS IV SOLN: INTRAVENOUS | Status: DC | PRN

## 2023-02-03 MED ORDER — STERILE WATER FOR IRRIGATION IR SOLN
Status: DC | PRN
  Administered 2023-02-03: 2000 mL

## 2023-02-03 MED ORDER — ONDANSETRON HCL 4 MG/2ML IJ SOLN: 4.0000 mg | Freq: Once | INTRAMUSCULAR | Status: DC | PRN

## 2023-02-03 MED ORDER — ORAL CARE MOUTH RINSE: 15.0000 mL | Freq: Once | OROMUCOSAL | Status: AC

## 2023-02-03 MED ORDER — ONDANSETRON HCL 4 MG/2ML IJ SOLN
INTRAMUSCULAR | Status: AC
  Filled 2023-02-03: qty 2

## 2023-02-03 MED ORDER — KETAMINE HCL 10 MG/ML IJ SOLN
INTRAMUSCULAR | Status: DC | PRN
  Administered 2023-02-03: 15 mg via INTRAVENOUS
  Administered 2023-02-03: 25 mg via INTRAVENOUS

## 2023-02-03 MED ORDER — DEXAMETHASONE SODIUM PHOSPHATE 4 MG/ML IJ SOLN
4.0000 mg | INTRAMUSCULAR | Status: AC
  Administered 2023-02-03: 5 mg via INTRAVENOUS

## 2023-02-03 MED ORDER — SIMETHICONE 80 MG PO CHEW
80.0000 mg | CHEWABLE_TABLET | Freq: Four times a day (QID) | ORAL | Status: DC | PRN
  Administered 2023-02-05: 80 mg via ORAL
  Filled 2023-02-03: qty 1

## 2023-02-03 MED ORDER — PROPOFOL 10 MG/ML IV BOLUS
INTRAVENOUS | Status: DC | PRN
  Administered 2023-02-03: 200 mg via INTRAVENOUS

## 2023-02-03 MED ORDER — LIDOCAINE HCL (CARDIAC) PF 100 MG/5ML IV SOSY
PREFILLED_SYRINGE | INTRAVENOUS | Status: DC | PRN
  Administered 2023-02-03: 80 mg via INTRAVENOUS

## 2023-02-03 MED ORDER — METRONIDAZOLE 500 MG/100ML IV SOLN
500.0000 mg | INTRAVENOUS | Status: AC
  Administered 2023-02-03: 500 mg via INTRAVENOUS
  Filled 2023-02-03: qty 100

## 2023-02-03 MED ORDER — ROCURONIUM BROMIDE 100 MG/10ML IV SOLN
INTRAVENOUS | Status: DC | PRN
  Administered 2023-02-03: 80 mg via INTRAVENOUS
  Administered 2023-02-03: 20 mg via INTRAVENOUS

## 2023-02-03 MED ORDER — LACTATED RINGERS IR SOLN
Status: DC | PRN
  Administered 2023-02-03: 1000 mL

## 2023-02-03 MED ORDER — CHLORHEXIDINE GLUCONATE 0.12 % MT SOLN
15.0000 mL | Freq: Once | OROMUCOSAL | Status: AC
  Administered 2023-02-03: 15 mL via OROMUCOSAL

## 2023-02-03 MED ORDER — DEXMEDETOMIDINE HCL IN NACL 80 MCG/20ML IV SOLN
INTRAVENOUS | Status: DC | PRN
  Administered 2023-02-03: 8 ug via INTRAVENOUS

## 2023-02-03 MED ORDER — KETAMINE HCL 50 MG/5ML IJ SOSY
PREFILLED_SYRINGE | INTRAMUSCULAR | Status: AC
  Filled 2023-02-03: qty 5

## 2023-02-03 MED ORDER — OXYCODONE HCL 5 MG PO TABS: 5.0000 mg | ORAL_TABLET | Freq: Once | ORAL | Status: DC | PRN

## 2023-02-03 MED ORDER — ACETAMINOPHEN 500 MG PO TABS
1000.0000 mg | ORAL_TABLET | ORAL | Status: AC
  Administered 2023-02-03: 1000 mg via ORAL
  Filled 2023-02-03: qty 2

## 2023-02-03 MED ORDER — HYDROMORPHONE HCL 1 MG/ML IJ SOLN: 0.2000 mg | INTRAMUSCULAR | Status: DC | PRN

## 2023-02-03 MED ORDER — SCOPOLAMINE 1 MG/3DAYS TD PT72
1.0000 | MEDICATED_PATCH | TRANSDERMAL | Status: DC
  Administered 2023-02-03: 1.5 mg via TRANSDERMAL
  Filled 2023-02-03: qty 1

## 2023-02-03 MED ORDER — HEPARIN SODIUM (PORCINE) 5000 UNIT/ML IJ SOLN
5000.0000 [IU] | INTRAMUSCULAR | Status: AC
  Administered 2023-02-03: 5000 [IU] via SUBCUTANEOUS
  Filled 2023-02-03: qty 1

## 2023-02-03 MED ORDER — INSULIN ASPART 100 UNIT/ML IJ SOLN
0.0000 [IU] | INTRAMUSCULAR | Status: DC | PRN
  Administered 2023-02-03: 4 [IU] via SUBCUTANEOUS
  Filled 2023-02-03: qty 1

## 2023-02-03 MED ORDER — ONDANSETRON HCL 4 MG/2ML IJ SOLN
INTRAMUSCULAR | Status: DC | PRN
  Administered 2023-02-03: 4 mg via INTRAVENOUS

## 2023-02-03 MED ORDER — HYDROMORPHONE HCL 1 MG/ML IJ SOLN
0.2500 mg | INTRAMUSCULAR | Status: DC | PRN
  Administered 2023-02-03 (×3): 0.5 mg via INTRAVENOUS

## 2023-02-03 MED ORDER — DEXAMETHASONE SODIUM PHOSPHATE 10 MG/ML IJ SOLN
INTRAMUSCULAR | Status: AC
  Filled 2023-02-03: qty 1

## 2023-02-03 MED ORDER — FENTANYL CITRATE (PF) 250 MCG/5ML IJ SOLN
INTRAMUSCULAR | Status: AC
  Filled 2023-02-03: qty 5

## 2023-02-03 MED ORDER — EZETIMIBE 10 MG PO TABS
10.0000 mg | ORAL_TABLET | Freq: Every day | ORAL | Status: DC
Start: 2023-02-04 — End: 2023-02-05
  Administered 2023-02-04 – 2023-02-05 (×2): 10 mg via ORAL
  Filled 2023-02-03 (×2): qty 1

## 2023-02-03 MED ORDER — BUPIVACAINE LIPOSOME 1.3 % IJ SUSP
INTRAMUSCULAR | Status: AC
  Filled 2023-02-03: qty 20

## 2023-02-03 MED ORDER — BUPIVACAINE HCL 0.25 % IJ SOLN
INTRAMUSCULAR | Status: DC | PRN
  Administered 2023-02-03: 50 mL

## 2023-02-03 MED ORDER — PROPOFOL 500 MG/50ML IV EMUL
INTRAVENOUS | Status: DC | PRN
  Administered 2023-02-03: 25 ug/kg/min via INTRAVENOUS

## 2023-02-03 MED ORDER — CEFAZOLIN SODIUM-DEXTROSE 2-4 GM/100ML-% IV SOLN
2.0000 g | INTRAVENOUS | Status: AC
  Administered 2023-02-03: 2 g via INTRAVENOUS
  Filled 2023-02-03: qty 100

## 2023-02-03 MED ORDER — KCL IN DEXTROSE-NACL 10-5-0.45 MEQ/L-%-% IV SOLN
INTRAVENOUS | Status: DC
  Filled 2023-02-03: qty 1000

## 2023-02-03 MED ORDER — ACETAMINOPHEN 500 MG PO TABS
1000.0000 mg | ORAL_TABLET | Freq: Four times a day (QID) | ORAL | Status: DC
  Administered 2023-02-04 – 2023-02-05 (×6): 1000 mg via ORAL
  Filled 2023-02-03 (×6): qty 2

## 2023-02-03 MED ORDER — DROPERIDOL 2.5 MG/ML IJ SOLN: 0.6250 mg | Freq: Once | INTRAMUSCULAR | Status: DC | PRN

## 2023-02-03 MED ORDER — POVIDONE-IODINE 10 % EX SWAB: 2.0000 | Freq: Once | CUTANEOUS | Status: DC

## 2023-02-03 MED ORDER — MIDAZOLAM HCL 5 MG/5ML IJ SOLN
INTRAMUSCULAR | Status: DC | PRN
  Administered 2023-02-03: 2 mg via INTRAVENOUS

## 2023-02-03 MED ORDER — INSULIN ASPART 100 UNIT/ML IJ SOLN
0.0000 [IU] | Freq: Three times a day (TID) | INTRAMUSCULAR | Status: DC
  Administered 2023-02-04: 1 [IU] via SUBCUTANEOUS
  Administered 2023-02-04 (×2): 2 [IU] via SUBCUTANEOUS

## 2023-02-03 MED ORDER — SIMVASTATIN 40 MG PO TABS
40.0000 mg | ORAL_TABLET | Freq: Every day | ORAL | Status: DC
Start: 2023-02-03 — End: 2023-02-05
  Administered 2023-02-03 – 2023-02-04 (×2): 40 mg via ORAL
  Filled 2023-02-03 (×2): qty 1

## 2023-02-03 MED ORDER — FENTANYL CITRATE (PF) 100 MCG/2ML IJ SOLN
INTRAMUSCULAR | Status: DC | PRN
  Administered 2023-02-03 (×3): 50 ug via INTRAVENOUS
  Administered 2023-02-03: 200 ug via INTRAVENOUS

## 2023-02-03 MED ORDER — ALUM & MAG HYDROXIDE-SIMETH 200-200-20 MG/5ML PO SUSP: 30.0000 mL | ORAL | Status: DC | PRN

## 2023-02-03 MED ORDER — BUPIVACAINE HCL 0.25 % IJ SOLN
INTRAMUSCULAR | Status: AC
  Filled 2023-02-03: qty 1

## 2023-02-03 MED ORDER — TRAMADOL HCL 50 MG PO TABS
50.0000 mg | ORAL_TABLET | Freq: Four times a day (QID) | ORAL | Status: DC | PRN
Start: 2023-02-03 — End: 2023-02-05
  Administered 2023-02-04: 50 mg via ORAL
  Filled 2023-02-03 (×2): qty 1

## 2023-02-03 MED ORDER — ONDANSETRON HCL 4 MG PO TABS
4.0000 mg | ORAL_TABLET | Freq: Four times a day (QID) | ORAL | Status: DC | PRN
  Administered 2023-02-05: 4 mg via ORAL
  Filled 2023-02-03: qty 1

## 2023-02-03 SURGICAL SUPPLY — 77 items
APPLICATOR SURGIFLO ENDO (HEMOSTASIS) IMPLANT
BAG COUNTER SPONGE SURGICOUNT (BAG) IMPLANT
BAG LAPAROSCOPIC 12 15 PORT 16 (BASKET) IMPLANT
BAG RETRIEVAL 12/15 (BASKET) ×2 IMPLANT
BLADE SURG SZ10 CARB STEEL (BLADE) IMPLANT
COVER BACK TABLE 60X90IN (DRAPES) ×2 IMPLANT
COVER TIP SHEARS 8 DVNC (MISCELLANEOUS) ×2 IMPLANT
DERMABOND ADVANCED .7 DNX12 (GAUZE/BANDAGES/DRESSINGS) ×2 IMPLANT
DRAPE ARM DVNC X/XI (DISPOSABLE) ×8 IMPLANT
DRAPE COLUMN DVNC XI (DISPOSABLE) ×2 IMPLANT
DRAPE SHEET LG 3/4 BI-LAMINATE (DRAPES) ×2 IMPLANT
DRAPE SURG IRRIG POUCH 19X23 (DRAPES) ×2 IMPLANT
DRAPE WARM FLUID 44X44 (DRAPES) IMPLANT
DRIVER NDL MEGA SUTCUT DVNCXI (INSTRUMENTS) ×2 IMPLANT
DRIVER NDLE MEGA SUTCUT DVNCXI (INSTRUMENTS) ×2 IMPLANT
DRSG OPSITE POSTOP 4X6 (GAUZE/BANDAGES/DRESSINGS) IMPLANT
DRSG OPSITE POSTOP 4X8 (GAUZE/BANDAGES/DRESSINGS) IMPLANT
ELECT PENCIL ROCKER SW 15FT (MISCELLANEOUS) IMPLANT
ELECT REM PT RETURN 15FT ADLT (MISCELLANEOUS) ×2 IMPLANT
FORCEPS BPLR FENES DVNC XI (FORCEP) ×2 IMPLANT
FORCEPS PROGRASP DVNC XI (FORCEP) ×2 IMPLANT
GAUZE 4X4 16PLY ~~LOC~~+RFID DBL (SPONGE) ×4 IMPLANT
GLOVE BIO SURGEON STRL SZ 6 (GLOVE) ×8 IMPLANT
GLOVE BIO SURGEON STRL SZ 6.5 (GLOVE) ×2 IMPLANT
GOWN STRL REUS W/ TWL LRG LVL3 (GOWN DISPOSABLE) ×8 IMPLANT
GRASPER SUT TROCAR 14GX15 (MISCELLANEOUS) IMPLANT
HANDLE SUCTION POOLE (INSTRUMENTS) IMPLANT
HOLDER FOLEY CATH W/STRAP (MISCELLANEOUS) IMPLANT
IRRIG SUCT STRYKERFLOW 2 WTIP (MISCELLANEOUS) ×2 IMPLANT
IRRIGATION SUCT STRKRFLW 2 WTP (MISCELLANEOUS) ×2 IMPLANT
KIT PROCEDURE DVNC SI (MISCELLANEOUS) IMPLANT
KIT TURNOVER KIT A (KITS) IMPLANT
LIGASURE IMPACT 36 18CM CVD LR (INSTRUMENTS) IMPLANT
MANIPULATOR ADVINCU DEL 3.0 PL (MISCELLANEOUS) IMPLANT
MANIPULATOR ADVINCU DEL 3.5 PL (MISCELLANEOUS) IMPLANT
MANIPULATOR UTERINE 4.5 ZUMI (MISCELLANEOUS) IMPLANT
NDL HYPO 21X1.5 SAFETY (NEEDLE) ×2 IMPLANT
NDL SPNL 18GX3.5 QUINCKE PK (NEEDLE) IMPLANT
NEEDLE HYPO 21X1.5 SAFETY (NEEDLE) ×4 IMPLANT
NEEDLE SPNL 18GX3.5 QUINCKE PK (NEEDLE) IMPLANT
OBTURATOR OPTICAL STND 8 DVNC (TROCAR) ×2 IMPLANT
OBTURATOR OPTICALSTD 8 DVNC (TROCAR) ×2 IMPLANT
PACK ROBOT GYN CUSTOM WL (TRAY / TRAY PROCEDURE) ×2 IMPLANT
PAD POSITIONING PINK XL (MISCELLANEOUS) ×2 IMPLANT
PORT ACCESS TROCAR AIRSEAL 12 (TROCAR) IMPLANT
SCISSORS MNPLR CVD DVNC XI (INSTRUMENTS) ×2 IMPLANT
SCRUB CHG 4% DYNA-HEX 4OZ (MISCELLANEOUS) IMPLANT
SEAL UNIV 5-12 XI (MISCELLANEOUS) ×8 IMPLANT
SET TRI-LUMEN FLTR TB AIRSEAL (TUBING) ×2 IMPLANT
SPIKE FLUID TRANSFER (MISCELLANEOUS) ×2 IMPLANT
SPONGE T-LAP 18X18 ~~LOC~~+RFID (SPONGE) IMPLANT
SUCTION POOLE HANDLE (INSTRUMENTS) ×2 IMPLANT
SURGIFLO W/THROMBIN 8M KIT (HEMOSTASIS) IMPLANT
SUT MNCRL AB 4-0 PS2 18 (SUTURE) IMPLANT
SUT PDS AB 1 TP1 96 (SUTURE) IMPLANT
SUT V-LOC 180 0-0 GS22 (SUTURE) IMPLANT
SUT VIC AB 0 CT1 27XBRD ANTBC (SUTURE) IMPLANT
SUT VIC AB 2-0 CT1 TAPERPNT 27 (SUTURE) IMPLANT
SUT VIC AB 4-0 PS2 18 (SUTURE) ×4 IMPLANT
SUT VICRYL 0 27 CT2 27 ABS (SUTURE) ×2 IMPLANT
SUT VICRYL 0 UR6 27IN ABS (SUTURE) IMPLANT
SUT VLOC 180 0 9IN GS21 (SUTURE) IMPLANT
SYR 10ML LL (SYRINGE) IMPLANT
SYR BULB IRRIG 60ML STRL (SYRINGE) IMPLANT
SYS BAG RETRIEVAL 10MM (BASKET) IMPLANT
SYS RETRIEVAL 5MM INZII UNIV (BASKET) ×4 IMPLANT
SYS WOUND ALEXIS 18CM MED (MISCELLANEOUS) IMPLANT
SYSTEM BAG RETRIEVAL 10MM (BASKET) IMPLANT
SYSTEM RETRIEVL 5MM INZII UNIV (BASKET) IMPLANT
SYSTEM WOUND ALEXIS 18CM MED (MISCELLANEOUS) IMPLANT
TRAP SPECIMEN MUCUS 40CC (MISCELLANEOUS) IMPLANT
TRAY FOLEY MTR SLVR 16FR STAT (SET/KITS/TRAYS/PACK) ×2 IMPLANT
TROCAR PORT AIRSEAL 5X120 (TROCAR) IMPLANT
TROCAR XCEL NON-BLD 5MMX100MML (ENDOMECHANICALS) IMPLANT
UNDERPAD 30X36 HEAVY ABSORB (UNDERPADS AND DIAPERS) ×4 IMPLANT
WATER STERILE IRR 1000ML POUR (IV SOLUTION) ×2 IMPLANT
YANKAUER SUCT BULB TIP 10FT TU (MISCELLANEOUS) IMPLANT

## 2023-02-03 NOTE — Interval H&P Note (Signed)
 History and Physical Interval Note:  02/03/2023 12:02 PM  Danielle Mullins  has presented today for surgery, with the diagnosis of ENDOMETRIAL CANCER.  The various methods of treatment have been discussed with the patient and family. After consideration of risks, benefits and other options for treatment, the patient has consented to  Procedure(s): DIAGNOSTIC LAPAROSCOPY POSSIBLE XI ROBOTIC ASSISTED VERSUS  TOTAL HYSTERECTOMY WITH BILATERAL SALPINGO OOPHORECTOMY (Bilateral) POSSIBLE LYMPH NODE DISSECTION (N/A) POSSIBLE OMENTECTOMY (N/A) as a surgical intervention.  The patient's history has been reviewed, patient examined, no change in status, stable for surgery.  I have reviewed the patient's chart and labs.  Questions were answered to the patient's satisfaction.     Comer JONELLE Dollar

## 2023-02-03 NOTE — Plan of Care (Signed)
  Problem: Education: Goal: Knowledge of General Education information will improve Description: Including pain rating scale, medication(s)/side effects and non-pharmacologic comfort measures 02/03/2023 1838 by Nikki Harlene CROME, RN Outcome: Progressing 02/03/2023 1838 by Nikki Harlene CROME, RN Outcome: Progressing   Problem: Activity: Goal: Risk for activity intolerance will decrease 02/03/2023 1838 by Nikki Harlene CROME, RN Outcome: Progressing 02/03/2023 1838 by Nikki Harlene CROME, RN Outcome: Progressing   Problem: Safety: Goal: Ability to remain free from injury will improve 02/03/2023 1838 by Nikki Harlene CROME, RN Outcome: Progressing 02/03/2023 1838 by Nikki Harlene CROME, RN Outcome: Progressing

## 2023-02-03 NOTE — Discharge Instructions (Addendum)
 AFTER SURGERY INSTRUCTIONS  Your medications were sent to a 24hr pharmacy.  [facility] [address] [place], [place] [address] [phone]   Return to work: 4-6 weeks if applicable   Activity: 1. Be up and out of the bed during the day.  Take a nap if needed.  You may walk up steps but be careful and use the hand rail.  Stair climbing will tire you more than you think, you may need to stop part way and rest.    2. No lifting or straining for 6 weeks over 10 pounds. No pushing, pulling, straining for 6 weeks.   3. No driving for 4-89 days when the following criteria have been met: Do not drive if you are taking narcotic pain medicine and make sure that your reaction time has returned.    4. You can shower as soon as the next day after surgery. Shower daily.  Use your regular soap and water  (not directly on the incision) and pat your incision(s) dry afterwards; don't rub.  No tub baths or submerging your body in water  until cleared by your surgeon. If you have the soap that was given to you by pre-surgical testing that was used before surgery, you do not need to use it afterwards because this can irritate your incisions.    5. No sexual activity and nothing in the vagina for 10-12 weeks.   6. You may experience a small amount of clear drainage from your incisions, which is normal.  If the drainage persists, increases, or changes color please call the office.   7. Do not use creams, lotions, or ointments such as neosporin on your incisions after surgery until advised by your surgeon because they can cause removal of the dermabond glue on your incisions.     8. You may experience vaginal spotting after surgery or when the stitches at the top of the vagina begin to dissolve.  The spotting is normal but if you experience heavy bleeding, call our office.   9. Take Tylenol  first for pain if you are able to take these medication and only use narcotic pain medication for severe pain not relieved  by the Tylenol .  Monitor your Tylenol  intake to a max of 4,000 mg in a 24 hour period.    Diet: 1. Low sodium Heart Healthy Diet is recommended but you are cleared to resume your normal (before surgery) diet after your procedure.   2. It is safe to use a laxative, such as Miralax  or Colace, if you have difficulty moving your bowels before surgery. You have been prescribed Sennakot-S to take at bedtime every evening after surgery to keep bowel movements regular and to prevent constipation.     Wound Care: 1. Keep clean and dry.  Shower daily.   Reasons to call the Doctor: Fever - Oral temperature greater than 100.4 degrees Fahrenheit Foul-smelling vaginal discharge Difficulty urinating Nausea and vomiting Increased pain at the site of the incision that is unrelieved with pain medicine. Difficulty breathing with or without chest pain New calf pain especially if only on one side Sudden, continuing increased vaginal bleeding with or without clots.   Contacts: For questions or concerns you should contact:   Dr. Comer Dollar at [phone]   Eleanor Epps, NP at [phone]   After Hours: call [phone] and have the GYN Oncologist paged/contacted (after 5 pm or on the weekends). You will speak with an after hours RN and let he or she know you have had  surgery.   Messages sent via mychart are for non-urgent matters and are not responded to after hours so for urgent needs, please call the after hours number.

## 2023-02-03 NOTE — Anesthesia Procedure Notes (Signed)
 Procedure Name: Intubation Date/Time: 02/03/2023 1:47 PM  Performed by: Dartha Meckel, CRNAPre-anesthesia Checklist: Patient identified, Emergency Drugs available, Suction available and Patient being monitored Patient Re-evaluated:Patient Re-evaluated prior to induction Oxygen Delivery Method: Circle system utilized Preoxygenation: Pre-oxygenation with 100% oxygen Induction Type: IV induction Ventilation: Mask ventilation without difficulty Laryngoscope Size: Mac and 3 Grade View: Grade I Tube type: Oral Tube size: 7.0 mm Number of attempts: 1 Airway Equipment and Method: Stylet and Oral airway Placement Confirmation: ETT inserted through vocal cords under direct vision, positive ETCO2 and breath sounds checked- equal and bilateral Secured at: 21 cm Tube secured with: Tape Dental Injury: Teeth and Oropharynx as per pre-operative assessment

## 2023-02-03 NOTE — Anesthesia Postprocedure Evaluation (Signed)
 Anesthesia Post Note  Patient: Danielle Mullins  Procedure(s) Performed: DIAGNOSTIC LAPAROSCOPY  XI ROBOTIC ASSISTED TOTAL HYSTERECTOMY WITH BILATERAL SALPINGO OOPHORECTOMY, CYSTOSCOPY, MINI LAPAROTOMY (Bilateral) PELVIC LYMPH NODE DISSECTION     Patient location during evaluation: PACU Anesthesia Type: General Level of consciousness: sedated, patient cooperative and oriented Pain management: pain level controlled Vital Signs Assessment: post-procedure vital signs reviewed and stable Respiratory status: spontaneous breathing, nonlabored ventilation and respiratory function stable Cardiovascular status: blood pressure returned to baseline and stable Postop Assessment: no apparent nausea or vomiting and adequate PO intake Anesthetic complications: no   No notable events documented.  Last Vitals:  Vitals:   02/03/23 1811 02/03/23 1823  BP:  122/77  Pulse:  76  Resp:  13  Temp:  (!) 36.4 C  SpO2: 98% 98%    Last Pain:  Vitals:   02/03/23 1804  TempSrc:   PainSc: 3                  Delford Wingert,E. Marki Frede

## 2023-02-03 NOTE — Transfer of Care (Signed)
 Immediate Anesthesia Transfer of Care Note  Patient: Rojelio JONELLE Hamilton  Procedure(s) Performed: DIAGNOSTIC LAPAROSCOPY  XI ROBOTIC ASSISTED TOTAL HYSTERECTOMY WITH BILATERAL SALPINGO OOPHORECTOMY, CYSTOSCOPY, MINI LAPAROTOMY (Bilateral) PELVIC LYMPH NODE DISSECTION  Patient Location: PACU  Anesthesia Type:General  Level of Consciousness: drowsy  Airway & Oxygen Therapy: Patient Spontanous Breathing and Patient connected to face mask  Post-op Assessment: Report given to RN and Post -op Vital signs reviewed and stable  Post vital signs: Reviewed and stable  Last Vitals:  Vitals Value Taken Time  BP 153/78 02/03/23 1708  Temp 36.2 C 02/03/23 1709  Pulse 72 02/03/23 1709  Resp 17 02/03/23 1709  SpO2 100 % 02/03/23 1709  Vitals shown include unfiled device data.  Last Pain:  Vitals:   02/03/23 1116  TempSrc: Oral  PainSc:          Complications: No notable events documented.

## 2023-02-03 NOTE — Op Note (Signed)
 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 The complexity of this surgery increased the duration of the procedure by 75 minutes.   Surgeon: Viktoria Crank MD  Assistant Surgeon: Eleanor Epps, NP (an NP assistant was necessary for tissue manipulation, management of robotic instrumentation, retraction and positioning due to the complexity of the case and hospital policies).   Anesthesia: GET  Urine Output: 150 cc  Operative Findings: On EUA, large necrotic and foul smelling tumor was filling the vagina prolapsing through the cervix, which was completely dilated. On intra-abdominal entry, normal upper abdominal survey. Normal omentum, small and large bowel. Uterus 10 cm, very bulbous lower uterine segment and cervix. Normal appearing bilateral adnexa. No ascites. Upon delivery of the uterine specimen, significant necrosis noted of the large (8-10cm) prolapsing mass.  No clearly identifiable cervix, so additional cervical/vaginal margin taken after hysterectomy completed.  Partial ureterolysis performed given dilated cervix.  On cystoscopy after hysterectomy, bladder dome noted to be intact and good efflux seen from bilateral ureteral orifices.  Upon inspection of bilateral nodal basins, prominent left obturator lymph node.  Somewhat calcified appearing left internal iliac lymph node removed with prominent left external iliac lymph nodes.  Estimated Blood Loss:  100 cc      Total IV Fluids: see I&O flowsheet         Specimens: uterus, cervix, bilateral tubes and ovaries, pelvic washings, left obturator  LN, bilateral external iliac LNs         Complications:  None; patient tolerated the procedure well.         Disposition: PACU - hemodynamically stable.  Procedure Details  The patient was seen in the Holding Room. The risks, benefits, complications, treatment options, and expected outcomes were discussed with the patient.  The patient concurred with the proposed plan, giving informed consent.  The site of surgery properly noted/marked. The patient was identified as Danielle Mullins and the procedure verified as a Robotic-assisted versus open hysterectomy with bilateral salpingo oophorectomy and any other indicated procedures.   After induction of anesthesia, the patient was draped and prepped in the usual sterile manner. Patient was placed in supine position after anesthesia and draped and prepped in the usual sterile manner as follows: Her arms were tucked to her side with all appropriate precautions.  The shoulders were stabilized with padded shoulder blocks applied to the acromium processes.  The patient was placed in the semi-lithotomy position in [place] stirrups.  The perineum and vagina were prepped with CholoraPrep. The patient was draped after the CholoraPrep had been allowed to dry for 3 minutes.  A Time Out was held and the above information confirmed.  The urethra was prepped with Betadine . Foley catheter was placed.  OG tube placement was confirmed and to suction.   Next, a 10 mm skin incision was made 1 cm below the subcostal margin in the midclavicular line.  The 5 mm Optiview port and scope was used for direct entry.  Opening pressure was under 10 mm CO2.  The abdomen was insufflated and the findings were noted as above.   At this point and all points during the procedure, the patient's intra-abdominal pressure did not exceed 15 mmHg. Next, an 8 mm skin incision was made superior to the  umbilicus and a right and left port were placed about 8 cm lateral to the robot port on the right and left  side.  A fourth arm was placed on the right.  The 5 mm assist trocar was exchanged for a 10-12 mm port. All ports were placed under direct visualization.  The patient was placed in steep Trendelenburg.  Bowel was folded away into the upper abdomen.  The robot was docked in the normal manner.  The right and left peritoneum were opened parallel to the IP ligament to open the retroperitoneal spaces bilaterally. The round ligaments were transected.The ureter was noted to be on the medial leaf of the broad ligament.  The peritoneum above the ureter was incised and stretched and the infundibulopelvic ligament was skeletonized, cauterized and cut.  The posterior peritoneum was taken down.  During this dissection, given bulbous cervix, ureterolysis was performed down to the level of the uterine artery.  An EEA sizer was attempted to be placed posteriorly although this did not help identify the cervicovaginal junction.    Attention was then turned anteriorly.  The anterior peritoneum was also taken down.  The bladder flap was created to what appeared to be the level of the cervix.  An EEA sizer was then placed along the anterior aspect of the prolapsing mass within the vagina to help delineate how low the anterior dissection needed to be performed.  Once the bladder flap had been sufficiently developed, the uterine artery on the right side was skeletonized, cauterized and cut in the normal manner.  A similar procedure was performed on the left.  The colpotomy was started anteriorly on the EEA sizer.  The EEA sizer was then removed and the colpotomy was performed circumferentially.  Given concern that the uterus would not fit out through the vagina.  The uterine specimen and prolapsing vaginal mass were placed in an Endo Catch bag inserted through the assist trocar.  Pedicles were inspected and excellent hemostasis was achieved.    To assure a good vaginal margin, an additional margin was taken anteriorly and  posteriorly of the cervix/upper vagina.  These were delivered through the vagina.  Tumor within the vagina was cleared vaginally.  Colpotomy at the vaginal cuff was closed using 0 Vicryl with a figure-of-eight at each apex and 0 V-Loc to close the midportion of the cuff in a running manner in 2 layers.  Subsequently, the vagina was cleansed with Betadine  given necrotic tumor and some spillage of tumor.  This time the bladder was backfilled with 200 cc of sterile fluid.  Cystoscopy was performed with findings noted as above.  Foley catheter was then replaced.  Pelvic nodal basins were examined and decision made to perform selective bilateral pelvic lymphadenectomy.  Remove prominent or concerning lymph nodes, a pelvic lymph dissection was performed on the left with the following borders: proximally the bifurcation of the common iliac, distally the circumflex iliac vein, laterally the genitofemoral nerve, the medial border was the superior vesicle artery and the deep border was the obturator nerve. Lymphatic tissue was removed and sent to Pathology.  On the right, no prominent or enlarged nodes were noted within the obturator space.  Right external iliac lymph nodes were removed using similar borders.  These were then placed in Endo Catch bag and removed.    The pelvis was copiously irrigated.  Excellent hemostasis was assured.  At this point in the procedure was completed.  Robotic instruments were removed under direct visulaization.  The robot  was undocked. The fascia at the 10-12 mm port was closed with 0 Vicryl using a PMI fascial closure device under direct visualization.    The supraumbilical trocar was removed and the incision extended 10-12 cm with a scalpel. The incision was carried down to and through the fascia, with the abdomen insufflated, using monopolar electrocautery. The peritoneal incision was extended under direct visualization.  The incision was carried inferiorly to the umbilicus to  include the small umbilical hernia.  The endocatch bag with the uterine specimen was delivered through the incision.  The abdomen was then copiously irrigated again.  The incision was then closed with running #1 looped PDS tied in the midline. The subcutaneous tissue was irrigated and hemostasis achieved. Exparel  was injected for local anesthesia. The subcutaneous tissue was closed with 2-0 Vicyrl in running fashion.   The subcuticular tissue of all incisions was closed with 4-0 Vicryl and the skin was closed with 4-0 Monocryl in a subcuticular manner.  Dermabond was applied.    The vagina was swabbed with minimal bleeding noted.   All sponge, lap and needle counts were correct x  3.   The patient was transferred to the recovery room in stable condition.  Comer Dollar, MD

## 2023-02-04 ENCOUNTER — Encounter (HOSPITAL_COMMUNITY): Payer: Self-pay | Admitting: Gynecologic Oncology

## 2023-02-04 DIAGNOSIS — G4730 Sleep apnea, unspecified: Secondary | ICD-10-CM | POA: Diagnosis present

## 2023-02-04 DIAGNOSIS — Z886 Allergy status to analgesic agent status: Secondary | ICD-10-CM | POA: Diagnosis not present

## 2023-02-04 DIAGNOSIS — D62 Acute posthemorrhagic anemia: Secondary | ICD-10-CM | POA: Diagnosis not present

## 2023-02-04 DIAGNOSIS — E861 Hypovolemia: Secondary | ICD-10-CM | POA: Diagnosis present

## 2023-02-04 DIAGNOSIS — E785 Hyperlipidemia, unspecified: Secondary | ICD-10-CM | POA: Diagnosis present

## 2023-02-04 DIAGNOSIS — C775 Secondary and unspecified malignant neoplasm of intrapelvic lymph nodes: Secondary | ICD-10-CM | POA: Diagnosis present

## 2023-02-04 DIAGNOSIS — D509 Iron deficiency anemia, unspecified: Secondary | ICD-10-CM | POA: Diagnosis present

## 2023-02-04 DIAGNOSIS — C541 Malignant neoplasm of endometrium: Secondary | ICD-10-CM | POA: Diagnosis present

## 2023-02-04 DIAGNOSIS — N179 Acute kidney failure, unspecified: Secondary | ICD-10-CM | POA: Diagnosis not present

## 2023-02-04 DIAGNOSIS — Z8249 Family history of ischemic heart disease and other diseases of the circulatory system: Secondary | ICD-10-CM | POA: Diagnosis not present

## 2023-02-04 DIAGNOSIS — C55 Malignant neoplasm of uterus, part unspecified: Secondary | ICD-10-CM | POA: Diagnosis present

## 2023-02-04 DIAGNOSIS — Z79899 Other long term (current) drug therapy: Secondary | ICD-10-CM | POA: Diagnosis not present

## 2023-02-04 DIAGNOSIS — E669 Obesity, unspecified: Secondary | ICD-10-CM | POA: Diagnosis present

## 2023-02-04 DIAGNOSIS — Z7985 Long-term (current) use of injectable non-insulin antidiabetic drugs: Secondary | ICD-10-CM | POA: Diagnosis not present

## 2023-02-04 DIAGNOSIS — I959 Hypotension, unspecified: Secondary | ICD-10-CM | POA: Diagnosis not present

## 2023-02-04 DIAGNOSIS — I119 Hypertensive heart disease without heart failure: Secondary | ICD-10-CM | POA: Diagnosis present

## 2023-02-04 DIAGNOSIS — Z833 Family history of diabetes mellitus: Secondary | ICD-10-CM | POA: Diagnosis not present

## 2023-02-04 DIAGNOSIS — R141 Gas pain: Secondary | ICD-10-CM | POA: Diagnosis not present

## 2023-02-04 DIAGNOSIS — C548 Malignant neoplasm of overlapping sites of corpus uteri: Secondary | ICD-10-CM | POA: Diagnosis present

## 2023-02-04 DIAGNOSIS — Z7984 Long term (current) use of oral hypoglycemic drugs: Secondary | ICD-10-CM | POA: Diagnosis not present

## 2023-02-04 DIAGNOSIS — Z6830 Body mass index (BMI) 30.0-30.9, adult: Secondary | ICD-10-CM | POA: Diagnosis not present

## 2023-02-04 DIAGNOSIS — M25511 Pain in right shoulder: Secondary | ICD-10-CM | POA: Diagnosis not present

## 2023-02-04 DIAGNOSIS — E119 Type 2 diabetes mellitus without complications: Secondary | ICD-10-CM | POA: Diagnosis present

## 2023-02-04 LAB — GLUCOSE, CAPILLARY
Glucose-Capillary: 160 mg/dL — ABNORMAL HIGH (ref 70–99)
Glucose-Capillary: 160 mg/dL — ABNORMAL HIGH (ref 70–99)
Glucose-Capillary: 137 mg/dL — ABNORMAL HIGH (ref 70–99)
Glucose-Capillary: 168 mg/dL — ABNORMAL HIGH (ref 70–99)

## 2023-02-04 LAB — CBC
HCT: 24.7 % — ABNORMAL LOW (ref 36.0–46.0)
HCT: 27.4 % — ABNORMAL LOW (ref 36.0–46.0)
Hemoglobin: 7.7 g/dL — ABNORMAL LOW (ref 12.0–15.0)
Hemoglobin: 8.6 g/dL — ABNORMAL LOW (ref 12.0–15.0)
MCH: 23.8 pg — ABNORMAL LOW (ref 26.0–34.0)
MCH: 24.6 pg — ABNORMAL LOW (ref 26.0–34.0)
MCHC: 31.2 g/dL (ref 30.0–36.0)
MCHC: 31.4 g/dL (ref 30.0–36.0)
MCV: 76.5 fL — ABNORMAL LOW (ref 80.0–100.0)
MCV: 78.5 fL — ABNORMAL LOW (ref 80.0–100.0)
Platelets: 330 10*3/uL (ref 150–400)
Platelets: 337 10*3/uL (ref 150–400)
RBC: 3.23 MIL/uL — ABNORMAL LOW (ref 3.87–5.11)
RBC: 3.49 MIL/uL — ABNORMAL LOW (ref 3.87–5.11)
RDW: 13.8 % (ref 11.5–15.5)
RDW: 14.5 % (ref 11.5–15.5)
WBC: 7.5 10*3/uL (ref 4.0–10.5)
WBC: 7.4 10*3/uL (ref 4.0–10.5)
nRBC: 0 % (ref 0.0–0.2)
nRBC: 0 % (ref 0.0–0.2)

## 2023-02-04 LAB — BASIC METABOLIC PANEL
Anion gap: 10 (ref 5–15)
Anion gap: 11 (ref 5–15)
BUN: 21 mg/dL (ref 8–23)
BUN: 23 mg/dL (ref 8–23)
CO2: 23 mmol/L (ref 22–32)
CO2: 21 mmol/L — ABNORMAL LOW (ref 22–32)
Calcium: 8.3 mg/dL — ABNORMAL LOW (ref 8.9–10.3)
Calcium: 8 mg/dL — ABNORMAL LOW (ref 8.9–10.3)
Chloride: 98 mmol/L (ref 98–111)
Chloride: 101 mmol/L (ref 98–111)
Creatinine, Ser: 1.67 mg/dL — ABNORMAL HIGH (ref 0.44–1.00)
Creatinine, Ser: 1.78 mg/dL — ABNORMAL HIGH (ref 0.44–1.00)
GFR, Estimated: 34 mL/min — ABNORMAL LOW (ref >60)
GFR, Estimated: 32 mL/min — ABNORMAL LOW (ref >60)
Glucose, Bld: 194 mg/dL — ABNORMAL HIGH (ref 70–99)
Glucose, Bld: 148 mg/dL — ABNORMAL HIGH (ref 70–99)
Potassium: 4.6 mmol/L (ref 3.5–5.1)
Potassium: 3.6 mmol/L (ref 3.5–5.1)
Sodium: 131 mmol/L — ABNORMAL LOW (ref 135–145)
Sodium: 133 mmol/L — ABNORMAL LOW (ref 135–145)

## 2023-02-04 LAB — PREPARE RBC (CROSSMATCH)

## 2023-02-04 LAB — CREATININE, URINE, RANDOM: Creatinine, Urine: 363 mg/dL

## 2023-02-04 LAB — SODIUM, URINE, RANDOM: Sodium, Ur: <10 mmol/L

## 2023-02-04 MED ORDER — SODIUM CHLORIDE 0.9 % IV BOLUS
500.0000 mL | Freq: Once | INTRAVENOUS | Status: AC
Start: 2023-02-04 — End: 2023-02-04
  Administered 2023-02-04: 500 mL via INTRAVENOUS

## 2023-02-04 MED ORDER — CHLORHEXIDINE GLUCONATE CLOTH 2 % EX PADS
6.0000 | MEDICATED_PAD | Freq: Every day | CUTANEOUS | Status: DC
  Administered 2023-02-04: 6 via TOPICAL

## 2023-02-04 MED ORDER — SODIUM CHLORIDE 0.9% IV SOLUTION: Freq: Once | INTRAVENOUS | Status: AC

## 2023-02-04 MED ORDER — SODIUM CHLORIDE 0.9 % IV SOLN: INTRAVENOUS | Status: AC

## 2023-02-04 MED ORDER — MENTHOL 3 MG MT LOZG
1.0000 | LOZENGE | OROMUCOSAL | Status: DC | PRN
Start: 2023-02-04 — End: 2023-02-05
  Administered 2023-02-04 (×2): 3 mg via ORAL
  Filled 2023-02-04: qty 9

## 2023-02-04 MED ORDER — HEPARIN SODIUM (PORCINE) 5000 UNIT/ML IJ SOLN
5000.0000 [IU] | Freq: Three times a day (TID) | INTRAMUSCULAR | Status: DC
Start: 2023-02-05 — End: 2023-02-05
  Administered 2023-02-05: 5000 [IU] via SUBCUTANEOUS
  Filled 2023-02-04: qty 1

## 2023-02-04 NOTE — Progress Notes (Signed)
   02/04/23 1007  TOC Brief Assessment  Insurance and Status Reviewed  Patient has primary care physician Yes  Home environment has been reviewed home alone  Prior level of function: independent  Prior/Current Home Services No current home services  Social Drivers of Health Review SDOH reviewed no interventions necessary  Readmission risk has been reviewed Yes  Transition of care needs no transition of care needs at this time

## 2023-02-04 NOTE — Progress Notes (Signed)
 1 Day Post-Op Procedure(s) (LRB): DIAGNOSTIC LAPAROSCOPY  XI ROBOTIC ASSISTED TOTAL HYSTERECTOMY WITH BILATERAL SALPINGO OOPHORECTOMY, CYSTOSCOPY, MINI LAPAROTOMY (Bilateral) PELVIC LYMPH NODE DISSECTION (N/A)  Subjective: Patient reports doing well overnight.  She is tolerating sipping on liquids.  She has not had any solid food since surgery.  No nausea or emesis reported.  No flatus reported.  Abdominal incision with pain increases with movement.  While at rest this is manageable.  She dangled and ambulated in the halls last night.  Had dizziness when first getting up and shortness of breath at the end of her walk.  Denies chest pain or dyspnea at this time.    Objective: Vital signs in last 24 hours: Temp:  [97.2 F (36.2 C)-98 F (36.7 C)] 98 F (36.7 C) (01/03 0519) Pulse Rate:  [69-102] 70 (01/03 0519) Resp:  [13-17] 16 (01/03 0519) BP: (104-153)/(56-81) 104/56 (01/03 0519) SpO2:  [89 %-100 %] 98 % (01/03 0519) Weight:  [187 lb (84.8 kg)] 187 lb (84.8 kg) (01/02 1105)    Intake/Output from previous day: 01/02 0701 - 01/03 0700 In: 2471.7 [P.O.:360; I.V.:2011.7; IV Piggyback:100] Out: 625 [Urine:525; Blood:100]  Physical Examination: General: alert, cooperative, and no distress Resp: clear to auscultation bilaterally Cardio: regular rate and rhythm, S1, S2 normal, no murmur, click, rub or gallop GI: incision: abdomen laparoscopic incisions with dermabond intact along with midline abdominal mini lap with dermabond present, no active and abdomen is soft, active bowel sounds, non tender Extremities: extremities normal, atraumatic, no cyanosis or edema SCDs on. Foley with darker yellow urine  Labs: WBC/Hgb/Hct/Plts:  7.5/7.7/24.7/330 (01/03 9667) BUN/Cr/glu/ALT/AST/amyl/lip:  21/1.67/--/--/--/--/-- (01/03 9667)  Assessment: 63 y.o. s/p Procedure(s): DIAGNOSTIC LAPAROSCOPY  XI ROBOTIC ASSISTED TOTAL HYSTERECTOMY WITH BILATERAL SALPINGO OOPHORECTOMY, CYSTOSCOPY, MINI LAPAROTOMY,  PELVIC LYMPH NODE DISSECTION: stable Pain:  Pain is well-controlled on PRN medications.  Heme: Hgb 7.7 and Hct 24.7- symptomatic. Hx of acute blood loss related to chronic blood loss. Given symptomatic with shortness of breath when out of bed, increase in creatinine, low urine output, mild hypotension, need for chemotherapy in the near future, plan for transfusion on 1 unit PRBCs per Dr. Viktoria.  ID: WBC 7.5- no evidence of infection. Given decadron  and IV antibiotics intra-op.  CV: BP and HR overall stable. Mild hypotension at times. Continue to monitor while inpt.   GI:  Tolerating po: sips of water . Has ordered solid food for this am. Diet as tolerated. Antiemetics ordered prn.   GU: Creatinine 1.67 this am. Urinary output decreased. Plan for bolus and transfusion of 1 unit PRBCs this am. Plan on rechecking creatinine later today.    FEN: No critical values on am labs. Na+ at 131  Endo: Diabetes mellitus Type II, under good control.  CBG: CBG (last 3)  Recent Labs    02/03/23 1711 02/03/23 2212 02/04/23 0723  GLUCAP 177* 184* 160*     Prophylaxis: SCDs and lovenox  ordered.  Plan: NS 500 cc bolus now Transfusion of 1 unit PRBCs now Recheck labs 2 hours after completion of blood If labs improved and doing well, plan for possible discharge later this afternoon Encourage IS use, hydrating, being up with assist Continue plan of care The patient is to be discharged to home     LOS: 0 days    Danielle Mullins 02/04/2023, 8:04 AM

## 2023-02-04 NOTE — Progress Notes (Signed)
 GYN Oncology Progress Note  Patient is alert, oriented, resting in bed in no acute distress. She has not ambulated since earlier this am but denies dizziness and lightheadhess. Tolerated solid food for breakfast. Abdominal pain is still managed. 35 cc of concentrated yellow urine in the foley. Given low output, foley flushed to assess patency. Foley flushed with 30 cc of sterile saline without difficulty. Pt tolerated well. Awaiting blood transfusion with repeat labs 2 hours after. Plan for possible discharge home later today but may be subject to change given time of day with several interventions needed prior to discharge (blood transfusion, labs).

## 2023-02-04 NOTE — Progress Notes (Signed)
 GYN Oncology Progress Note  Patient is alert, oriented, in no acute distress, resting in bed with RN at bedside. S/P transfusion of 1 unit PRBCs. Due to labs and urine lytes around 5:30-6 pm. She reports having a good day. She has had 1 and 1/2 cups of water  today and a few sips of juice. Recently ambulated in the hall and tolerated this well. Foley with around 75 cc concentrated yellow urine.   Will assess upcoming lab work. Plan for patient staying overnight with IVF and am labs. IVF to be adjusted based on labs. Continue plan of care per Dr. Viktoria.

## 2023-02-04 NOTE — Plan of Care (Signed)
   Problem: Coping: Goal: Level of anxiety will decrease Outcome: Progressing   Problem: Pain Management: Goal: General experience of comfort will improve Outcome: Progressing   Problem: Safety: Goal: Ability to remain free from injury will improve Outcome: Progressing

## 2023-02-04 NOTE — Plan of Care (Signed)
  Problem: Clinical Measurements: Goal: Diagnostic test results will improve Outcome: Progressing   Problem: Coping: Goal: Level of anxiety will decrease Outcome: Progressing   Problem: Skin Integrity: Goal: Risk for impaired skin integrity will decrease Outcome: Progressing   Problem: Tissue Perfusion: Goal: Adequacy of tissue perfusion will improve Outcome: Progressing

## 2023-02-04 NOTE — Plan of Care (Signed)
 Problem: Education: Goal: Knowledge of General Education information will improve Description: Including pain rating scale, medication(s)/side effects and non-pharmacologic comfort measures Outcome: Progressing   Problem: Clinical Measurements: Goal: Ability to maintain clinical measurements within normal limits will improve Outcome: Progressing   Problem: Activity: Goal: Risk for activity intolerance will decrease Outcome: Progressing   Problem: Coping: Goal: Level of anxiety will decrease Outcome: Progressing   Problem: Elimination: Goal: Will not experience complications related to bowel motility Outcome: Progressing   Problem: Safety: Goal: Ability to remain free from injury will improve Outcome: Progressing   Jon LULLA Reins, RN 02/04/23 4:52 PM

## 2023-02-05 LAB — CBC
HCT: 25.6 % — ABNORMAL LOW (ref 36.0–46.0)
Hemoglobin: 8.3 g/dL — ABNORMAL LOW (ref 12.0–15.0)
MCH: 25.2 pg — ABNORMAL LOW (ref 26.0–34.0)
MCHC: 32.4 g/dL (ref 30.0–36.0)
MCV: 77.8 fL — ABNORMAL LOW (ref 80.0–100.0)
Platelets: 325 10*3/uL (ref 150–400)
RBC: 3.29 MIL/uL — ABNORMAL LOW (ref 3.87–5.11)
RDW: 14.6 % (ref 11.5–15.5)
WBC: 6.6 10*3/uL (ref 4.0–10.5)
nRBC: 0 % (ref 0.0–0.2)

## 2023-02-05 LAB — BASIC METABOLIC PANEL
Anion gap: 10 (ref 5–15)
Anion gap: 10 (ref 5–15)
BUN: 22 mg/dL (ref 8–23)
BUN: 18 mg/dL (ref 8–23)
CO2: 22 mmol/L (ref 22–32)
CO2: 20 mmol/L — ABNORMAL LOW (ref 22–32)
Calcium: 7.8 mg/dL — ABNORMAL LOW (ref 8.9–10.3)
Calcium: 8.2 mg/dL — ABNORMAL LOW (ref 8.9–10.3)
Chloride: 104 mmol/L (ref 98–111)
Chloride: 107 mmol/L (ref 98–111)
Creatinine, Ser: 1.39 mg/dL — ABNORMAL HIGH (ref 0.44–1.00)
Creatinine, Ser: 0.95 mg/dL (ref 0.44–1.00)
GFR, Estimated: 43 mL/min — ABNORMAL LOW (ref >60)
GFR, Estimated: >60 mL/min (ref >60)
Glucose, Bld: 118 mg/dL — ABNORMAL HIGH (ref 70–99)
Glucose, Bld: 116 mg/dL — ABNORMAL HIGH (ref 70–99)
Potassium: 3.5 mmol/L (ref 3.5–5.1)
Potassium: 3.6 mmol/L (ref 3.5–5.1)
Sodium: 136 mmol/L (ref 135–145)
Sodium: 137 mmol/L (ref 135–145)

## 2023-02-05 LAB — GLUCOSE, CAPILLARY
Glucose-Capillary: 120 mg/dL — ABNORMAL HIGH (ref 70–99)
Glucose-Capillary: 123 mg/dL — ABNORMAL HIGH (ref 70–99)
Glucose-Capillary: 106 mg/dL — ABNORMAL HIGH (ref 70–99)

## 2023-02-05 MED ORDER — SIMETHICONE 80 MG PO CHEW: 80.0000 mg | CHEWABLE_TABLET | Freq: Four times a day (QID) | ORAL | 0 refills | Status: DC

## 2023-02-05 MED ORDER — ONDANSETRON HCL 4 MG PO TABS: 4.0000 mg | ORAL_TABLET | Freq: Four times a day (QID) | ORAL | 0 refills | Status: DC | PRN

## 2023-02-05 MED ORDER — SIMETHICONE 80 MG PO CHEW
80.0000 mg | CHEWABLE_TABLET | Freq: Four times a day (QID) | ORAL | Status: DC
Start: 2023-02-05 — End: 2023-02-05
  Administered 2023-02-05 (×3): 80 mg via ORAL
  Filled 2023-02-05 (×3): qty 1

## 2023-02-05 MED ORDER — OXYCODONE HCL 5 MG PO TABS: 5.0000 mg | ORAL_TABLET | ORAL | 0 refills | Status: DC | PRN

## 2023-02-05 NOTE — Plan of Care (Signed)
   Problem: Education: Goal: Knowledge of General Education information will improve Description Including pain rating scale, medication(s)/side effects and non-pharmacologic comfort measures Outcome: Progressing   Problem: Activity: Goal: Risk for activity intolerance will decrease Outcome: Progressing   Problem: Safety: Goal: Ability to remain free from injury will improve Outcome: Progressing

## 2023-02-05 NOTE — Discharge Summary (Signed)
 Physician Discharge Summary  Patient ID: Danielle Mullins MRN: [id number] DOB/AGE: Mar 22, 1960 63 y.o.  Admit date: 02/03/2023 Discharge date: 02/05/2023  Admission Diagnoses: Endometrial cancer [facility])  Discharge Diagnoses:  Principal Problem:   Endometrial cancer [facility]) Active Problems:   Uterine cancer [facility])   Discharged Condition:  The patient is in good condition and stable for discharge.    Hospital Course: The patient was admitted on 1/2 for surgery in the setting of a large prolapsing uterine mass with biopsy showing leiomyosarcoma, can't rule out carcinosarcoma. On 1/2, she underwent robotic-assisted laparoscopic total hysterectomy with bilateral salpingoophorectomy, bilateral pelvic lymph node dissection, mini laparotomy for specimen delivery, cystoscopy. Postoperatively, she was noted to have acute on chronic anemia requiring transfusion with 1u of pRBCs. She also was noted to have an AKI, suspected to be secondary to poor oral intake prior to surgery. This was treated with IVFs and strict I&Os. Her creatinine returned to normal on POD#2 and she was endorsing improved PO hydration. On the day of discharge, she was reporting good pain control, endorsing flatus, voiding spontaneously, tolerating PO, and ambulating well.  She is discharged to home in stable condition.  Consults: None  Significant Diagnostic Studies:     Latest Ref Rng & Units 02/05/2023    4:22 AM 02/04/2023    6:09 PM 02/04/2023    3:32 AM  CBC  WBC 4.0 - 10.5 K/uL 6.6  7.4  7.5   Hemoglobin 12.0 - 15.0 g/dL 8.3  8.6  7.7   Hematocrit 36.0 - 46.0 % 25.6  27.4  24.7   Platelets 150 - 400 K/uL 325  337  330       Latest Ref Rng & Units 02/05/2023    1:55 PM 02/05/2023    4:22 AM 02/04/2023    6:09 PM  BMP  Glucose 70 - 99 mg/dL 883  881  851   BUN 8 - 23 mg/dL 18  22  23    Creatinine 0.44 - 1.00 mg/dL 9.04  8.60  8.21   Sodium 135 - 145 mmol/L 137  136  133   Potassium 3.5 - 5.1 mmol/L 3.6  3.5  3.6   Chloride 98 -  111 mmol/L 107  104  101   CO2 22 - 32 mmol/L 20  22  21    Calcium 8.9 - 10.3 mg/dL 8.2  7.8  8.0    Treatments: surgery, IVFs  Discharge Exam: Blood pressure (!) 140/71, pulse 79, temperature 98.3 F (36.8 C), resp. rate 17, height 5' 6 (1.676 m), weight 186 lb 15.2 oz (84.8 kg), SpO2 96%. Exam from AM progress note Gen: NAD, alert, oriented CV: regular rate and rhythm, no murmurs or rubs Pulmonary: lungs clear to auscultation bilaterally, no wheezes or rhonchi Abdomen: mildly distended, hypoactive BS, soft, moderately tender to palpation, incisions are clean, dry and intact Ext: warm and well perfused, no edema  Disposition: Discharge disposition: 01-Home or Self Care        Allergies as of 02/05/2023       Reactions   Celecoxib Swelling        Medication List     PAUSE taking these medications    metFORMIN  500 MG tablet Wait to take this until your doctor or other care provider tells you to start again. Commonly known as: GLUCOPHAGE  Take 500 mg by mouth 2 (two) times daily with a meal.   Rybelsus  7 MG Tabs Wait to take this until your doctor or other care provider  tells you to start again. Generic drug: Semaglutide  Take 1 tablet by mouth daily.       STOP taking these medications    metroNIDAZOLE  500 MG tablet Commonly known as: FLAGYL        TAKE these medications    ezetimibe  10 MG tablet Commonly known as: ZETIA  Take 1 tablet (10 mg total) by mouth daily.   FeroSul 325 (65 Fe) MG tablet Generic drug: ferrous sulfate  Take 1 tablet (325 mg total) by mouth daily with breakfast.   gabapentin  100 MG capsule Commonly known as: NEURONTIN  Take 100 mg by mouth at bedtime as needed (nerve pain).   ondansetron  4 MG tablet Commonly known as: ZOFRAN  Take 1 tablet (4 mg total) by mouth every 6 (six) hours as needed for nausea.   oxyCODONE  5 MG immediate release tablet Commonly known as: Roxicodone  Take 1 tablet (5 mg total) by mouth every 4 (four)  hours as needed for severe pain (pain score 7-10).   senna-docusate 8.6-50 MG tablet Commonly known as: Senokot-S Take 2 tablets by mouth at bedtime. For AFTER surgery, do not take if having diarrhea   simethicone  80 MG chewable tablet Commonly known as: MYLICON Chew 1 tablet (80 mg total) by mouth 4 (four) times daily.   simvastatin  40 MG tablet Commonly known as: ZOCOR  Take 40 mg by mouth at bedtime.        Follow-up Information     Viktoria Comer SAUNDERS, MD Follow up on 02/10/2023.   Specialty: Gynecologic Oncology Why: around 4:45pm will be a PHONE visit with Dr. Viktoria to check in. IN PERSON visit will be on 02/24/2023 at 8:30 am at the [facility]. Contact information: [address] [place] [address] [phone]                 Greater than thirty minutes were spend for face to face discharge instructions and discharge orders/summary in EPIC.   Signed: Comer SAUNDERS Viktoria 02/05/2023, 5:59 PM

## 2023-02-05 NOTE — Progress Notes (Addendum)
 2 Days Post-Op Procedure(s) (LRB): DIAGNOSTIC LAPAROSCOPY  XI ROBOTIC ASSISTED TOTAL HYSTERECTOMY WITH BILATERAL SALPINGO OOPHORECTOMY, CYSTOSCOPY, MINI LAPAROTOMY (Bilateral) PELVIC LYMPH NODE DISSECTION (N/A)  Subjective: Patient reports having a rough night. Notes lots of gas pain, pain in right shoulder. Walked overnight. Had some mild nausea last night, denies emesis. Denies flatus. Unsure why not drinking more liquids. Denies any vaginal bleeding.   Objective: Vital signs in last 24 hours: Temp:  [97.7 F (36.5 C)-98.8 F (37.1 C)] 98.5 F (36.9 C) (01/04 0543) Pulse Rate:  [63-79] 78 (01/04 0543) Resp:  [17-20] 18 (01/04 0543) BP: (99-124)/(61-77) 124/72 (01/04 0543) SpO2:  [95 %-100 %] 97 % (01/04 0543) Weight:  [186 lb 15.2 oz (84.8 kg)] 186 lb 15.2 oz (84.8 kg) (01/03 2100) Last BM Date : 02/01/23  Intake/Output from previous day: 01/03 0701 - 01/04 0700 In: 2794.9 [P.O.:960; I.V.:1434.9; Blood:400] Out: 455 [Urine:455]  Physical Examination: Gen: NAD, alert, oriented CV: regular rate and rhythm, no murmurs or rubs Pulmonary: lungs clear to auscultation bilaterally, no wheezes or rhonchi Abdomen: mildly distended, hypoactive BS, soft, moderately tender to palpation, incisions are clean, dry and intact Ext: warm and well perfused, no edema GU: foley catheter in place with 325 cc of concentrated appearing urine (foley catheter removed at 07:50) - urine in tubing less concentrated  Labs:    Latest Ref Rng & Units 02/05/2023    4:22 AM 02/04/2023    6:09 PM 02/04/2023    3:32 AM  CBC  WBC 4.0 - 10.5 K/uL 6.6  7.4  7.5   Hemoglobin 12.0 - 15.0 g/dL 8.3  8.6  7.7   Hematocrit 36.0 - 46.0 % 25.6  27.4  24.7   Platelets 150 - 400 K/uL 325  337  330       Latest Ref Rng & Units 02/05/2023    4:22 AM 02/04/2023    6:09 PM 02/04/2023    3:32 AM  BMP  Glucose 70 - 99 mg/dL 881  851  805   BUN 8 - 23 mg/dL 22  23  21    Creatinine 0.44 - 1.00 mg/dL 8.60  8.21  8.32   Sodium  135 - 145 mmol/L 136  133  131   Potassium 3.5 - 5.1 mmol/L 3.5  3.6  4.6   Chloride 98 - 111 mmol/L 104  101  98   CO2 22 - 32 mmol/L 22  21  23    Calcium 8.9 - 10.3 mg/dL 7.8  8.0  8.3    Assessment:  63 y.o. s/p Procedure(s): DIAGNOSTIC LAPAROSCOPY  XI ROBOTIC ASSISTED TOTAL HYSTERECTOMY WITH BILATERAL SALPINGO OOPHORECTOMY, CYSTOSCOPY, MINI LAPAROTOMY PELVIC LYMPH NODE DISSECTION: progressing well  Post-op: meeting most milestones. Hypoactive BS this am without return of bowel function. Increased gas pain. Simethicone  order changed to q6 hours scheduled, encouraged ambulation. IS at bedside. Binder ordered yesterday, message sent to RN re getting binder this morning.  JXP:Dldezru related to hypovolemia pre-op (patient endorses poor PO intake prior to surgery). Downtrending Cr with increased IVF overnight. UOP low overnight although another 325cc emptied from foley catheter this morning. Foley catheter removed with plan for continued strict I&Os (hat placed for UOP monitoring). Encouraged increased PO hydration.   Acute on chronic anemia: in the setting of iron  deficiency anemia. Expected drop in Hgb post-op. Appropriate rise after 1u pRBC on 1/3 for symptomatic anemia (shortness of breath with ambulation). Small decrease in Hgb this morning consistent with hemodilution. Asymptomatic this am. Continue to monitor.  Hypertension: not on outpatient medications. Normotensive in postoperative period.  Diabetes: on metformin  and semaglutide  outpatient. CBGs 120-160 over last 24 hours, continue sliding scale until improved PO intake.   Prophylaxis: SCDs, ambulation, heparin . Lovenox  held yesterday given AKI.   Plan: Awaiting return of bowel function, recheck labs early afternoon. If + flatus and continue improvement in kidney function, consider PM discharge.    LOS: 1 day    Danielle Mullins 02/05/2023, 7:37 AM

## 2023-02-07 ENCOUNTER — Telehealth: Payer: Self-pay | Admitting: *Deleted

## 2023-02-07 LAB — BPAM RBC
Blood Product Expiration Date: 202501292359
Blood Product Expiration Date: 202501292359
ISSUE DATE / TIME: 202501031258
ISSUING PHYSICIAN: 202501031258
Unit Type and Rh: 202501292359
Unit Type and Rh: 5100
Unit Type and Rh: 5100

## 2023-02-07 LAB — TYPE AND SCREEN
ABO/RH(D): O POS
Antibody Screen: NEGATIVE
Unit division: 0
Unit division: 0

## 2023-02-07 LAB — CYTOLOGY - NON PAP

## 2023-02-07 NOTE — Telephone Encounter (Signed)
 Spoke with Ms. Lewallen this morning. She states she is eating, drinking and urinating well. She has had a BM and is passing gas. She is taking senokot as prescribed and encouraged her to drink plenty of water . She denies fever or chills. Incisions are dry and intact. She rates her pain 4/10. Her pain is controlled with oxycodone  which she is only taking at night, pt states she is not taking anything during the day for pain.     Instructed to call office with any fever, chills, purulent drainage, uncontrolled pain or any other questions or concerns. Patient verbalizes understanding.   Pt aware of post op appointments as well as the office number [phone] and after hours number [phone] to call if she has any questions or concerns

## 2023-02-08 LAB — SURGICAL PATHOLOGY

## 2023-02-10 ENCOUNTER — Inpatient Hospital Stay: Payer: BC Managed Care – PPO | Attending: Gynecologic Oncology | Admitting: Gynecologic Oncology

## 2023-02-10 ENCOUNTER — Encounter: Payer: Self-pay | Admitting: Gynecologic Oncology

## 2023-02-10 DIAGNOSIS — Z90710 Acquired absence of both cervix and uterus: Secondary | ICD-10-CM | POA: Insufficient documentation

## 2023-02-10 DIAGNOSIS — C549 Malignant neoplasm of corpus uteri, unspecified: Secondary | ICD-10-CM | POA: Insufficient documentation

## 2023-02-10 DIAGNOSIS — C775 Secondary and unspecified malignant neoplasm of intrapelvic lymph nodes: Secondary | ICD-10-CM | POA: Insufficient documentation

## 2023-02-10 DIAGNOSIS — Z90722 Acquired absence of ovaries, bilateral: Secondary | ICD-10-CM | POA: Insufficient documentation

## 2023-02-10 DIAGNOSIS — Z9079 Acquired absence of other genital organ(s): Secondary | ICD-10-CM | POA: Insufficient documentation

## 2023-02-10 DIAGNOSIS — C541 Malignant neoplasm of endometrium: Secondary | ICD-10-CM

## 2023-02-10 DIAGNOSIS — C7963 Secondary malignant neoplasm of bilateral ovaries: Secondary | ICD-10-CM | POA: Insufficient documentation

## 2023-02-10 DIAGNOSIS — Z7189 Other specified counseling: Secondary | ICD-10-CM

## 2023-02-10 NOTE — Progress Notes (Signed)
 Gynecologic Oncology Telehealth Note: Gyn-Onc  I connected with Danielle Mullins on 02/10/23 at  4:45 PM EST by telephone and verified that I am speaking with the correct person using two identifiers.  I discussed the limitations, risks, security and privacy concerns of performing an evaluation and management service by telemedicine and the availability of in-person appointments. I also discussed with the patient that there may be a patient responsible charge related to this service. The patient expressed understanding and agreed to proceed.  Other persons participating in the visit and their role in the encounter: none.  Patient's location: home Provider's location: [facility]  Reason for Visit: follow-up  Treatment History: Oncology History Overview Note  Presented initially with PMB/discharge starting in 11/2022. After discharge became purulent in appearance, she presented to ED and was admitted from 12/11-12/13/24. Mass was noted superiorly within the vagina, necrotic in appearance. Treatment for PID was recommended.    Endometrial cancer (HCC)  12/14/2022 Imaging   Pelvic ultrasound:  1. Enlarged and heterogeneous postmenopausal uterus with multiple fibroids. 2. Echogenic 2.4 cm ovoid lesion within the mid uterus. This may represent a fibroid versus endometrial pathology as the endometrium is not identified on today's exam. Multiphase MRI of the pelvis could be obtained for further evaluation, as clinically indicated. 3.  Nonvisualization of the bilateral ovaries.   01/11/2023 Imaging   Pelvic ultrasound: 1. 7.8 cm hypervascular mass within the lower uterine segment, extending toward the cervix, with evidence of a vascular stalk on Doppler imaging. Differential would include endometrial mass such as polyp versus pedunculated submucosal fibroid. Dedicated nonemergent pelvic MRI with and without contrast is recommended. 2. Nonvisualization of the ovaries and endometrium   01/11/2023 Initial  Biopsy   Biopsy of cervical mass - Malignant smooth muscle tumor with necrosis, consistent with leiomyosarcoma, see comment  Differential diagnosis can include a carcinosarcoma.     01/13/2023 Imaging   MRI pelvis: 12.6 cm endocervical mass, extending to the uterine fundus, as above. Myometrial invasion involving the anterior lower uterine segment. No parametrial/serosal extension. Correlate with pending biopsy. Mildly prominent bilateral pelvic/inguinal nodes, indeterminate but suspicious for small nodal metastases. Small volume pelvic ascites.   01/20/2023 Initial Diagnosis   Endometrial cancer (HCC)   02/03/2023 Imaging   CT C/A: 1. Multiple small bilateral pulmonary nodules measuring up to 0.4 cm, nonspecific. These may be infectious or inflammatory although warrant close attention on follow-up as early pulmonary metastatic disease is not excluded. 2. No evidence of lymphadenopathy or metastatic disease in the abdomen. 3. Hepatic steatosis.   02/03/2023 Surgery   Robotic-assisted laparoscopic total hysterectomy with bilateral salpingoophorectomy, bilateral pelvic lymph node dissection, mini laparotomy for specimen delivery, cystoscopy Large dilated cervix secondary to prolapsing mass requiring additional OR time and partial ureterolysis. The complexity of this surgery increased the duration of the procedure by 75 minutes.  Findings: On EUA, large necrotic and foul smelling tumor was filling the vagina prolapsing through the cervix, which was completely dilated. On intra-abdominal entry, normal upper abdominal survey. Normal omentum, small and large bowel. Uterus 10 cm, very bulbous lower uterine segment and cervix. Normal appearing bilateral adnexa. No ascites. Upon delivery of the uterine specimen, significant necrosis noted of the large (8-10cm) prolapsing mass.  No clearly identifiable cervix, so additional cervical/vaginal margin taken after hysterectomy completed.  Partial  ureterolysis performed given dilated cervix.  On cystoscopy after hysterectomy, bladder dome noted to be intact and good efflux seen from bilateral ureteral orifices.  Upon inspection of bilateral nodal basins, prominent  left obturator lymph node.  Somewhat calcified appearing left internal iliac lymph node removed with prominent left external iliac lymph nodes.    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 Feeling better each day. Not using pain medicine. Taste still feels off. Bowels are a loose. Had been using Mirilax.  Drinking lots of water , voiding without issues. Moving around well.   Past Medical/Surgical History: Past Medical History:  Diagnosis Date   Arthritis    Diabetes mellitus without complication (HCC)    Fibroids    Heart disease, unspecified e   enlarged heart chamber- patient has referral   Hypertension    Ingrown toenail 01/13/2015   Mobitz type 2 second degree AV block 03/24/2020   Plantar fasciitis 01/13/2015   Porokeratosis 01/13/2015   Sesamoiditis 01/13/2015   Sleep apnea    Symptomatic bradycardia 03/24/2020    Past Surgical History:  Procedure Laterality Date   LYMPH NODE DISSECTION N/A 02/03/2023   Procedure: PELVIC LYMPH NODE DISSECTION;  Surgeon: Viktoria Comer SAUNDERS, MD;  Location: [facility];  Service: Gynecology;  Laterality: N/A;   ROBOTIC ASSISTED TOTAL HYSTERECTOMY WITH BILATERAL SALPINGO OOPHERECTOMY Bilateral 02/03/2023   Procedure: DIAGNOSTIC LAPAROSCOPY  XI ROBOTIC ASSISTED TOTAL HYSTERECTOMY WITH BILATERAL SALPINGO OOPHORECTOMY, CYSTOSCOPY, MINI LAPAROTOMY;  Surgeon: Viktoria Comer SAUNDERS, MD;  Location: [facility];  Service: Gynecology;   Laterality: Bilateral;    Family History  Problem Relation Age of Onset   Diabetes Mother    Hypertension Mother    Heart disease Father    Hypertension Father    Diabetes Brother    Breast cancer Neg Hx    Prostate cancer Neg Hx    Colon cancer Neg Hx    Endometrial cancer Neg Hx    Ovarian cancer Neg Hx    Pancreatic cancer Neg Hx    Cancer - Ovarian Neg Hx     Social History   Socioeconomic History   Marital status: Divorced    Spouse name: Not on file   Number of children: 0   Years of education: Not on file   Highest education level: Not on file  Occupational History   Not on file  Tobacco Use   Smoking status: Never   Smokeless tobacco: Never  Vaping Use   Vaping status: Never Used  Substance and Sexual Activity   Alcohol  use: No    Alcohol /week: 0.0 standard drinks of alcohol    Drug use: No   Sexual activity: Not Currently  Other Topics Concern   Not on file  Social History Narrative   Not on file   Social Drivers of Health   Financial Resource Strain: Not on file  Food Insecurity: Patient Declined (02/03/2023)   Hunger Vital Sign    Worried About Running Out of Food in the Last Year: Patient declined    Ran Out of Food in the Last Year: Patient declined  Transportation Needs: Unknown (02/03/2023)   PRAPARE - Administrator, Civil Service (Medical): Patient unable to answer    Lack of Transportation (Non-Medical): Patient declined  Physical Activity: Not on file  Stress: Not on file  Social Connections: Not on file    Current Medications:  Current Outpatient Medications:  ezetimibe  (ZETIA ) 10 MG tablet, Take 1 tablet (10 mg total) by mouth daily., Disp: 90 tablet, Rfl: 0   ferrous sulfate  325 (65 FE) MG tablet, Take 1 tablet (325 mg total) by mouth daily with breakfast., Disp: 60 tablet, Rfl: 0   gabapentin  (NEURONTIN ) 100 MG capsule, Take 100 mg by mouth at bedtime as needed (nerve pain)., Disp: , Rfl:    [Paused] metFORMIN   (GLUCOPHAGE ) 500 MG tablet, Take 500 mg by mouth 2 (two) times daily with a meal., Disp: , Rfl:    ondansetron  (ZOFRAN ) 4 MG tablet, Take 1 tablet (4 mg total) by mouth every 6 (six) hours as needed for nausea., Disp: 10 tablet, Rfl: 0   oxyCODONE  (ROXICODONE ) 5 MG immediate release tablet, Take 1 tablet (5 mg total) by mouth every 4 (four) hours as needed for severe pain (pain score 7-10)., Disp: 15 tablet, Rfl: 0   [Paused] RYBELSUS  7 MG TABS, Take 1 tablet by mouth daily., Disp: , Rfl:    senna-docusate (SENOKOT-S) 8.6-50 MG tablet, Take 2 tablets by mouth at bedtime. For AFTER surgery, do not take if having diarrhea, Disp: 30 tablet, Rfl: 0   simethicone  (MYLICON) 80 MG chewable tablet, Chew 1 tablet (80 mg total) by mouth 4 (four) times daily., Disp: 30 tablet, Rfl: 0   simvastatin  (ZOCOR ) 40 MG tablet, Take 40 mg by mouth at bedtime., Disp: , Rfl:   Review of Symptoms: Pertinent positives as per HPI.  Physical Exam: Deferred given limitations of phone visit.  Laboratory & Radiologic Studies: None new  Assessment & Plan: Danielle Mullins is a 63 y.o. 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  Patient is overall doing well, meeting postoperative milestones.  Discussed loose stools and suggested she stop the MiraLAX .  I asked her to call if bowel function does not normalize.  We reviewed pathology from surgery which shows large carcinosarcoma that was metastatic to the surface of both ovaries and multiple pelvic lymph nodes.  P53 appears to be mixed staining with some wild-type and some abnormal.  Plan for molecular testing.  Discussed recommendation for systemic chemotherapy with likely addition of immunotherapy and vaginal brachytherapy.  Message sent to our nurse navigator to help facilitate scheduling the patient to see Dr. Lonn and Dr. Shannon.  I discussed the assessment and treatment plan with the patient. The patient was  provided with an opportunity to ask questions and all were answered. The patient agreed with the plan and demonstrated an understanding of the instructions.   The patient was advised to call back or see an in-person evaluation if the symptoms worsen or if the condition fails to improve as anticipated.   8 minutes of total time was spent for this patient encounter, including preparation, phone counseling with the patient and coordination of care, and documentation of the encounter.   Comer Dollar, MD  Division of Gynecologic Oncology  Department of Obstetrics and Gynecology  [facility]

## 2023-02-11 ENCOUNTER — Encounter: Payer: Self-pay | Admitting: Oncology

## 2023-02-11 ENCOUNTER — Telehealth: Payer: Self-pay | Admitting: Oncology

## 2023-02-11 ENCOUNTER — Telehealth: Payer: Self-pay | Admitting: *Deleted

## 2023-02-11 DIAGNOSIS — C549 Malignant neoplasm of corpus uteri, unspecified: Secondary | ICD-10-CM

## 2023-02-11 NOTE — Progress Notes (Signed)
 Requested MI Tumor Seek Hybrid testing from [facility] on accession [id number] per Dr. Pricilla Holm.

## 2023-02-11 NOTE — Progress Notes (Signed)
 Referral to Radiation Oncology placed per Dr. Pricilla Holm.

## 2023-02-11 NOTE — Telephone Encounter (Signed)
 Spoke with Ms. Meriweather and she gave verbal consent for Dr. Pricilla Holm to speak with her brother Lindaann Pascal in regards to update on her health.

## 2023-02-11 NOTE — Telephone Encounter (Addendum)
 Patient's brother Dr. Lindaann Pascal called and left a message for Dr. Pricilla Holm. He would like to be updated on pt's condition. His call back number [phone]. Message forwarded to provider.

## 2023-02-11 NOTE — Telephone Encounter (Signed)
 [facility] and scheduled a new patient appointment with Dr. Bertis Ruddy on 02/18/23 at 2:00 with 1:30 arrival.  Discussed that the radiation oncology scheduler will be calling her with an appointment to see Dr. Roselind Messier.  She verbalized understanding.

## 2023-02-14 ENCOUNTER — Other Ambulatory Visit: Payer: Self-pay | Admitting: Oncology

## 2023-02-14 ENCOUNTER — Telehealth: Payer: Self-pay | Admitting: Gynecologic Oncology

## 2023-02-14 NOTE — Progress Notes (Signed)
 Gynecologic Oncology Multi-Disciplinary Disposition Conference Note  Date of the Conference: 02/14/2023  Patient Name: Danielle Mullins  Referring Provider: Dr. Jillian Primary GYN Oncologist: Dr. Viktoria   Stage/Disposition:  At least stage IIIC1 carcinosarcoma of the uterus. Disposition is to adjuvant chemotherapy with consideration for immunotherapy and vaginal brachytherapy.   This Multidisciplinary conference took place involving physicians from Gynecologic Oncology, Medical Oncology, Radiation Oncology, Pathology, Radiology along with the Gynecologic Oncology Nurse Practitioner and Gynecologic Oncology Nurse Navigator.  Comprehensive assessment of the patient's malignancy, staging, need for surgery, chemotherapy, radiation therapy, and need for further testing were reviewed. Supportive measures, both inpatient and following discharge were also discussed. The recommended plan of care is documented. Greater than 35 minutes were spent correlating and coordinating this patient's care.

## 2023-02-14 NOTE — Telephone Encounter (Signed)
 Called patient's brother per her request. Discussed surgery findings and pathology. Discussed recommendations for adjuvant therapy, side effects. All questions answered.  Eugene Garnet MD Gynecologic Oncology

## 2023-02-18 ENCOUNTER — Encounter: Payer: Self-pay | Admitting: Hematology and Oncology

## 2023-02-18 ENCOUNTER — Inpatient Hospital Stay (HOSPITAL_BASED_OUTPATIENT_CLINIC_OR_DEPARTMENT_OTHER): Payer: BC Managed Care – PPO | Admitting: Hematology and Oncology

## 2023-02-18 VITALS — BP 136/71 | HR 86 | Temp 97.7°F | Resp 16 | Wt 185.8 lb

## 2023-02-18 DIAGNOSIS — C775 Secondary and unspecified malignant neoplasm of intrapelvic lymph nodes: Secondary | ICD-10-CM | POA: Diagnosis not present

## 2023-02-18 DIAGNOSIS — Z9079 Acquired absence of other genital organ(s): Secondary | ICD-10-CM | POA: Diagnosis not present

## 2023-02-18 DIAGNOSIS — Z7189 Other specified counseling: Secondary | ICD-10-CM | POA: Diagnosis not present

## 2023-02-18 DIAGNOSIS — C541 Malignant neoplasm of endometrium: Secondary | ICD-10-CM | POA: Diagnosis not present

## 2023-02-18 DIAGNOSIS — C549 Malignant neoplasm of corpus uteri, unspecified: Secondary | ICD-10-CM | POA: Diagnosis present

## 2023-02-18 DIAGNOSIS — Z90722 Acquired absence of ovaries, bilateral: Secondary | ICD-10-CM | POA: Diagnosis not present

## 2023-02-18 DIAGNOSIS — E119 Type 2 diabetes mellitus without complications: Secondary | ICD-10-CM | POA: Diagnosis not present

## 2023-02-18 DIAGNOSIS — Z90710 Acquired absence of both cervix and uterus: Secondary | ICD-10-CM | POA: Diagnosis not present

## 2023-02-18 DIAGNOSIS — C7963 Secondary malignant neoplasm of bilateral ovaries: Secondary | ICD-10-CM | POA: Diagnosis not present

## 2023-02-18 NOTE — Assessment & Plan Note (Signed)
She has slight neuropathy and is taking gabapentin I will prescribe minor dose adjustment of paclitaxel to reduce risk of neuropathy We discussed the role of cryotherapy

## 2023-02-18 NOTE — Assessment & Plan Note (Addendum)
 Marland Kitchen

## 2023-02-18 NOTE — Progress Notes (Signed)
[facility] CONSULT NOTE  Patient Care Team: [name], FNP as PCP - General (Family Medicine) Yates Decamp, MD as PCP - Cardiology (Cardiology)  ASSESSMENT & PLAN:  Goals of care, counseling/discussion    Endometrial cancer [facility]) We reviewed the NCCN guidelines I recommend treatment based on publication as follows:  Dostarlimab for Primary Advanced or Recurrent Endometrial Cancer  [place] Maryclare Bean, M.D., Cherly Beach. Almeta Monas, M.D., Orion Crook. Slomovitz, M.D., Ren Doreene Adas, Ph.D., Sherri Rad, Ph.D., Doristine Locks, M.D., Lattie Haw, M.D., Aleda Grana, M.D., Sharlene Motts, M.D., Rosezella [place]. Buelah Manis, M.D., Ph.D., Clarise Cruz. Tora Kindred, M.D., Genia Plants, M.D., et al., for the [employer]*  Malva Limes Med [phone] DOI: [id number]  BACKGROUND Dostarlimab is an immune-checkpoint inhibitor that targets the programmed cell death 1 receptor. The combination of chemotherapy and immunotherapy may have synergistic effects in the treatment of endometrial cancer.  METHODS We conducted a phase 3, global, double-blind, randomized, placebo-controlled trial. Eligible patients with primary advanced stage III or IV or first recurrent endometrial cancer were randomly assigned in a 1:1 ratio to receive either dostarlimab (500 mg) or placebo, plus carboplatin (area under the concentration-time curve, 5 mg per milliliter per minute) and paclitaxel (175 mg per square meter of body-surface area), every 3 weeks (six cycles), followed by dostarlimab (1000 mg) or placebo every 6 weeks for up to 3 years. The primary end points were progression-free survival as assessed by the investigator according to Response Evaluation Criteria in Solid Tumors (RECIST), version 1.1, and overall survival. Safety was also assessed.  RESULTS Of the 494 patients who underwent randomization, 118 (23.9%) had mismatch repair-deficient (dMMR), microsatellite instability-high (MSI-H)  tumors. In the dMMR-MSI-H population, estimated progression-free survival at 24 months was 61.4% (95% confidence interval [CI], 46.3 to 73.4) in the dostarlimab group and 15.7% (95% CI, 7.2 to 27.0) in the placebo group (hazard ratio for progression or death, 0.28; 95% CI, 0.16 to 0.50; P<0.001). In the overall population, progression-free survival at 24 months was 36.1% (95% CI, 29.3 to 42.9) in the dostarlimab group and 18.1% (95% CI, 13.0 to 23.9) in the placebo group (hazard ratio, 0.64; 95% CI, 0.51 to 0.80; P<0.001). Overall survival at 24 months was 71.3% (95% CI, 64.5 to 77.1) with dostarlimab and 56.0% (95% CI, 48.9 to 62.5) with placebo (hazard ratio for death, 0.64; 95% CI, 0.46 to 0.87). The most common adverse events that occurred or worsened during treatment were nausea (53.9% of the patients in the dostarlimab group and 45.9% of those in the placebo group), alopecia (53.5% and 50.0%), and fatigue (51.9% and 54.5%). Severe and serious adverse events were more frequent in the dostarlimab group than in the placebo group.  CONCLUSIONS Dostarlimab plus carboplatin-paclitaxel significantly increased progression-free survival among patients with primary advanced or recurrent endometrial cancer, with a substantial benefit in the dMMR-MSI-H population. (Funded by [employer]; [employer].gov number, [id number])  The risks, benefits, side effects of treatment is discussed with the patient and she agreed to proceed with plan of care.   We reviewed the NCCN guidelines We discussed the role of chemotherapy. The intent is of curative intent.  We discussed some of the risks, benefits, side-effects of carboplatin & Taxol & Dostarlimab. Treatment is intravenous, every 3 weeks x 6 cycles followed by maintenance dostarlimab every 6 weeks for up to 2 years  Some of the short term side-effects included, though not limited to, including weight loss, life threatening infections, risk of allergic reactions, need  for transfusions  of blood products, nausea, vomiting, change in bowel habits, abnormal thyroid function test, loss of hair, admission to hospital for various reasons, and risks of death.   Long term side-effects are also discussed including risks of infertility, permanent damage to nerve function, hearing loss, chronic fatigue, kidney damage with possibility needing hemodialysis, and rare secondary malignancy including bone marrow disorders.  The patient is aware that the response rates discussed earlier is not guaranteed.  After a long discussion, patient made an informed decision to proceed with the prescribed plan of care.   Patient education material was dispensed. We discussed premedication with dexamethasone before chemotherapy. Recommend port placement and chemo education class The patient is undecided on the start date of her treatment and we will call her next week for final date before we schedule her treatment   Diabetes mellitus without complication (HCC) She has slight neuropathy and is taking gabapentin I will prescribe minor dose adjustment of paclitaxel to reduce risk of neuropathy We discussed the role of cryotherapy  Orders Placed This Encounter  Procedures   IR IMAGING GUIDED PORT INSERTION    Standing Status:   Future    Expected Date:   02/25/2023    Expiration Date:   02/18/2024    Reason for Exam (SYMPTOM  OR DIAGNOSIS REQUIRED):   need port for chemo to start 1/30    Preferred Imaging Location?:   [facility]    The total time spent in the appointment was 60 minutes encounter with patients including review of chart and various tests results, discussions about plan of care and coordination of care plan   All questions were answered. The patient knows to call the clinic with any problems, questions or concerns. No barriers to learning was detected.  Artis Delay, MD 1/17/20252:17 PM  CHIEF COMPLAINTS/PURPOSE OF CONSULTATION:  Uterine carcinosarcoma,  status post surgery, for adjuvant treatment  HISTORY OF PRESENTING ILLNESS:  Danielle Mullins 63 y.o. female is here because of recent diagnosis of uterine carcinosarcoma.  She is here by herself. She was originally diagnosed after presentation with postmenopausal bleeding.  She had successful surgery. She had mild soreness at her incision site. No further bleeding.  Her energy level is fair.  The patient have chronic bilateral hip pain on gabapentin.  She has been diagnosed with diabetes for over 10 years  I have reviewed her chart and materials related to her cancer extensively and collaborated history with the patient. Summary of oncologic history is as follows: Oncology History Overview Note  Carcinosarcoma - tumor 23.5 cm Vaginal margins (ant/post) negative by 3 mm 12 % MI Tumor focally involves serosa of bilateral ovaries, present in cervical margin (but additional tissue taken as cervical/vaginal margin and negative) 3/6 enlarged pelvic LNs positive  P53 mixed/WT MMRp   Endometrial cancer (HCC)  11/2022 Initial Diagnosis   Presented initially with PMB/discharge starting in 11/2022. After discharge became purulent in appearance, she presented to ED and was admitted from 12/11-12/13/24. Mass was noted superiorly within the vagina, necrotic in appearance. Treatment for PID was recommended.    12/14/2022 Imaging   Pelvic ultrasound:  1. Enlarged and heterogeneous postmenopausal uterus with multiple fibroids. 2. Echogenic 2.4 cm ovoid lesion within the mid uterus. This may represent a fibroid versus endometrial pathology as the endometrium is not identified on today's exam. Multiphase MRI of the pelvis could be obtained for further evaluation, as clinically indicated. 3.  Nonvisualization of the bilateral ovaries.   01/11/2023 Imaging   Pelvic ultrasound:  1. 7.8 cm hypervascular mass within the lower uterine segment, extending toward the cervix, with evidence of a vascular stalk on  Doppler imaging. Differential would include endometrial mass such as polyp versus pedunculated submucosal fibroid. Dedicated nonemergent pelvic MRI with and without contrast is recommended. 2. Nonvisualization of the ovaries and endometrium   01/11/2023 Initial Biopsy   Biopsy of cervical mass - Malignant smooth muscle tumor with necrosis, consistent with leiomyosarcoma, see comment  Differential diagnosis can include a carcinosarcoma.     01/13/2023 Imaging   MRI pelvis: 12.6 cm endocervical mass, extending to the uterine fundus, as above. Myometrial invasion involving the anterior lower uterine segment. No parametrial/serosal extension. Correlate with pending biopsy. Mildly prominent bilateral pelvic/inguinal nodes, indeterminate but suspicious for small nodal metastases. Small volume pelvic ascites.   01/20/2023 Initial Diagnosis   Endometrial cancer (HCC)   02/03/2023 Imaging   CT C/A: 1. Multiple small bilateral pulmonary nodules measuring up to 0.4 cm, nonspecific. These may be infectious or inflammatory although warrant close attention on follow-up as early pulmonary metastatic disease is not excluded. 2. No evidence of lymphadenopathy or metastatic disease in the abdomen. 3. Hepatic steatosis.   02/03/2023 Surgery   Robotic-assisted laparoscopic total hysterectomy with bilateral salpingoophorectomy, bilateral pelvic lymph node dissection, mini laparotomy for specimen delivery, cystoscopy Large dilated cervix secondary to prolapsing mass requiring additional OR time and partial ureterolysis. The complexity of this surgery increased the duration of the procedure by 75 minutes.  Findings: On EUA, large necrotic and foul smelling tumor was filling the vagina prolapsing through the cervix, which was completely dilated. On intra-abdominal entry, normal upper abdominal survey. Normal omentum, small and large bowel. Uterus 10 cm, very bulbous lower uterine segment and cervix. Normal  appearing bilateral adnexa. No ascites. Upon delivery of the uterine specimen, significant necrosis noted of the large (8-10cm) prolapsing mass.  No clearly identifiable cervix, so additional cervical/vaginal margin taken after hysterectomy completed.  Partial ureterolysis performed given dilated cervix.  On cystoscopy after hysterectomy, bladder dome noted to be intact and good efflux seen from bilateral ureteral orifices.  Upon inspection of bilateral nodal basins, prominent left obturator lymph node.  Somewhat calcified appearing left internal iliac lymph node removed with prominent left external iliac lymph nodes.    02/03/2023 Pathology Results   Carcinosarcoma - tumor 23.5 cm Vaginal margins (ant/post) negative by 3 mm 12 % MI Tumor focally involves serosa of bilateral ovaries, present in cervical margin (but additional tissue taken as cervical/vaginal margin and negative) 3/6 enlarged pelvic LNs positive  P53 mixed/WT MMRp   02/03/2023 Pathology Results   SURGICAL PATHOLOGY CASE: [id number] PATIENT: [facility] Surgical Pathology Report  Reason for Addendum #1:  DNA Mismatch Repair IHC Results  Clinical History: endometrial cancer  FINAL MICROSCOPIC DIAGNOSIS:  A. ANTERIOR VAGINAL MARGIN, EXCISION: Malignant mixed mullerian tumor (carcinosarcoma) (pT3b) Distal margin free by 3 mm  B. POSTERIOR VAGINAL MARGIN, EXCISION: Malignant mixed mullerian tumor (carcinosarcoma)(pT3b) Distal margin free by 3 mm  C. UTERUS WITH RIGHT AND LEFT FALLOPIAN TUBE AND OVARY, HYSTERECTOMY AND BILATERAL SALPINGO-OOPHORECTOMY: Malignant mixed mullerian tumor (carcinosarcoma) Tumor measures 23.5 cm in greatest dimension Tumor involves the entire endometrium with the tumor mass predominantly confined to lower uterine segment and cervix with cervical stromal invasion Tumor invades less than 50% of myometrium (2 mm of 17 mm, 12%) Extensive angiolymphatic invasion (uterine and focally within  left fallopian tube)   Tumor focally involves serosa of right and left ovary Tumor present in cervical margin Benign leiomyoma,  intramural, largest measuring 2.1 cm in greatest dimension Benign fallopian tubes  D. LEFT OBTURATOR LYMPH NODE, RESECTION: Two benign lymph nodes, negative for carcinoma (0/2)  E. LEFT EXTERNAL ILIAC LYMPH NODE, RESECTION: Two of 3 lymph nodes with metastatic carcinoma (2/3) Extensive perinodal intralymphatic tumor  F. RIGHT EXTERNAL ILIAC LYMPH NODE, RESECTION: One lymph node with a micrometastasis of carcinoma (1/1)  ONCOLOGY TABLE:  UTERUS, CARCINOMA OR CARCINOSARCOMA: Resection  Procedure: Total hysterectomy and bilateral salpingo-oophorectomy with additional vaginal margin and lymph node Histologic Type: Malignant mixed mullerian tumor (carcinosarcoma) Histologic Grade: High-grade (FIGO 3) Myometrial Invasion:      Depth of Myometrial Invasion (mm): 2 mm      Myometrial Thickness (mm): 17 mm      Percentage of Myometrial Invasion: 12% Uterine Serosa Involvement: Not identified Cervical stromal Involvement: Present Extent of involvement of other tissue/organs: Tumor invades vaginal mucosa Peritoneal/Ascitic Fluid: Positive for adenocarcinoma Lymphovascular Invasion: Present and extensive Regional Lymph Nodes:  Pelvic Lymph Nodes Examined:          0 Sentinel          6 non-sentinel          6 total      Pelvic Lymph Nodes with Metastasis: 3          Macrometastasis: (>2.0 mm): 2          Micrometastasis: (>0.2 mm and < 2.0 mm): 1 (right external iliac)          Isolated Tumor Cells (<0.2 mm): 0          Laterality of Lymph Node with Tumor: Left external iliac and right external iliac          Extracapsular Extension: Not identified      Para-aortic Lymph Nodes Examined:  0 Distant Metastasis: Not applicable Pathologic Stage Classification (pTNM, AJCC 8th Edition): pT3b, pN1a Ancillary Studies: MMR has been ordered; results will be issued  within an addendum.     02/14/2023 Cancer Staging   Staging form: Corpus Uteri - Carcinoma and Carcinosarcoma, AJCC 8th Edition and FIGO 2023 - Pathologic stage from 02/14/2023: Stage IIIC1 (pT3, pN1, cM0, POLE: Unknown, MMRd-, p53-) - Signed by Artis Delay, MD on 02/14/2023 Stage prefix: Initial diagnosis     MEDICAL HISTORY:  Past Medical History:  Diagnosis Date   Arthritis    Diabetes mellitus without complication (HCC)    Fibroids    Heart disease, unspecified e   enlarged heart chamber- patient has referral   Hypertension    Ingrown toenail 01/13/2015   Mobitz type 2 second degree AV block 03/24/2020   Plantar fasciitis 01/13/2015   Porokeratosis 01/13/2015   Sesamoiditis 01/13/2015   Sleep apnea    Symptomatic bradycardia 03/24/2020    SURGICAL HISTORY: Past Surgical History:  Procedure Laterality Date   LYMPH NODE DISSECTION N/A 02/03/2023   Procedure: PELVIC LYMPH NODE DISSECTION;  Surgeon: Carver Fila, MD;  Location: [facility];  Service: Gynecology;  Laterality: N/A;   ROBOTIC ASSISTED TOTAL HYSTERECTOMY WITH BILATERAL SALPINGO OOPHERECTOMY Bilateral 02/03/2023   Procedure: DIAGNOSTIC LAPAROSCOPY  XI ROBOTIC ASSISTED TOTAL HYSTERECTOMY WITH BILATERAL SALPINGO OOPHORECTOMY, CYSTOSCOPY, MINI LAPAROTOMY;  Surgeon: Carver Fila, MD;  Location: [facility];  Service: Gynecology;  Laterality: Bilateral;    SOCIAL HISTORY: Social History   Socioeconomic History   Marital status: Divorced    Spouse name: Not on file   Number of children: 0   Years of education: Not on file   Highest education level:  Not on file  Occupational History   Not on file  Tobacco Use   Smoking status: Never   Smokeless tobacco: Never  Vaping Use   Vaping status: Never Used  Substance and Sexual Activity   Alcohol use: No    Alcohol/week: 0.0 standard drinks of alcohol   Drug use: No   Sexual activity: Not Currently  Other Topics Concern   Not on file  Social History Narrative    Not on file   Social Drivers of Health   Financial Resource Strain: Not on file  Food Insecurity: Patient Declined (02/03/2023)   Hunger Vital Sign    Worried About Running Out of Food in the Last Year: Patient declined    Ran Out of Food in the Last Year: Patient declined  Transportation Needs: Unknown (02/03/2023)   PRAPARE - Administrator, Civil Service (Medical): Patient unable to answer    Lack of Transportation (Non-Medical): Patient declined  Physical Activity: Not on file  Stress: Not on file  Social Connections: Not on file  Intimate Partner Violence: Not At Risk (02/03/2023)   Humiliation, Afraid, Rape, and Kick questionnaire    Fear of Current or Ex-Partner: No    Emotionally Abused: No    Physically Abused: No    Sexually Abused: No    FAMILY HISTORY: Family History  Problem Relation Age of Onset   Diabetes Mother    Hypertension Mother    Heart disease Father    Hypertension Father    Diabetes Brother    Breast cancer Neg Hx    Prostate cancer Neg Hx    Colon cancer Neg Hx    Endometrial cancer Neg Hx    Ovarian cancer Neg Hx    Pancreatic cancer Neg Hx    Cancer - Ovarian Neg Hx     ALLERGIES:  is allergic to celecoxib.  MEDICATIONS:  Current Outpatient Medications  Medication Sig Dispense Refill   ezetimibe (ZETIA) 10 MG tablet Take 1 tablet (10 mg total) by mouth daily. 90 tablet 0   gabapentin (NEURONTIN) 100 MG capsule Take 100 mg by mouth at bedtime as needed (nerve pain).     metFORMIN (GLUCOPHAGE) 500 MG tablet Take 500 mg by mouth 2 (two) times daily with a meal.     oxyCODONE (ROXICODONE) 5 MG immediate release tablet Take 1 tablet (5 mg total) by mouth every 4 (four) hours as needed for severe pain (pain score 7-10). 15 tablet 0   RYBELSUS 7 MG TABS Take 1 tablet by mouth daily.     simvastatin (ZOCOR) 40 MG tablet Take 40 mg by mouth at bedtime.     No current facility-administered medications for this visit.    REVIEW OF SYSTEMS:    Constitutional: Denies fevers, chills or abnormal night sweats Eyes: Denies blurriness of vision, double vision or watery eyes Ears, nose, mouth, throat, and face: Denies mucositis or sore throat Respiratory: Denies cough, dyspnea or wheezes Cardiovascular: Denies palpitation, chest discomfort or lower extremity swelling Gastrointestinal:  Denies nausea, heartburn or change in bowel habits Skin: Denies abnormal skin rashes Lymphatics: Denies new lymphadenopathy or easy bruising Neurological:Denies numbness, tingling or new weaknesses Behavioral/Psych: Mood is stable, no new changes  All other systems were reviewed with the patient and are negative.  PHYSICAL EXAMINATION: ECOG PERFORMANCE STATUS: 1 - Symptomatic but completely ambulatory  Vitals:   02/18/23 1330  BP: 136/71  Pulse: 86  Resp: 16  Temp: 97.7 F (36.5 C)  SpO2:  99%   Filed Weights   02/18/23 1330  Weight: 185 lb 12.8 oz (84.3 kg)    GENERAL:alert, no distress and comfortable SKIN: skin color, texture, turgor are normal, no rashes or significant lesions EYES: normal, conjunctiva are pink and non-injected, sclera clear OROPHARYNX:no exudate, no erythema and lips, buccal mucosa, and tongue normal  NECK: supple, thyroid normal size, non-tender, without nodularity LYMPH:  no palpable lymphadenopathy in the cervical, axillary or inguinal LUNGS: clear to auscultation and percussion with normal breathing effort HEART: regular rate & rhythm and no murmurs and no lower extremity edema ABDOMEN:abdomen soft, non-tender and normal bowel sounds.  Noted well-healed surgical scar Musculoskeletal:no cyanosis of digits and no clubbing  PSYCH: alert & oriented x 3 with fluent speech NEURO: no focal motor/sensory deficits  LABORATORY DATA:  I have reviewed the data as listed Lab Results  Component Value Date   WBC 6.6 02/05/2023   HGB 8.3 (L) 02/05/2023   HCT 25.6 (L) 02/05/2023   MCV 77.8 (L) 02/05/2023   PLT 325  02/05/2023   Recent Labs    01/11/23 1427 01/12/23 0428 01/24/23 1431 02/04/23 0332 02/04/23 1809 02/05/23 0422 02/05/23 1355  NA 133*   < > 136   < > 133* 136 137  K 4.1   < > 3.4*   < > 3.6 3.5 3.6  CL 99   < > 100   < > 101 104 107  CO2 19*   < > 24   < > 21* 22 20*  GLUCOSE 154*   < > 178*   < > 148* 118* 116*  BUN 24*   < > 10   < > 23 22 18   CREATININE 1.76*   < > 1.07*   < > 1.78* 1.39* 0.95  CALCIUM 9.2   < > 8.8*   < > 8.0* 7.8* 8.2*  GFRNONAA 32*   < > 59*   < > 32* 43* >60  PROT 8.1  --  7.6  --   --   --   --   ALBUMIN 3.0*  --  3.0*  --   --   --   --   AST 24  --  15  --   --   --   --   ALT 15  --  9  --   --   --   --   ALKPHOS 63  --  46  --   --   --   --   BILITOT 0.9  --  0.3  --   --   --   --    < > = values in this interval not displayed.    RADIOGRAPHIC STUDIES: I have personally reviewed the radiological images as listed and agreed with the findings in the report. CT Chest W Contrast Result Date: 02/03/2023 CLINICAL DATA:  Metastatic disease evaluation, leiomyosarcoma, endometrial cancer * Tracking Code: BO * EXAM: CT CHEST AND ABDOMEN WITH CONTRAST TECHNIQUE: Multidetector CT imaging of the chest and abdomen was performed following the standard protocol during bolus administration of intravenous contrast. RADIATION DOSE REDUCTION: This exam was performed according to the departmental dose-optimization program which includes automated exposure control, adjustment of the mA and/or kV according to patient size and/or use of iterative reconstruction technique. CONTRAST:  OMNIPAQUE IOHEXOL 300 MG/ML SOLN additional oral enteric contrast COMPARISON:  MR pelvis, 01/13/2023 FINDINGS: CT CHEST FINDINGS Cardiovascular: No significant vascular findings. Normal heart size. No pericardial effusion. Mediastinum/Nodes: No  enlarged mediastinal, hilar, or axillary lymph nodes. Thyroid gland, trachea, and esophagus demonstrate no significant findings. Lungs/Pleura: Small  fissural nodules of the left lower lobe measuring up to 0.4 cm (series 7, image 71). Multiple small bilateral pulmonary nodules, for example a 0.3 cm subpleural nodule of the lateral segment right middle lobe (series 7, image 89) and a 0.3 nodule of the dependent left lower lobe (series 7, image 97). No pleural effusion or pneumothorax. Musculoskeletal: No chest wall abnormality. No acute osseous findings. CT ABDOMEN FINDINGS Hepatobiliary: Low-attenuation lesion of the left lobe of the liver measuring 0.5 cm, likely a tiny cyst or hemangioma (series 2, image 16). Hepatic steatosis. Focal fatty deposition adjacent to the falciform ligament, characteristic in location and appearance (series 2, image 43). No gallstones, gallbladder wall thickening, or biliary dilatation. Pancreas: Unremarkable. No pancreatic ductal dilatation or surrounding inflammatory changes. Spleen: Normal in size without significant abnormality. Adrenals/Urinary Tract: Adrenal glands are unremarkable. Kidneys are normal, without renal calculi, solid lesion, or hydronephrosis. Stomach/Bowel: Stomach is within normal limits. Appendix appears normal. No evidence of bowel wall thickening, distention, or inflammatory changes. Vascular/Lymphatic: Aortic atherosclerosis. No enlarged abdominal lymph nodes. Other: No abdominal wall hernia or abnormality. No ascites. Musculoskeletal: No acute osseous findings. IMPRESSION: 1. Multiple small bilateral pulmonary nodules measuring up to 0.4 cm, nonspecific. These may be infectious or inflammatory although warrant close attention on follow-up as early pulmonary metastatic disease is not excluded. 2. No evidence of lymphadenopathy or metastatic disease in the abdomen. 3. Hepatic steatosis. Aortic Atherosclerosis (ICD10-I70.0). Electronically Signed   By: Jearld Lesch M.D.   On: 02/03/2023 08:13   CT ABDOMEN W CONTRAST Result Date: 02/03/2023 CLINICAL DATA:  Metastatic disease evaluation, leiomyosarcoma,  endometrial cancer * Tracking Code: BO * EXAM: CT CHEST AND ABDOMEN WITH CONTRAST TECHNIQUE: Multidetector CT imaging of the chest and abdomen was performed following the standard protocol during bolus administration of intravenous contrast. RADIATION DOSE REDUCTION: This exam was performed according to the departmental dose-optimization program which includes automated exposure control, adjustment of the mA and/or kV according to patient size and/or use of iterative reconstruction technique. CONTRAST:  OMNIPAQUE IOHEXOL 300 MG/ML SOLN additional oral enteric contrast COMPARISON:  MR pelvis, 01/13/2023 FINDINGS: CT CHEST FINDINGS Cardiovascular: No significant vascular findings. Normal heart size. No pericardial effusion. Mediastinum/Nodes: No enlarged mediastinal, hilar, or axillary lymph nodes. Thyroid gland, trachea, and esophagus demonstrate no significant findings. Lungs/Pleura: Small fissural nodules of the left lower lobe measuring up to 0.4 cm (series 7, image 71). Multiple small bilateral pulmonary nodules, for example a 0.3 cm subpleural nodule of the lateral segment right middle lobe (series 7, image 89) and a 0.3 nodule of the dependent left lower lobe (series 7, image 97). No pleural effusion or pneumothorax. Musculoskeletal: No chest wall abnormality. No acute osseous findings. CT ABDOMEN FINDINGS Hepatobiliary: Low-attenuation lesion of the left lobe of the liver measuring 0.5 cm, likely a tiny cyst or hemangioma (series 2, image 16). Hepatic steatosis. Focal fatty deposition adjacent to the falciform ligament, characteristic in location and appearance (series 2, image 43). No gallstones, gallbladder wall thickening, or biliary dilatation. Pancreas: Unremarkable. No pancreatic ductal dilatation or surrounding inflammatory changes. Spleen: Normal in size without significant abnormality. Adrenals/Urinary Tract: Adrenal glands are unremarkable. Kidneys are normal, without renal calculi, solid  lesion, or hydronephrosis. Stomach/Bowel: Stomach is within normal limits. Appendix appears normal. No evidence of bowel wall thickening, distention, or inflammatory changes. Vascular/Lymphatic: Aortic atherosclerosis. No enlarged abdominal lymph nodes. Other: No abdominal wall  hernia or abnormality. No ascites. Musculoskeletal: No acute osseous findings. IMPRESSION: 1. Multiple small bilateral pulmonary nodules measuring up to 0.4 cm, nonspecific. These may be infectious or inflammatory although warrant close attention on follow-up as early pulmonary metastatic disease is not excluded. 2. No evidence of lymphadenopathy or metastatic disease in the abdomen. 3. Hepatic steatosis. Aortic Atherosclerosis (ICD10-I70.0). Electronically Signed   By: Jearld Lesch M.D.   On: 02/03/2023 08:13

## 2023-02-18 NOTE — Assessment & Plan Note (Signed)
We reviewed the NCCN guidelines I recommend treatment based on publication as follows:  Dostarlimab for Primary Advanced or Recurrent Endometrial Cancer  [place] Maryclare Bean, M.D., Cherly Beach. Almeta Monas, M.D., Orion Crook. Slom[place], M.D., Ren Doreene Adas, Ph.D., Sherri Rad, Ph.D., Doristine Locks, M.D., Lattie Haw, M.D., Aleda Grana, M.D., Sharlene Motts, M.D., Rosezella [place]. Buelah Manis, M.D., Ph.D., Clarise Cruz. Tora Kindred, M.D., Genia Plants, M.D., et al., for the [employer]*  Malva Limes Med [phone] DOI: [id number]  BACKGROUND Dostarlimab is an immune-checkpoint inhibitor that targets the programmed cell death 1 receptor. The combination of chemotherapy and immunotherapy may have synergistic effects in the treatment of endometrial cancer.  METHODS We conducted a phase 3, global, double-blind, randomized, placebo-controlled trial. Eligible patients with primary advanced stage III or IV or first recurrent endometrial cancer were randomly assigned in a 1:1 ratio to receive either dostarlimab (500 mg) or placebo, plus carboplatin (area under the concentration-time curve, 5 mg per milliliter per minute) and paclitaxel (175 mg per square meter of body-surface area), every 3 weeks (six cycles), followed by dostarlimab (1000 mg) or placebo every 6 weeks for up to 3 years. The primary end points were progression-free survival as assessed by the investigator according to Response Evaluation Criteria in Solid Tumors (RECIST), version 1.1, and overall survival. Safety was also assessed.  RESULTS Of the 494 patients who underwent randomization, 118 (23.9%) had mismatch repair-deficient (dMMR), microsatellite instability-high (MSI-H) tumors. In the dMMR-MSI-H population, estimated progression-free survival at 24 months was 61.4% (95% confidence interval [CI], 46.3 to 73.4) in the dostarlimab group and 15.7% (95% CI, 7.2 to 27.0) in the placebo group (hazard ratio for progression or death, 0.28;  95% CI, 0.16 to 0.50; P<0.001). In the overall population, progression-free survival at 24 months was 36.1% (95% CI, 29.3 to 42.9) in the dostarlimab group and 18.1% (95% CI, 13.0 to 23.9) in the placebo group (hazard ratio, 0.64; 95% CI, 0.51 to 0.80; P<0.001). Overall survival at 24 months was 71.3% (95% CI, 64.5 to 77.1) with dostarlimab and 56.0% (95% CI, 48.9 to 62.5) with placebo (hazard ratio for death, 0.64; 95% CI, 0.46 to 0.87). The most common adverse events that occurred or worsened during treatment were nausea (53.9% of the patients in the dostarlimab group and 45.9% of those in the placebo group), alopecia (53.5% and 50.0%), and fatigue (51.9% and 54.5%). Severe and serious adverse events were more frequent in the dostarlimab group than in the placebo group.  CONCLUSIONS Dostarlimab plus carboplatin-paclitaxel significantly increased progression-free survival among patients with primary advanced or recurrent endometrial cancer, with a substantial benefit in the dMMR-MSI-H population. (Funded by [employer]; [employer].gov number, [id number])  The risks, benefits, side effects of treatment is discussed with the patient and she agreed to proceed with plan of care.   We reviewed the NCCN guidelines We discussed the role of chemotherapy. The intent is of curative intent.  We discussed some of the risks, benefits, side-effects of carboplatin & Taxol & Dostarlimab. Treatment is intravenous, every 3 weeks x 6 cycles followed by maintenance dostarlimab every 6 weeks for up to 2 years  Some of the short term side-effects included, though not limited to, including weight loss, life threatening infections, risk of allergic reactions, need for transfusions of blood products, nausea, vomiting, change in bowel habits, abnormal thyroid function test, loss of hair, admission to hospital for various reasons, and risks of death.   Long term side-effects are also discussed including risks of infertility,  permanent damage to nerve function,  hearing loss, chronic fatigue, kidney damage with possibility needing hemodialysis, and rare secondary malignancy including bone marrow disorders.  The patient is aware that the response rates discussed earlier is not guaranteed.  After a long discussion, patient made an informed decision to proceed with the prescribed plan of care.   Patient education material was dispensed. We discussed premedication with dexamethasone before chemotherapy. Recommend port placement and chemo education class The patient is undecided on the start date of her treatment and we will call her next week for final date before we schedule her treatment

## 2023-02-21 ENCOUNTER — Telehealth: Payer: Self-pay | Admitting: Oncology

## 2023-02-21 NOTE — Telephone Encounter (Signed)
[facility] and she would like to have her port placed on 03/04/23 so that her brother can bring her.  She would like to start chemotherapy on 2/5 or 2/6 if possible.

## 2023-02-21 NOTE — Telephone Encounter (Signed)
Called Danielle Mullins back with the port placement appointment on 03/04/23 with arrival at [facility] at 11:30.  Gave her instructions for NPO (no food after midnight and clear liquids until  7am), she will need a driver to pick her up and someone to stay with her 24 hours after the procedure.  Also advised her that the scheduler will call her with the date/time for her chemotherapy appointment.  She verbalized understanding and agreement.

## 2023-02-22 ENCOUNTER — Telehealth: Payer: Self-pay

## 2023-02-22 NOTE — Telephone Encounter (Signed)
Returned her call and review upcoming appts. She is aware of appts.

## 2023-02-23 ENCOUNTER — Telehealth: Payer: Self-pay | Admitting: Hematology and Oncology

## 2023-02-23 ENCOUNTER — Encounter: Payer: Self-pay | Admitting: Hematology and Oncology

## 2023-02-23 ENCOUNTER — Other Ambulatory Visit: Payer: Self-pay | Admitting: Hematology and Oncology

## 2023-02-23 DIAGNOSIS — C541 Malignant neoplasm of endometrium: Secondary | ICD-10-CM

## 2023-02-23 MED ORDER — ONDANSETRON HCL 8 MG PO TABS: 8.0000 mg | ORAL_TABLET | Freq: Three times a day (TID) | ORAL | 1 refills | Status: DC | PRN

## 2023-02-23 MED ORDER — DEXAMETHASONE 4 MG PO TABS: ORAL_TABLET | ORAL | 6 refills | Status: DC

## 2023-02-23 MED ORDER — LIDOCAINE-PRILOCAINE 2.5-2.5 % EX CREA: TOPICAL_CREAM | CUTANEOUS | 3 refills | Status: DC

## 2023-02-23 MED ORDER — PROCHLORPERAZINE MALEATE 10 MG PO TABS: 10.0000 mg | ORAL_TABLET | Freq: Four times a day (QID) | ORAL | 1 refills | Status: DC | PRN

## 2023-02-23 NOTE — Telephone Encounter (Addendum)
Called Danielle Mullins back and discussed rescheduling her chemo ed/lab appointments to 03/09/23 and to start chemo on 03/10/23.  She is fine with both dates. Rescheduled the chemo education class to 9:00 and lab at 11:00 on 03/09/23.  Let her know a scheduler will call her with the infusion appointment time on 03/10/23.  She verbalized understanding and agreement.

## 2023-02-23 NOTE — Progress Notes (Unsigned)
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 After discharge became purulent in appearance, she presented to ED and was admitted from 12/11-12/13/24. Mass was noted superiorly within the vagina, necrotic in appearance. Treatment for PID was recommended.    12/14/2022 Imaging   Pelvic ultrasound:  1. Enlarged and heterogeneous postmenopausal uterus with multiple fibroids. 2. Echogenic 2.4 cm ovoid lesion within the mid uterus. This may represent a fibroid versus endometrial pathology as the endometrium is not identified on today's exam. Multiphase MRI of the pelvis could be obtained for further evaluation, as clinically indicated. 3.  Nonvisualization of the bilateral ovaries.   01/11/2023 Imaging   Pelvic ultrasound: 1. 7.8 cm hypervascular mass within the lower uterine segment, extending toward the cervix, with evidence of a vascular stalk on Doppler imaging. Differential would include endometrial mass such as polyp versus pedunculated submucosal fibroid. Dedicated nonemergent pelvic MRI with and without contrast is recommended. 2. Nonvisualization of the ovaries and endometrium   01/11/2023 Initial Biopsy   Biopsy of cervical mass - Malignant smooth muscle tumor with necrosis, consistent with leiomyosarcoma, see comment  Differential diagnosis can include a carcinosarcoma.     01/13/2023 Imaging   MRI pelvis: 12.6 cm endocervical mass, extending to the uterine fundus, as above. Myometrial invasion  involving the anterior lower uterine segment. No parametrial/serosal extension. Correlate with pending biopsy. Mildly prominent bilateral pelvic/inguinal nodes, indeterminate but suspicious for small nodal metastases. Small volume pelvic ascites.   01/20/2023 Initial Diagnosis   Endometrial cancer (HCC)   02/03/2023 Imaging   CT C/A: 1. Multiple small bilateral pulmonary nodules measuring up to 0.4 cm, nonspecific. These may be infectious or inflammatory although warrant close attention on follow-up as early pulmonary metastatic disease is not excluded. 2. No evidence of lymphadenopathy or metastatic disease in the abdomen. 3. Hepatic steatosis.   02/03/2023 Surgery   Robotic-assisted laparoscopic total hysterectomy with bilateral salpingoophorectomy, bilateral pelvic lymph node dissection, mini laparotomy for specimen delivery, cystoscopy Large dilated cervix secondary to prolapsing mass requiring additional OR time and partial ureterolysis. The complexity of this surgery increased the duration of the procedure by 75 minutes.  Findings: On EUA, large necrotic and foul smelling tumor was filling the vagina prolapsing through the cervix, which was completely dilated. On intra-abdominal entry, normal upper abdominal survey. Normal omentum, small and large bowel. Uterus 10 cm, very bulbous lower uterine segment and cervix. Normal appearing bilateral adnexa. No ascites. Upon delivery of the uterine specimen, significant necrosis noted of the large (8-10cm) prolapsing mass.  No clearly identifiable cervix, so additional cervical/vaginal margin taken after hysterectomy completed.  Partial ureterolysis performed given dilated cervix.  On cystoscopy after hysterectomy, bladder dome noted to be intact and good efflux seen from bilateral ureteral orifices.  Upon inspection of bilateral nodal basins, prominent left obturator lymph node.  Somewhat calcified appearing left internal iliac lymph node removed with  prominent left external iliac lymph nodes.    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 ANTERIOR VAGINAL MARGIN, EXCISION: Malignant mixed mullerian tumor (carcinosarcoma) (pT3b) Distal margin free by 3 mm  B. POSTERIOR VAGINAL MARGIN, EXCISION: Malignant mixed mullerian tumor (carcinosarcoma)(pT3b) Distal margin free by 3 mm  C. UTERUS WITH RIGHT AND LEFT FALLOPIAN TUBE AND OVARY, HYSTERECTOMY AND BILATERAL SALPINGO-OOPHORECTOMY: Malignant mixed mullerian tumor (carcinosarcoma) Tumor measures 23.5 cm in greatest dimension Tumor involves the entire endometrium with the tumor mass predominantly confined to lower uterine segment and cervix with cervical stromal invasion Tumor invades less than 50% of myometrium (2 mm of 17 mm, 12%) Extensive angiolymphatic invasion (uterine and focally within left fallopian tube)   Tumor focally involves serosa of right and left ovary Tumor present in cervical margin Benign leiomyoma, intramural, largest measuring 2.1 cm in greatest dimension Benign fallopian tubes  D. LEFT OBTURATOR LYMPH NODE, RESECTION: Two benign lymph nodes, negative for carcinoma (0/2)  E. LEFT EXTERNAL ILIAC LYMPH NODE, RESECTION: Two of 3 lymph nodes with metastatic carcinoma (2/3) Extensive perinodal intralymphatic tumor  F. RIGHT EXTERNAL ILIAC LYMPH NODE, RESECTION: One lymph node with a micrometastasis of carcinoma (1/1)  ONCOLOGY  TABLE:  UTERUS, CARCINOMA OR CARCINOSARCOMA: Resection  Procedure: Total hysterectomy and bilateral salpingo-oophorectomy with additional vaginal margin and lymph node Histologic Type: Malignant mixed mullerian tumor (carcinosarcoma) Histologic Grade: High-grade (FIGO 3) Myometrial Invasion:      Depth of Myometrial Invasion (mm): 2 mm      Myometrial Thickness (mm): 17 mm      Percentage of Myometrial Invasion: 12% Uterine Serosa Involvement: Not identified Cervical stromal Involvement: Present Extent of involvement of other tissue/organs: Tumor invades vaginal mucosa Peritoneal/Ascitic Fluid: Positive for adenocarcinoma Lymphovascular Invasion: Present and extensive Regional Lymph Nodes:  Pelvic Lymph Nodes Examined:          0 Sentinel          6 non-sentinel          6 total      Pelvic Lymph Nodes with Metastasis: 3          Macrometastasis: (>2.0 mm): 2          Micrometastasis: (>0.2 mm and < 2.0 mm): 1 (right external iliac)          Isolated Tumor Cells (<0.2 mm): 0          Laterality of Lymph Node with Tumor: Left external iliac and right external iliac          Extracapsular Extension: Not identified      Para-aortic Lymph Nodes Examined:  0 Distant Metastasis: Not applicable Pathologic Stage Classification (pTNM, AJCC 8th Edition): pT3b, pN1a Ancillary Studies: MMR has been ordered; results will be issued within an addendum.     02/14/2023 Cancer Staging   Staging form: Corpus Uteri - Carcinoma and Carcinosarcoma, AJCC 8th Edition and FIGO 2023 - Pathologic stage from 02/14/2023: Stage IIIC1 (pT3, pN1, cM0, POLE: Unknown, MMRd-, p53-) - Signed by Artis Delay, MD on 02/14/2023 Stage prefix: Initial diagnosis   03/10/2023 -  Chemotherapy   Patient is on Treatment Plan : UTERINE ENDOMETRIAL Dostarlimab-gxly (500 mg) + Carboplatin (AUC 5) + Paclitaxel (175 mg/m2) q21d x 6  cycles / Dostarlimab-gxly (1000 mg) q42d x 6 cycles        Interval History: Doing well.  Nuys  any vaginal bleeding.  Reports still some constipation, was taking MiraLAX until last week.  Notes that she may not have a bowel movement for 2 or 3 days but then will have a very large bowel movement and may have some loose stools afterwards.  Past Medical/Surgical History: Past Medical History:  Diagnosis Date   Arthritis    Diabetes mellitus without complication (HCC)    Fibroids    Heart disease, unspecified e   enlarged heart chamber- patient has referral   Hypertension    Ingrown toenail 01/13/2015   Mobitz type 2 second degree AV block 03/24/2020   Plantar fasciitis 01/13/2015   Porokeratosis 01/13/2015   Sesamoiditis 01/13/2015   Sleep apnea    Symptomatic bradycardia 03/24/2020    Past Surgical History:  Procedure Laterality Date   LYMPH NODE DISSECTION N/A 02/03/2023   Procedure: PELVIC LYMPH NODE DISSECTION;  Surgeon: Carver Fila, MD;  Location: [facility];  Service: Gynecology;  Laterality: N/A;   ROBOTIC ASSISTED TOTAL HYSTERECTOMY WITH BILATERAL SALPINGO OOPHERECTOMY Bilateral 02/03/2023   Procedure: DIAGNOSTIC LAPAROSCOPY  XI ROBOTIC ASSISTED TOTAL HYSTERECTOMY WITH BILATERAL SALPINGO OOPHORECTOMY, CYSTOSCOPY, MINI LAPAROTOMY;  Surgeon: Carver Fila, MD;  Location: [facility];  Service: Gynecology;  Laterality: Bilateral;    Family History  Problem Relation Age of Onset   Diabetes Mother    Hypertension Mother    Heart disease Father    Hypertension Father    Diabetes Brother    Breast cancer Neg Hx    Prostate cancer Neg Hx    Colon cancer Neg Hx    Endometrial cancer Neg Hx    Ovarian cancer Neg Hx    Pancreatic cancer Neg Hx    Cancer - Ovarian Neg Hx     Social History   Socioeconomic History   Marital status: Divorced    Spouse name: Not on file   Number of children: 0   Years of education: Not on file   Highest education level: Not on file  Occupational History   Not on file  Tobacco Use   Smoking status: Never   Smokeless tobacco: Never   Vaping Use   Vaping status: Never Used  Substance and Sexual Activity   Alcohol use: No    Alcohol/week: 0.0 standard drinks of alcohol   Drug use: No   Sexual activity: Not Currently  Other Topics Concern   Not on file  Social History Narrative   Not on file   Social Drivers of Health   Financial Resource Strain: Not on file  Food Insecurity: Patient Declined (02/03/2023)   Hunger Vital Sign    Worried About Running Out of Food in the Last Year: Patient declined    Ran Out of Food in the Last Year: Patient declined  Transportation Needs: Unknown (02/03/2023)   PRAPARE - Administrator, Civil Service (Medical): Patient unable to answer    Lack of Transportation (Non-Medical): Patient declined  Physical Activity: Not on file  Stress: Not on file  Social Connections: Not on file    Current Medications:  Current Outpatient Medications:    ezetimibe (ZETIA) 10 MG tablet, Take 1 tablet (10 mg total) by mouth daily., Disp: 90 tablet, Rfl: 0   gabapentin (NEURONTIN) 100 MG capsule, Take 100 mg by mouth at bedtime as needed (nerve pain)., Disp: , Rfl:  lisinopril (ZESTRIL) 10 MG tablet, Take 10 mg by mouth daily., Disp: , Rfl:    metFORMIN (GLUCOPHAGE) 500 MG tablet, Take 500 mg by mouth 2 (two) times daily with a meal., Disp: , Rfl:    oxyCODONE (ROXICODONE) 5 MG immediate release tablet, Take 1 tablet (5 mg total) by mouth every 4 (four) hours as needed for severe pain (pain score 7-10)., Disp: 15 tablet, Rfl: 0   RYBELSUS 7 MG TABS, Take 1 tablet by mouth daily., Disp: , Rfl:    simvastatin (ZOCOR) 40 MG tablet, Take 40 mg by mouth at bedtime., Disp: , Rfl:    dexamethasone (DECADRON) 4 MG tablet, Take 2 tabs at the night before and 2 tab the morning of chemotherapy, every 3 weeks, by mouth x 6 cycles, Disp: 24 tablet, Rfl: 6   lidocaine-prilocaine (EMLA) cream, Apply to affected area once, Disp: 30 g, Rfl: 3   ondansetron (ZOFRAN) 8 MG tablet, Take 1 tablet (8 mg total)  by mouth every 8 (eight) hours as needed for nausea or vomiting. Start on the third day after chemotherapy., Disp: 30 tablet, Rfl: 1   prochlorperazine (COMPAZINE) 10 MG tablet, Take 1 tablet (10 mg total) by mouth every 6 (six) hours as needed for nausea or vomiting., Disp: 30 tablet, Rfl: 1  Review of Systems: + Decreased appetite, joint pain Denies fevers, chills, fatigue, unexplained weight changes. Denies hearing loss, neck lumps or masses, mouth sores, ringing in ears or voice changes. Denies cough or wheezing.  Denies shortness of breath. Denies chest pain or palpitations. Denies leg swelling. Denies abdominal distention, pain, blood in stools, constipation, diarrhea, nausea, vomiting, or early satiety. Denies pain with intercourse, dysuria, frequency, hematuria or incontinence. Denies hot flashes, pelvic pain, vaginal bleeding or vaginal discharge.   Denies back pain or muscle pain/cramps. Denies itching, rash, or wounds. Denies dizziness, headaches, numbness or seizures. Denies swollen lymph nodes or glands, denies easy bruising or bleeding. Denies anxiety, depression, confusion, or decreased concentration.  Physical Exam: BP (!) 147/77 (BP Location: Right Arm, Patient Position: Sitting) Comment: Notified RN  Pulse 60   Temp 98.9 F (37.2 C) (Oral)   Resp 20   Wt 184 lb 12.8 oz (83.8 kg)   SpO2 100%   BMI 29.83 kg/m  General: Alert, oriented, no acute distress. HEENT: Normocephalic, atraumatic, sclera anicteric. Chest: Unlabored breathing on room air. Abdomen: oft, nontender.  Normoactive bowel sounds.  No masses or hepatosplenomegaly appreciated.  Well-healed incisions. Extremities: Grossly normal range of motion.  Warm, well perfused.  No edema bilaterally. GU: Normal appearing external genitalia without erythema, excoriation, or lesions.  Speculum exam reveals cuff intact, minimal discharge at the apex, sutures visible.  Bimanual exam reveals intact, no fluctuance or  significant tenderness to palpation.  Patient is quite uncomfortable with the entire exam given prior history.    Laboratory & Radiologic Studies: None new  Assessment & Plan: Danielle Mullins is a 63 y.o. 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  Discussed continued expectations and restrictions.  Reviewed pathology from surgery again.  She was given a copy of her pathology report.  In the setting of high risk disease with nodal involvement, discussed recommendation for adjuvant chemotherapy and vaginal brachytherapy.  She has met with Dr. Bertis Ruddy already.  She has an upcoming appointment with Dr. Roselind Messier.  Given her poor toleration of pelvic exams, I will reach out to Dr. Roselind Messier out options for  vaginal brachytherapy.  22 minutes of total time was spent for this patient encounter, including preparation, face-to-face counseling with the patient and coordination of care, and documentation of the encounter.  Eugene Garnet, MD  Division of Gynecologic Oncology  Department of Obstetrics and Gynecology  [facility]

## 2023-02-23 NOTE — Progress Notes (Signed)
START OFF PATHWAY REGIMEN - Uterine   [id number]:Carboplatin AUC=5 IV D1 + Dostarlimab 500 mg IV D1 + Paclitaxel 175 mg/m2 IV D1 x 6 Cycles Followed by Dostarlimab 1,000 mg IV D1 q42 Days:   Cycles 1 through 6: A cycle is every 21 days:     Dostarlimab-gxly      Paclitaxel      Carboplatin    Cycles 7 and beyond: A cycle is every 42 days:     Dostarlimab-gxly   **Always confirm dose/schedule in your pharmacy ordering system**  Patient Characteristics: Carcinosarcoma, Newly Diagnosed, Postoperative (Pathologic Staging), Stage III/IV, MSS/pMMR Histology: Carcinosarcoma Therapeutic Status: Newly Diagnosed, Postoperative (Pathologic Staging) AJCC M Category: cM0 AJCC 8 Stage Grouping: IIIC1 AJCC T Category: pT3 AJCC N Category: pN1 Microsatellite/Mismatch Repair Status: MSS/pMMR Intent of Therapy: Curative Intent, Discussed with Patient

## 2023-02-24 ENCOUNTER — Inpatient Hospital Stay (HOSPITAL_BASED_OUTPATIENT_CLINIC_OR_DEPARTMENT_OTHER): Payer: BC Managed Care – PPO | Admitting: Gynecologic Oncology

## 2023-02-24 ENCOUNTER — Encounter: Payer: Self-pay | Admitting: Hematology and Oncology

## 2023-02-24 ENCOUNTER — Other Ambulatory Visit: Payer: Self-pay

## 2023-02-24 ENCOUNTER — Encounter: Payer: Self-pay | Admitting: Gynecologic Oncology

## 2023-02-24 VITALS — BP 136/68 | HR 60 | Temp 98.9°F | Resp 20 | Wt 184.8 lb

## 2023-02-24 DIAGNOSIS — Z7189 Other specified counseling: Secondary | ICD-10-CM

## 2023-02-24 DIAGNOSIS — C541 Malignant neoplasm of endometrium: Secondary | ICD-10-CM

## 2023-02-24 NOTE — Telephone Encounter (Signed)
Treatment scheduled

## 2023-02-24 NOTE — Patient Instructions (Addendum)
It was good to see you today.  You are healing very well from surgery!  Please remember, no heavy lifting for at least 6 weeks and nothing in the vagina for at least 10-12.  I will see you back for follow-up once you have finished treatment.

## 2023-02-25 ENCOUNTER — Encounter: Payer: Self-pay | Admitting: Gynecologic Oncology

## 2023-02-25 NOTE — Progress Notes (Signed)
GYN Location of Tumor / Histology: Endometrial  Danielle Mullins presented with symptoms of: Post menopausal bleeding  Biopsies revealed:    Past/Anticipated interventions by Gyn/Onc surgery, if any: Dr. Pricilla Holm 02/03/23    Past/Anticipated interventions by medical oncology, if any:    Weight changes, if any: {:18581}  Bowel/Bladder complaints, if any: {yes no:[id number]}, {Blank single:19197::"diarrhea","constipation","urinary frequency","burning","trouble emptying bladder"," "}  Nausea/Vomiting, if any: {:18581}  Pain issues, if any:  {:18581}  SAFETY ISSUES: Prior radiation? {:18581} Pacemaker/ICD? {:18581} Possible current pregnancy? {:18581} Is the patient on methotrexate? {:18581}  Current Complaints / other details:  ***

## 2023-02-26 NOTE — Progress Notes (Signed)
Radiation Oncology         [phone] ________________________________  Initial Outpatient Consultation  Name: Danielle Mullins MRN: [id number]  Date: 02/28/2023  DOB: 10/12/1960  CC:[name], FNP  Carver Fila, MD   REFERRING PHYSICIAN: Carver Fila, MD  DIAGNOSIS: There were no encounter diagnoses.  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 female who is accompanied by ***. she is seen as a courtesy of Dr. Pricilla Holm for an opinion concerning radiation therapy as part of management for her recently diagnosed endometrial cancer.   The patient initially developed light vaginal bleeding, discharge, and fatigue this past October 2024. She reported these symptoms to her PCP who arranged for a pelvic/transvaginal US on 12/14/22 which demonstrated: an enlarged and heterogeneous postmenopausal uterus with multiple fibroids, and an indeterminate echogenic 2.4 cm ovoid lesion within the mid uterus. (Bilateral ovaries were not visible).   She later presented to the ED on 01/11/23 with c/o persistent yellowish/orange vaginal discharge, and chills. ED work-up showed concern for AKI with creatinine of 1.7. An ultrasound of the pelvis was subsequently performed which revealed: a 7.8 cm hypervascular mass within the lower uterine segment, extending  toward the cervix.   She was accordingly evaluated by gynecology and admitted for further management. A biopsy of the cervical mass was obtained on her date of admission which showed findings consistent with leiomyosarcoma (with a differential diagnosis of carcinosarcoma).   An MRI of the pelvis was then performed which demonstrated a 12.6 centimeter endocervical mass extending to the fundus, and evidence of surrounding lymphadenopathy. The remainder of her hospital course included broad-spectrum abx, and she was instructed to continue on antibiotics at discharge on 01/14/23.   After being discharged, she accordingly met with Dr. Pricilla Holm on 01/21/23 to discuss treatment options. During that visit, the patient also endorsed decreased appetite for several months, correlated with a weight loss of approximately 20 lbs. GU exam performed at that time (although limited due to poor tolerance) also noted a mass presumably prolapsing through the cervix and filling the vagina (but without direct extension or involvement of the vagina along her distal vagina).    To complete her staging work-up, a chest and abdominal CT were both performed on 01/27/23 and collectively demonstrated: multiple small nonspecific/indeterminate bilateral pulmonary nodules measuring up to 4 mm. CT otherwise showed no definite evidence of metastatic disease in abdomen or chest. (The bilateral pulmonary nodules warrant close attention to follow-up, as metastatic disease can not be entirely excluded).   Based on Dr. Everitt Amber recommendation, she opted to proceed with a robotic-assisted laparoscopic total hysterectomy, BSO, and bilateral pelvic lymph node excisions on 02/03/23 under the care of Dr. Pricilla Holm. 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   -- Nodal status of 3/6 pelvic lymph node excisions positive for metastatic carcinoma, with extensive perinodal intralymphatic tumor present in 2/3 positive node (left external iliac LN's).  -- The anterior and posterior vaginal margins were also excised and were positive for carcinosarcoma.  -- Pelvic washings were also submitted for cytology and came back positive for adenocarcinoma.  -- Of note: At the time of her procedure, the cervix was enlarged and dilated secondary to the prolapsing mass, which resulted in additional OR time and partial ureterolysis.   In the setting of her high-risk disease and nodal involvement, Dr. Pricilla Holm has recommended adjuvant chemotherapy and vaginal brachytherapy which we will discuss in detail today. Her case was also presented at the gyn-onc tumor board held on 02/14/23. Disposition concluded concurs with Dr. Winferd Humphrey recommendation.    She has been seen in consultation by Dr. Bertis Ruddy, and has made an informed decision to proceed with adjuvant chemotherapy consisting of  carboplatin & Taxol & Dostarlimab x 6 cycles, followed by maintenance Dostarlimab q6 weeks for up to 2 years.   PREVIOUS RADIATION THERAPY: No  PAST MEDICAL HISTORY:  Past Medical History:  Diagnosis Date   Arthritis    Diabetes mellitus without complication (HCC)    Fibroids    Heart disease, unspecified e   enlarged heart chamber- patient has referral   Hypertension    Ingrown toenail 01/13/2015   Mobitz type 2 second degree AV block 03/24/2020   Plantar fasciitis 01/13/2015   Porokeratosis 01/13/2015   Sesamoiditis 01/13/2015    Sleep apnea    Symptomatic bradycardia 03/24/2020    PAST SURGICAL HISTORY: Past Surgical History:  Procedure Laterality Date   LYMPH NODE DISSECTION N/A 02/03/2023   Procedure: PELVIC LYMPH NODE DISSECTION;  Surgeon: Carver Fila, MD;  Location: [facility];  Service: Gynecology;  Laterality: N/A;   ROBOTIC ASSISTED TOTAL HYSTERECTOMY WITH BILATERAL SALPINGO OOPHERECTOMY Bilateral 02/03/2023   Procedure: DIAGNOSTIC LAPAROSCOPY  XI ROBOTIC ASSISTED TOTAL HYSTERECTOMY WITH BILATERAL SALPINGO OOPHORECTOMY, CYSTOSCOPY, MINI LAPAROTOMY;  Surgeon: Carver Fila, MD;  Location: [facility];  Service: Gynecology;  Laterality: Bilateral;    FAMILY HISTORY:  Family History  Problem Relation Age of Onset   Diabetes Mother    Hypertension Mother    Heart disease Father    Hypertension Father    Diabetes Brother    Breast cancer Neg Hx    Prostate cancer Neg Hx    Colon cancer Neg Hx    Endometrial cancer Neg Hx    Ovarian cancer Neg Hx    Pancreatic cancer Neg Hx    Cancer - Ovarian Neg Hx     SOCIAL HISTORY:  Social History   Tobacco Use   Smoking status: Never   Smokeless tobacco: Never  Vaping Use   Vaping status: Never Used  Substance Use Topics   Alcohol use: No    Alcohol/week: 0.0 standard drinks of alcohol   Drug use: No    ALLERGIES:  Allergies  Allergen Reactions   Celecoxib Swelling    MEDICATIONS:  Current Outpatient Medications  Medication Sig Dispense Refill   dexamethasone (DECADRON) 4 MG tablet Take 2 tabs at the night before and 2 tab the morning of chemotherapy, every  3 weeks, by mouth x 6 cycles 24 tablet 6   ezetimibe (ZETIA) 10 MG tablet Take 1 tablet (10 mg total) by mouth daily. 90 tablet 0   gabapentin (NEURONTIN) 100 MG capsule Take 100 mg by mouth at bedtime as needed (nerve pain).     lidocaine-prilocaine (EMLA) cream Apply to affected area once 30 g 3   lisinopril (ZESTRIL) 10 MG tablet Take 10 mg by mouth daily.     metFORMIN (GLUCOPHAGE)  500 MG tablet Take 500 mg by mouth 2 (two) times daily with a meal.     ondansetron (ZOFRAN) 8 MG tablet Take 1 tablet (8 mg total) by mouth every 8 (eight) hours as needed for nausea or vomiting. Start on the third day after chemotherapy. 30 tablet 1   oxyCODONE (ROXICODONE) 5 MG immediate release tablet Take 1 tablet (5 mg total) by mouth every 4 (four) hours as needed for severe pain (pain score 7-10). 15 tablet 0   prochlorperazine (COMPAZINE) 10 MG tablet Take 1 tablet (10 mg total) by mouth every 6 (six) hours as needed for nausea or vomiting. 30 tablet 1   RYBELSUS 7 MG TABS Take 1 tablet by mouth daily.     simvastatin (ZOCOR) 40 MG tablet Take 40 mg by mouth at bedtime.     No current facility-administered medications for this encounter.    REVIEW OF SYSTEMS:  A 10+ POINT REVIEW OF SYSTEMS WAS OBTAINED including neurology, dermatology, psychiatry, cardiac, respiratory, lymph, extremities, GI, GU, musculoskeletal, constitutional, reproductive, HEENT. ***   PHYSICAL EXAM:  vitals were not taken for this visit.   General: Alert and oriented, in no acute distress HEENT: Head is normocephalic. Extraocular movements are intact. Oropharynx is clear. Neck: Neck is supple, no palpable cervical or supraclavicular lymphadenopathy. Heart: Regular in rate and rhythm with no murmurs, rubs, or gallops. Chest: Clear to auscultation bilaterally, with no rhonchi, wheezes, or rales. Abdomen: Soft, nontender, nondistended, with no rigidity or guarding. Extremities: No cyanosis or edema. Lymphatics: see Neck Exam Skin: No concerning lesions. Musculoskeletal: symmetric strength and muscle tone throughout. Neurologic: Cranial nerves II through XII are grossly intact. No obvious focalities. Speech is fluent. Coordination is intact. Psychiatric: Judgment and insight are intact. Affect is appropriate.  On pelvic examination the external genitalia were unremarkable. A speculum exam was performed. There are  no mucosal lesions noted in the vaginal vault. A Pap smear was obtained of the proximal vagina. On bimanual and rectovaginal examination there were no pelvic masses appreciated. ***   ECOG = ***  0 - Asymptomatic (Fully active, able to carry on all predisease activities without restriction)  1 - Symptomatic but completely ambulatory (Restricted in physically strenuous activity but ambulatory and able to carry out work of a light or sedentary nature. For example, light housework, office work)  2 - Symptomatic, <50% in bed during the day (Ambulatory and capable of all self care but unable to carry out any work activities. Up and about more than 50% of waking hours)  3 - Symptomatic, >50% in bed, but not bedbound (Capable of only limited self-care, confined to bed or chair 50% or more of waking hours)  4 - Bedbound (Completely disabled. Cannot carry on any self-care. Totally confined to bed or chair)  5 - Death   Santiago Glad MM, Creech RH, Tormey DC, et al. [phone]). "Toxicity and response criteria of the [facility]". Am. Evlyn Clines. Oncol. 5 (6): 649-55  LABORATORY DATA:  Lab Results  Component  Value Date   WBC 6.6 02/05/2023   HGB 8.3 (L) 02/05/2023   HCT 25.6 (L) 02/05/2023   MCV 77.8 (L) 02/05/2023   PLT 325 02/05/2023   NEUTROABS 7.7 01/11/2023   Lab Results  Component Value Date   NA 137 02/05/2023   K 3.6 02/05/2023   CL 107 02/05/2023   CO2 20 (L) 02/05/2023   GLUCOSE 116 (H) 02/05/2023   BUN 18 02/05/2023   CREATININE 0.95 02/05/2023   CALCIUM 8.2 (L) 02/05/2023      RADIOGRAPHY: CT Chest W Contrast Result Date: 02/03/2023 CLINICAL DATA:  Metastatic disease evaluation, leiomyosarcoma, endometrial cancer * Tracking Code: BO * EXAM: CT CHEST AND ABDOMEN WITH CONTRAST TECHNIQUE: Multidetector CT imaging of the chest and abdomen was performed following the standard protocol during bolus administration of intravenous contrast. RADIATION DOSE REDUCTION: This exam  was performed according to the departmental dose-optimization program which includes automated exposure control, adjustment of the mA and/or kV according to patient size and/or use of iterative reconstruction technique. CONTRAST:  OMNIPAQUE IOHEXOL 300 MG/ML SOLN additional oral enteric contrast COMPARISON:  MR pelvis, 01/13/2023 FINDINGS: CT CHEST FINDINGS Cardiovascular: No significant vascular findings. Normal heart size. No pericardial effusion. Mediastinum/Nodes: No enlarged mediastinal, hilar, or axillary lymph nodes. Thyroid gland, trachea, and esophagus demonstrate no significant findings. Lungs/Pleura: Small fissural nodules of the left lower lobe measuring up to 0.4 cm (series 7, image 71). Multiple small bilateral pulmonary nodules, for example a 0.3 cm subpleural nodule of the lateral segment right middle lobe (series 7, image 89) and a 0.3 nodule of the dependent left lower lobe (series 7, image 97). No pleural effusion or pneumothorax. Musculoskeletal: No chest wall abnormality. No acute osseous findings. CT ABDOMEN FINDINGS Hepatobiliary: Low-attenuation lesion of the left lobe of the liver measuring 0.5 cm, likely a tiny cyst or hemangioma (series 2, image 16). Hepatic steatosis. Focal fatty deposition adjacent to the falciform ligament, characteristic in location and appearance (series 2, image 43). No gallstones, gallbladder wall thickening, or biliary dilatation. Pancreas: Unremarkable. No pancreatic ductal dilatation or surrounding inflammatory changes. Spleen: Normal in size without significant abnormality. Adrenals/Urinary Tract: Adrenal glands are unremarkable. Kidneys are normal, without renal calculi, solid lesion, or hydronephrosis. Stomach/Bowel: Stomach is within normal limits. Appendix appears normal. No evidence of bowel wall thickening, distention, or inflammatory changes. Vascular/Lymphatic: Aortic atherosclerosis. No enlarged abdominal lymph nodes. Other: No abdominal wall  hernia or abnormality. No ascites. Musculoskeletal: No acute osseous findings. IMPRESSION: 1. Multiple small bilateral pulmonary nodules measuring up to 0.4 cm, nonspecific. These may be infectious or inflammatory although warrant close attention on follow-up as early pulmonary metastatic disease is not excluded. 2. No evidence of lymphadenopathy or metastatic disease in the abdomen. 3. Hepatic steatosis. Aortic Atherosclerosis (ICD10-I70.0). Electronically Signed   By: Jearld Lesch M.D.   On: 02/03/2023 08:13   CT ABDOMEN W CONTRAST Result Date: 02/03/2023 CLINICAL DATA:  Metastatic disease evaluation, leiomyosarcoma, endometrial cancer * Tracking Code: BO * EXAM: CT CHEST AND ABDOMEN WITH CONTRAST TECHNIQUE: Multidetector CT imaging of the chest and abdomen was performed following the standard protocol during bolus administration of intravenous contrast. RADIATION DOSE REDUCTION: This exam was performed according to the departmental dose-optimization program which includes automated exposure control, adjustment of the mA and/or kV according to patient size and/or use of iterative reconstruction technique. CONTRAST:  OMNIPAQUE IOHEXOL 300 MG/ML SOLN additional oral enteric contrast COMPARISON:  MR pelvis, 01/13/2023 FINDINGS: CT CHEST FINDINGS Cardiovascular: No significant vascular findings. Normal heart  size. No pericardial effusion. Mediastinum/Nodes: No enlarged mediastinal, hilar, or axillary lymph nodes. Thyroid gland, trachea, and esophagus demonstrate no significant findings. Lungs/Pleura: Small fissural nodules of the left lower lobe measuring up to 0.4 cm (series 7, image 71). Multiple small bilateral pulmonary nodules, for example a 0.3 cm subpleural nodule of the lateral segment right middle lobe (series 7, image 89) and a 0.3 nodule of the dependent left lower lobe (series 7, image 97). No pleural effusion or pneumothorax. Musculoskeletal: No chest wall abnormality. No acute osseous findings.  CT ABDOMEN FINDINGS Hepatobiliary: Low-attenuation lesion of the left lobe of the liver measuring 0.5 cm, likely a tiny cyst or hemangioma (series 2, image 16). Hepatic steatosis. Focal fatty deposition adjacent to the falciform ligament, characteristic in location and appearance (series 2, image 43). No gallstones, gallbladder wall thickening, or biliary dilatation. Pancreas: Unremarkable. No pancreatic ductal dilatation or surrounding inflammatory changes. Spleen: Normal in size without significant abnormality. Adrenals/Urinary Tract: Adrenal glands are unremarkable. Kidneys are normal, without renal calculi, solid lesion, or hydronephrosis. Stomach/Bowel: Stomach is within normal limits. Appendix appears normal. No evidence of bowel wall thickening, distention, or inflammatory changes. Vascular/Lymphatic: Aortic atherosclerosis. No enlarged abdominal lymph nodes. Other: No abdominal wall hernia or abnormality. No ascites. Musculoskeletal: No acute osseous findings. IMPRESSION: 1. Multiple small bilateral pulmonary nodules measuring up to 0.4 cm, nonspecific. These may be infectious or inflammatory although warrant close attention on follow-up as early pulmonary metastatic disease is not excluded. 2. No evidence of lymphadenopathy or metastatic disease in the abdomen. 3. Hepatic steatosis. Aortic Atherosclerosis (ICD10-I70.0). Electronically Signed   By: Jearld Lesch M.D.   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  We discussed the natural history of *** and  general treatment, highlighting the role of radiotherapy in the management.  We discussed the available radiation techniques, and focused on the details of logistics and delivery.  We reviewed the anticipated acute and late sequelae associated with radiation in this setting.  The patient was encouraged to ask questions that I answered to the best of my ability. *** A patient consent form was discussed and signed.  We retained a copy for our records.  The patient would like to proceed with radiation and will be scheduled for CT simulation.  PLAN: ***    *** minutes of total time was spent for this patient encounter, including preparation, face-to-face counseling with the patient and coordination of care, physical exam, and documentation of the encounter.   ------------------------------------------------  Billie Lade, PhD, MD  This document serves as a record of services personally performed by Antony Blackbird, MD. It was created on his behalf by Neena Rhymes, a trained medical scribe. The creation of this record is based on the scribe's personal observations and the provider's statements to them. This document has been checked and approved by the attending provider.

## 2023-02-28 ENCOUNTER — Ambulatory Visit
Admission: RE | Admit: 2023-02-28 | Discharge: 2023-02-28 | Disposition: A | Discharge status: Home / Self Care | Payer: BC Managed Care – PPO | Source: Ambulatory Visit | Attending: Radiation Oncology | Admitting: Radiation Oncology

## 2023-02-28 ENCOUNTER — Encounter: Payer: Self-pay | Admitting: Radiation Oncology

## 2023-02-28 ENCOUNTER — Telehealth: Payer: Self-pay | Admitting: Hematology and Oncology

## 2023-02-28 VITALS — BP 135/79 | HR 74 | Temp 97.3°F | Resp 18 | Ht 66.0 in | Wt 182.0 lb

## 2023-02-28 DIAGNOSIS — Z7984 Long term (current) use of oral hypoglycemic drugs: Secondary | ICD-10-CM | POA: Diagnosis not present

## 2023-02-28 DIAGNOSIS — C541 Malignant neoplasm of endometrium: Secondary | ICD-10-CM | POA: Diagnosis present

## 2023-02-28 DIAGNOSIS — Z79899 Other long term (current) drug therapy: Secondary | ICD-10-CM | POA: Insufficient documentation

## 2023-02-28 DIAGNOSIS — R918 Other nonspecific abnormal finding of lung field: Secondary | ICD-10-CM | POA: Insufficient documentation

## 2023-02-28 DIAGNOSIS — I10 Essential (primary) hypertension: Secondary | ICD-10-CM | POA: Insufficient documentation

## 2023-02-28 DIAGNOSIS — M129 Arthropathy, unspecified: Secondary | ICD-10-CM | POA: Diagnosis not present

## 2023-02-28 DIAGNOSIS — G4730 Sleep apnea, unspecified: Secondary | ICD-10-CM | POA: Diagnosis not present

## 2023-02-28 DIAGNOSIS — E119 Type 2 diabetes mellitus without complications: Secondary | ICD-10-CM | POA: Insufficient documentation

## 2023-02-28 DIAGNOSIS — M25552 Pain in left hip: Secondary | ICD-10-CM | POA: Insufficient documentation

## 2023-02-28 DIAGNOSIS — Z90710 Acquired absence of both cervix and uterus: Secondary | ICD-10-CM | POA: Insufficient documentation

## 2023-02-28 NOTE — Telephone Encounter (Signed)
Danielle Mullins

## 2023-03-01 ENCOUNTER — Inpatient Hospital Stay: Payer: BC Managed Care – PPO

## 2023-03-01 ENCOUNTER — Other Ambulatory Visit: Payer: Self-pay

## 2023-03-01 ENCOUNTER — Other Ambulatory Visit: Payer: Self-pay | Admitting: Radiology

## 2023-03-02 ENCOUNTER — Ambulatory Visit (HOSPITAL_COMMUNITY): Payer: BC Managed Care – PPO

## 2023-03-03 ENCOUNTER — Other Ambulatory Visit: Payer: Self-pay | Admitting: Radiology

## 2023-03-03 NOTE — H&P (Signed)
Chief Complaint: Patient was seen in consultation today for endometrial cancer ; referred for port a  cath placement to assist with treatment    Referring Physician(s): Gorsuch,Ni  Supervising Physician: Simonne Come  Patient Status: [facility] - Out-pt  History of Present Illness: Danielle Mullins is a 63 y.o. female with a history of HTN, HLD, OSA, and spondylosis who presents for a port placement. Patient initially presented to the [facility] ED in December 2024 d/t abnormal vaginal discharge and associated chills. GYN workup significant for uterine mass. 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 She underwent a total hysterectomy with bilateral salpingoophorectomy and bilateral pelvic lymph node dissection on 02/03/23 with Dr. Eugene Garnet. She is following with Dr. Artis Delay with oncology and is set to start chemo on 03/10/23. Referred to IR for port placement.     Code Status: Full Code  Past Medical History:  Diagnosis Date   Arthritis    Diabetes mellitus without complication (HCC)    Fibroids    Heart disease, unspecified e   enlarged heart chamber- patient has referral   Hypertension    Ingrown toenail 01/13/2015   Mobitz type 2 second degree AV block 03/24/2020   Plantar fasciitis 01/13/2015   Porokeratosis 01/13/2015   Sesamoiditis 01/13/2015   Sleep apnea    Symptomatic bradycardia 03/24/2020    Past Surgical History:  Procedure Laterality Date   LYMPH NODE DISSECTION N/A 02/03/2023   Procedure: PELVIC LYMPH NODE DISSECTION;  Surgeon: Carver Fila, MD;  Location: [facility] ORS;  Service: Gynecology;  Laterality: N/A;   ROBOTIC ASSISTED TOTAL HYSTERECTOMY WITH BILATERAL SALPINGO OOPHERECTOMY Bilateral 02/03/2023   Procedure: DIAGNOSTIC LAPAROSCOPY  XI ROBOTIC ASSISTED TOTAL HYSTERECTOMY WITH BILATERAL SALPINGO OOPHORECTOMY, CYSTOSCOPY, MINI LAPAROTOMY;  Surgeon: Carver Fila, MD;  Location: [facility] ORS;   Service: Gynecology;  Laterality: Bilateral;    Allergies: Celecoxib  Medications: Prior to Admission medications   Medication Sig Start Date End Date Taking? Authorizing Provider  dexamethasone (DECADRON) 4 MG tablet Take 2 tabs at the night before and 2 tab the morning of chemotherapy, every 3 weeks, by mouth x 6 cycles 02/23/23   Artis Delay, MD  ezetimibe (ZETIA) 10 MG tablet Take 1 tablet (10 mg total) by mouth daily. 01/28/23 04/28/23  Yates Decamp, MD  gabapentin (NEURONTIN) 100 MG capsule Take 100 mg by mouth at bedtime as needed (nerve pain).    [provider]  lidocaine-prilocaine (EMLA) cream Apply to affected area once 02/23/23   Artis Delay, MD  lisinopril (ZESTRIL) 10 MG tablet Take 10 mg by mouth daily.    [provider]  metFORMIN (GLUCOPHAGE) 500 MG tablet Take 500 mg by mouth 2 (two) times daily with a meal.    [provider]  ondansetron (ZOFRAN) 8 MG tablet Take 1 tablet (8 mg total) by mouth every 8 (eight) hours as needed for nausea or vomiting. Start on the third day after chemotherapy. 02/23/23   Artis Delay, MD  oxyCODONE (ROXICODONE) 5 MG immediate release tablet Take 1 tablet (5 mg total) by mouth every 4 (four) hours as needed for severe pain (pain score 7-10). Patient not taking: Reported on 02/28/2023 02/05/23   Carver Fila, MD  prochlorperazine (COMPAZINE) 10 MG tablet Take 1 tablet (10 mg total) by mouth every 6 (six) hours as needed for nausea or vomiting. 02/23/23   Artis Delay, MD  RYBELSUS 7 MG TABS Take 1 tablet by  mouth daily. 12/24/22   [provider]  simvastatin (ZOCOR) 40 MG tablet Take 40 mg by mouth at bedtime. 03/14/20   [provider]     Family History  Problem Relation Age of Onset   Diabetes Mother    Hypertension Mother    Heart disease Father    Hypertension Father    Diabetes Brother    Breast cancer Neg Hx    Prostate cancer Neg Hx    Colon cancer Neg Hx    Endometrial cancer Neg Hx     Ovarian cancer Neg Hx    Pancreatic cancer Neg Hx    Cancer - Ovarian Neg Hx     Social History   Socioeconomic History   Marital status: Divorced    Spouse name: Not on file   Number of children: 0   Years of education: Not on file   Highest education level: Not on file  Occupational History   Not on file  Tobacco Use   Smoking status: Never   Smokeless tobacco: Never  Vaping Use   Vaping status: Never Used  Substance and Sexual Activity   Alcohol use: No    Alcohol/week: 0.0 standard drinks of alcohol   Drug use: No   Sexual activity: Not Currently  Other Topics Concern   Not on file  Social History Narrative   Not on file   Social Drivers of Health   Financial Resource Strain: Not on file  Food Insecurity: No Food Insecurity (02/28/2023)   Hunger Vital Sign    Worried About Running Out of Food in the Last Year: Never true    Ran Out of Food in the Last Year: Never true  Transportation Needs: No Transportation Needs (02/28/2023)   PRAPARE - Administrator, Civil Service (Medical): No    Lack of Transportation (Non-Medical): No  Physical Activity: Not on file  Stress: Not on file  Social Connections: Not on file    ROS:  Denies any N/V, chest pain, shortness of breath, fevers/chills. All other ROS negative.  Vital Signs: pending  PE: awake/alert; chest- CTA bilat; heart- RRR; abd-soft,+BS,NT; no sig LE edema     Imaging: No results found.  Labs:  CBC: Recent Labs    01/24/23 1431 02/04/23 0332 02/04/23 1809 02/05/23 0422  WBC 4.8 7.5 7.4 6.6  HGB 9.1* 7.7* 8.6* 8.3*  HCT 29.6* 24.7* 27.4* 25.6*  PLT 480* 330 337 325    COAGS: Recent Labs    01/12/23 0750  INR 1.3*    BMP: Recent Labs    02/04/23 0332 02/04/23 1809 02/05/23 0422 02/05/23 1355  NA 131* 133* 136 137  K 4.6 3.6 3.5 3.6  CL 98 101 104 107  CO2 23 21* 22 20*  GLUCOSE 194* 148* 118* 116*  BUN 21 23 22 18   CALCIUM 8.3* 8.0* 7.8* 8.2*  CREATININE  1.67* 1.78* 1.39* 0.95  GFRNONAA 34* 32* 43* >60    LIVER FUNCTION TESTS: Recent Labs    01/11/23 1427 01/24/23 1431  BILITOT 0.9 0.3  AST 24 15  ALT 15 9  ALKPHOS 63 46  PROT 8.1 7.6  ALBUMIN 3.0* 3.0*    TUMOR MARKERS: No results for input(s): "AFPTM", "CEA", "CA199", "CHROMGRNA" in the last 8760 hours.  Assessment and Plan:  63 y.o. female with a history of HTN, HLD, OSA, and spondylosis who presents for a port placement. Patient initially presented to the [facility] ED in December 2024 d/t abnormal vaginal  discharge and associated chills. GYN workup significant for uterine mass. 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 She underwent a total hysterectomy with bilateral salpingoophorectomy and bilateral pelvic lymph node dissection on 02/03/23 with Dr. Eugene Garnet. She is following with Dr. Artis Delay with oncology and is set to start chemo on 03/10/23. Referred to IR for port placement.   Plan for port-a-cath placement with Dr. Grace Isaac on 03/04/23  Risks and benefits of image guided port-a-catheter placement was discussed with the patient including, but not limited to bleeding, infection, pneumothorax, or fibrin sheath development and need for additional procedures.  All of the patient's questions were answered, patient is agreeable to proceed. Consent signed and in chart.   Thank you for this interesting consult. I greatly enjoyed meeting [address] and look forward to participating in their care. A copy of this report was sent to the requesting provider on this date.  Electronically Signed: Jama Flavors, PA-C/Kevin Paytyn Mesta,PA-C 03/03/2023, 1:51 PM   I spent a total of  15 Minutes in face to face clinical consultation, greater than 50% of which was counseling/coordinating care for port-a-cath placement.

## 2023-03-04 ENCOUNTER — Encounter (HOSPITAL_COMMUNITY): Payer: Self-pay

## 2023-03-04 ENCOUNTER — Ambulatory Visit (HOSPITAL_COMMUNITY)
Admission: RE | Admit: 2023-03-04 | Discharge: 2023-03-04 | Disposition: A | Discharge status: Home / Self Care | Payer: BC Managed Care – PPO | Source: Ambulatory Visit | Attending: Hematology and Oncology

## 2023-03-04 DIAGNOSIS — Z90710 Acquired absence of both cervix and uterus: Secondary | ICD-10-CM | POA: Diagnosis not present

## 2023-03-04 DIAGNOSIS — Z90722 Acquired absence of ovaries, bilateral: Secondary | ICD-10-CM | POA: Diagnosis not present

## 2023-03-04 DIAGNOSIS — C541 Malignant neoplasm of endometrium: Secondary | ICD-10-CM | POA: Diagnosis present

## 2023-03-04 HISTORY — PX: IR IMAGING GUIDED PORT INSERTION: IMG5740

## 2023-03-04 LAB — GLUCOSE, CAPILLARY: Glucose-Capillary: 100 mg/dL — ABNORMAL HIGH (ref 70–99)

## 2023-03-04 MED ORDER — LIDOCAINE-EPINEPHRINE 1 %-1:100000 IJ SOLN
20.0000 mL | Freq: Once | INTRAMUSCULAR | Status: AC
  Administered 2023-03-04: 20 mL via INTRADERMAL

## 2023-03-04 MED ORDER — HEPARIN SOD (PORK) LOCK FLUSH 100 UNIT/ML IV SOLN
INTRAVENOUS | Status: AC
  Filled 2023-03-04: qty 5

## 2023-03-04 MED ORDER — FENTANYL CITRATE (PF) 100 MCG/2ML IJ SOLN
INTRAMUSCULAR | Status: AC
  Filled 2023-03-04: qty 2

## 2023-03-04 MED ORDER — LIDOCAINE-EPINEPHRINE 1 %-1:100000 IJ SOLN
INTRAMUSCULAR | Status: AC
  Filled 2023-03-04: qty 1

## 2023-03-04 MED ORDER — MIDAZOLAM HCL 2 MG/2ML IJ SOLN
INTRAMUSCULAR | Status: AC | PRN
  Administered 2023-03-04 (×2): 1 mg via INTRAVENOUS

## 2023-03-04 MED ORDER — HEPARIN SOD (PORK) LOCK FLUSH 100 UNIT/ML IV SOLN
500.0000 [IU] | Freq: Once | INTRAVENOUS | Status: AC
  Administered 2023-03-04: 500 [IU] via INTRAVENOUS

## 2023-03-04 MED ORDER — FENTANYL CITRATE (PF) 100 MCG/2ML IJ SOLN
INTRAMUSCULAR | Status: AC | PRN
  Administered 2023-03-04 (×2): 50 ug via INTRAVENOUS

## 2023-03-04 MED ORDER — MIDAZOLAM HCL 2 MG/2ML IJ SOLN
INTRAMUSCULAR | Status: AC
  Filled 2023-03-04: qty 2

## 2023-03-04 MED ORDER — SODIUM CHLORIDE 0.9 % IV SOLN: INTRAVENOUS | Status: DC

## 2023-03-04 NOTE — Discharge Instructions (Signed)
 Implanted Port Insertion, Care After  The following information offers guidance on how to care for yourself after your procedure. Your health care provider may also give you more specific instructions. If you have problems or questions, contact your health care provider.  What can I expect after the procedure? After the procedure, it is common to have: Discomfort at the port insertion site. Bruising on the skin over the port. This should improve over 3-4 days.   Urgent needs - Interventional Radiology, clinic [phone] (mon-fri 8-5).   Wound - May remove dressing and shower in 24 to 48 hours.  Keep site clean and dry.  Replace with bandaid as needed.  Do not submerge in tub or water until site healing well. If closed with glue, glue will flake off on its own.   If ordered by your provider, may start Emla cream (or any other creams ointments or lotions) in 2 weeks or after incision is healed. Port is ready for use immediately.   After completion of treatment, your provider should have you set up for monthly port flushes.   Follow these instructions at home: [facility] care After your port is placed, you will get a manufacturer's information card. The card has information about your port. Keep this card with you at all times. Take care of the port as told by your health care provider. Ask your health care provider if you or a family member can get training for taking care of the port at home. A home health care nurse will be be available to help care for the port. Make sure to remember what type of port you have. Incision care     Follow instructions from your health care provider about how to take care of your port insertion site. Make sure you: Wash your hands with soap and water for at least 20 seconds before and after you change your bandage (dressing). If soap and water are not available, use hand sanitizer. Change your dressing as told by your health care provider. Leave stitches  (sutures), skin glue, or adhesive strips in place. These skin closures may need to stay in place for 2 weeks or longer. If adhesive strip edges start to loosen and curl up, you may trim the loose edges. Do not remove adhesive strips completely unless your health care provider tells you to do that. Check your port insertion site every day for signs of infection. Check for: Redness, swelling, or pain. Fluid or blood. Warmth. Pus or a bad smell. Activity Return to your normal activities as told by your health care provider. Ask your health care provider what activities are safe for you. You may have to avoid lifting. Ask your health care provider how much you can safely lift. General instructions Take over-the-counter and prescription medicines only as told by your health care provider. Do not take baths, swim, or use a hot tub until your health care provider approves. Ask your health care provider if you may take showers. You may only be allowed to take sponge baths. If you were given a sedative during the procedure, it can affect you for several hours. Do not drive or operate machinery until your health care provider says that it is safe. Wear a medical alert bracelet in case of an emergency. This will tell any health care providers that you have a port. Keep all follow-up visits. This is important. Contact a health care provider if: You cannot flush your port with saline as directed, or you cannot  draw blood from the port. You have a fever or chills. You have redness, swelling, or pain around your port insertion site. You have fluid or blood coming from your port insertion site. Your port insertion site feels warm to the touch. You have pus or a bad smell coming from the port insertion site. Get help right away if: You have chest pain or shortness of breath. You have bleeding from your port that you cannot control. These symptoms may be an emergency. Get help right away. Call 911. Do not  wait to see if the symptoms will go away. Do not drive yourself to the hospital. Summary Take care of the port as told by your health care provider. Keep the manufacturer's information card with you at all times. Change your dressing as told by your health care provider. Contact a health care provider if you have a fever or chills or if you have redness, swelling, or pain around your port insertion site. Keep all follow-up visits. This information is not intended to replace advice given to you by your health care provider. Make sure you discuss any questions you have with your health care provider. Document Revised: 07/22/2020 Document Reviewed: 07/22/2020 Elsevier Patient Education  2023 Else[employer] Inc[employer]    Moderate Conscious Sedation  Adult  Care After (English)  After the procedure, it is common to have: Sleepiness for a few hours. Impaired judgment for a few hours. Trouble with balance. Nausea or vomiting if you eat too soon. Follow these instructions at home: For the time period you were told by your health care provider:  Rest. Do not participate in activities where you could fall or become injured. Do not drive or use machinery. Do not drink alcohol. Do not take sleeping pills or medicines that cause drowsiness. Do not make important decisions or sign legal documents. Do not take care of children on your own. Eating and drinking Follow instructions from your health care provider about what you may eat and drink. Drink enough fluid to keep your urine pale yellow. If you vomit: Drink clear fluids slowly and in small amounts as you are able. Clear fluids include water, ice chips, low-calorie sports drinks, and fruit juice that has water added to it (diluted fruit juice). Eat light and bland foods in small amounts as you are able. These foods include bananas, applesauce, rice, lean meats, toast, and crackers. General instructions Take over-the-counter and prescription medicines  only as told by your health care provider. Have a responsible adult stay with you for the time you are told. Do not use any products that contain nicotine or tobacco. These products include cigarettes, chewing tobacco, and vaping devices, such as e-cigarettes. If you need help quitting, ask your health care provider. Return to your normal activities as told by your health care provider. Ask your health care provider what activities are safe for you. Your health care provider may give you more instructions. Make sure you know what you can and cannot do. Contact a health care provider if: You are still sleepy or having trouble with balance after 24 hours. You feel light-headed. You vomit every time you eat or drink. You get a rash. You have a fever. You have redness or swelling around the IV site. Get help right away if: You have trouble breathing. You start to feel confused at home. These symptoms may be an emergency. Get help right away. Call 911. Do not wait to see if the symptoms will go away. Do not  drive yourself to the hospital. This information is not intended to replace advice given to you by your health care provider. Make sure you discuss any questions you have with your health care provider.

## 2023-03-04 NOTE — Progress Notes (Signed)
 Pharmacist Chemotherapy Monitoring - Initial Assessment    Anticipated start date: 03/11/23   The following has been reviewed per standard work regarding the patient's treatment regimen: The patient's diagnosis, treatment plan and drug doses, and organ/hematologic function Lab orders and baseline tests specific to treatment regimen  The treatment plan start date, drug sequencing, and pre-medications Prior authorization status  Patient's documented medication list, including drug-drug interaction screen and prescriptions for anti-emetics and supportive care specific to the treatment regimen The drug concentrations, fluid compatibility, administration routes, and timing of the medications to be used The patient's access for treatment and lifetime cumulative dose history, if applicable  The patient's medication allergies and previous infusion related reactions, if applicable   Changes made to treatment plan:  N/A  Follow up needed:  Pending authorization for treatment    Corean Sever, PharmD PGY2 Oncology Pharmacy Resident   03/04/2023 12:34 PM

## 2023-03-07 ENCOUNTER — Telehealth: Payer: Self-pay

## 2023-03-07 NOTE — Telephone Encounter (Signed)
Returned her call. She is asking if she can use emla cream for port site on 2/7. Told her to wait 2 weeks from 1/31 insertion day. She verbalized understanding. She tried removing dressing from port site today and it looks like the tape irritated her skin. She did not remove all the dressing. Added note to use sensitive skin dressing over port site. She will try to send a picture via mychart of port site.  FYI

## 2023-03-07 NOTE — Telephone Encounter (Signed)
Returned her call and she is unable to send a picture of port due to being unable to log into mychart. She thinks that the port site may be irritated from recent insertion. Offered appt and she declined. She comes for education 2/5 and will have nurse look at port site on that day. FYI

## 2023-03-09 ENCOUNTER — Inpatient Hospital Stay: Payer: BC Managed Care – PPO

## 2023-03-09 ENCOUNTER — Inpatient Hospital Stay: Payer: BC Managed Care – PPO | Attending: Gynecologic Oncology

## 2023-03-09 DIAGNOSIS — C541 Malignant neoplasm of endometrium: Secondary | ICD-10-CM | POA: Insufficient documentation

## 2023-03-09 DIAGNOSIS — R519 Headache, unspecified: Secondary | ICD-10-CM | POA: Insufficient documentation

## 2023-03-09 DIAGNOSIS — R109 Unspecified abdominal pain: Secondary | ICD-10-CM | POA: Insufficient documentation

## 2023-03-09 DIAGNOSIS — Z79620 Long term (current) use of immunosuppressive biologic: Secondary | ICD-10-CM | POA: Insufficient documentation

## 2023-03-09 DIAGNOSIS — Z79899 Other long term (current) drug therapy: Secondary | ICD-10-CM | POA: Diagnosis not present

## 2023-03-09 DIAGNOSIS — Z5112 Encounter for antineoplastic immunotherapy: Secondary | ICD-10-CM | POA: Insufficient documentation

## 2023-03-09 DIAGNOSIS — Z5111 Encounter for antineoplastic chemotherapy: Secondary | ICD-10-CM | POA: Diagnosis present

## 2023-03-09 LAB — CBC WITH DIFFERENTIAL (CANCER CENTER ONLY)
Abs Immature Granulocytes: 0.01 10*3/uL (ref 0.00–0.07)
Basophils Absolute: 0 10*3/uL (ref 0.0–0.1)
Basophils Relative: 0 %
Eosinophils Absolute: 0.1 10*3/uL (ref 0.0–0.5)
Eosinophils Relative: 2 %
HCT: 32.6 % — ABNORMAL LOW (ref 36.0–46.0)
Hemoglobin: 9.9 g/dL — ABNORMAL LOW (ref 12.0–15.0)
Immature Granulocytes: 0 %
Lymphocytes Relative: 25 %
Lymphs Abs: 1 10*3/uL (ref 0.7–4.0)
MCH: 23.7 pg — ABNORMAL LOW (ref 26.0–34.0)
MCHC: 30.4 g/dL (ref 30.0–36.0)
MCV: 78.2 fL — ABNORMAL LOW (ref 80.0–100.0)
Monocytes Absolute: 0.4 10*3/uL (ref 0.1–1.0)
Monocytes Relative: 9 %
Neutro Abs: 2.6 10*3/uL (ref 1.7–7.7)
Neutrophils Relative %: 64 %
Platelet Count: 313 10*3/uL (ref 150–400)
RBC: 4.17 MIL/uL (ref 3.87–5.11)
RDW: 15.7 % — ABNORMAL HIGH (ref 11.5–15.5)
WBC Count: 4.1 10*3/uL (ref 4.0–10.5)
nRBC: 0 % (ref 0.0–0.2)

## 2023-03-09 LAB — CMP (CANCER CENTER ONLY)
ALT: <5 U/L (ref 0–44)
AST: 11 U/L — ABNORMAL LOW (ref 15–41)
Albumin: 4 g/dL (ref 3.5–5.0)
Alkaline Phosphatase: 65 U/L (ref 38–126)
Anion gap: 7 (ref 5–15)
BUN: 10 mg/dL (ref 8–23)
CO2: 30 mmol/L (ref 22–32)
Calcium: 9.3 mg/dL (ref 8.9–10.3)
Chloride: 102 mmol/L (ref 98–111)
Creatinine: 0.93 mg/dL (ref 0.44–1.00)
GFR, Estimated: >60 mL/min (ref >60)
Glucose, Bld: 91 mg/dL (ref 70–99)
Potassium: 3.1 mmol/L — ABNORMAL LOW (ref 3.5–5.1)
Sodium: 139 mmol/L (ref 135–145)
Total Bilirubin: 0.4 mg/dL (ref 0.0–1.2)
Total Protein: 8 g/dL (ref 6.5–8.1)

## 2023-03-09 LAB — TSH: TSH: 2.122 u[IU]/mL (ref 0.350–4.500)

## 2023-03-10 MED FILL — Fosaprepitant Dimeglumine For IV Infusion 150 MG (Base Eq): INTRAVENOUS | Qty: 5 | Status: AC

## 2023-03-11 ENCOUNTER — Encounter: Payer: Self-pay | Admitting: Hematology and Oncology

## 2023-03-11 ENCOUNTER — Inpatient Hospital Stay: Payer: BC Managed Care – PPO | Admitting: Physician Assistant

## 2023-03-11 ENCOUNTER — Inpatient Hospital Stay: Payer: BC Managed Care – PPO

## 2023-03-11 ENCOUNTER — Inpatient Hospital Stay: Payer: BC Managed Care – PPO | Admitting: Licensed Clinical Social Worker

## 2023-03-11 ENCOUNTER — Inpatient Hospital Stay (HOSPITAL_BASED_OUTPATIENT_CLINIC_OR_DEPARTMENT_OTHER): Payer: BC Managed Care – PPO | Admitting: Hematology and Oncology

## 2023-03-11 VITALS — BP 147/74 | HR 109 | Temp 97.7°F | Resp 18 | Ht 66.0 in | Wt 177.6 lb

## 2023-03-11 VITALS — BP 114/62 | HR 80 | Resp 18

## 2023-03-11 VITALS — BP 114/63 | HR 87 | Resp 18

## 2023-03-11 DIAGNOSIS — C541 Malignant neoplasm of endometrium: Secondary | ICD-10-CM | POA: Diagnosis not present

## 2023-03-11 DIAGNOSIS — T50905A Adverse effect of unspecified drugs, medicaments and biological substances, initial encounter: Secondary | ICD-10-CM

## 2023-03-11 DIAGNOSIS — Z5112 Encounter for antineoplastic immunotherapy: Secondary | ICD-10-CM | POA: Diagnosis not present

## 2023-03-11 DIAGNOSIS — D509 Iron deficiency anemia, unspecified: Secondary | ICD-10-CM | POA: Diagnosis not present

## 2023-03-11 LAB — T4: T4, Total: 10.2 ug/dL (ref 4.5–12.0)

## 2023-03-11 MED ORDER — SODIUM CHLORIDE 0.9 % IV SOLN
537.0000 mg | Freq: Once | INTRAVENOUS | Status: AC
  Administered 2023-03-11: 540 mg via INTRAVENOUS
  Filled 2023-03-11: qty 54

## 2023-03-11 MED ORDER — SODIUM CHLORIDE 0.9 % IV SOLN
500.0000 mg | Freq: Once | INTRAVENOUS | Status: AC
  Administered 2023-03-11: 500 mg via INTRAVENOUS
  Filled 2023-03-11: qty 10

## 2023-03-11 MED ORDER — CETIRIZINE HCL 10 MG/ML IV SOLN
10.0000 mg | Freq: Once | INTRAVENOUS | Status: AC
Start: 2023-03-11 — End: 2023-03-11
  Administered 2023-03-11: 10 mg via INTRAVENOUS
  Filled 2023-03-11: qty 1

## 2023-03-11 MED ORDER — FAMOTIDINE IN NACL 20-0.9 MG/50ML-% IV SOLN
20.0000 mg | Freq: Once | INTRAVENOUS | Status: AC
  Administered 2023-03-11: 20 mg via INTRAVENOUS
  Filled 2023-03-11: qty 50

## 2023-03-11 MED ORDER — DEXAMETHASONE SODIUM PHOSPHATE 10 MG/ML IJ SOLN
10.0000 mg | Freq: Once | INTRAMUSCULAR | Status: AC
  Administered 2023-03-11: 10 mg via INTRAVENOUS
  Filled 2023-03-11: qty 1

## 2023-03-11 MED ORDER — PACLITAXEL CHEMO INJECTION 300 MG/50ML
131.2500 mg/m2 | Freq: Once | INTRAVENOUS | Status: AC
  Administered 2023-03-11: 258 mg via INTRAVENOUS
  Filled 2023-03-11: qty 43

## 2023-03-11 MED ORDER — SODIUM CHLORIDE 0.9 % IV SOLN: INTRAVENOUS | Status: DC

## 2023-03-11 MED ORDER — PALONOSETRON HCL INJECTION 0.25 MG/5ML
0.2500 mg | Freq: Once | INTRAVENOUS | Status: AC
  Administered 2023-03-11: 0.25 mg via INTRAVENOUS
  Filled 2023-03-11: qty 5

## 2023-03-11 MED ORDER — SODIUM CHLORIDE 0.9 % IV SOLN
150.0000 mg | Freq: Once | INTRAVENOUS | Status: AC
  Administered 2023-03-11: 150 mg via INTRAVENOUS
  Filled 2023-03-11: qty 150

## 2023-03-11 NOTE — Progress Notes (Signed)
 [facility] OFFICE PROGRESS NOTE  Patient Care Team: Sebastian Jerilyn HERO, FNP as PCP - General (Family Medicine) Ladona Heinz, MD as PCP - Cardiology (Cardiology)  ASSESSMENT & PLAN:  Endometrial cancer [facility]) Her port site is healed We discussed side effects to be expected from treatment We will help get her the resources to complete her disability paperwork to excuse her from work while she is receiving treatment I will see her again in 3 weeks for further follow-up  Microcytic anemia She has microcytic anemia She is likely iron  deficient from surgery I recommend daily oral iron  supplement  No orders of the defined types were placed in this encounter.   All questions were answered. The patient knows to call the clinic with any problems, questions or concerns. The total time spent in the appointment was 20 minutes encounter with patients including review of chart and various tests results, discussions about plan of care and coordination of care plan   Almarie Bedford, MD 03/11/2023 8:26 AM  INTERVAL HISTORY: Please see below for problem oriented charting. she returns for chemo follow-up We discussed side effects to be expected The patient needs help to complete disability paperwork to excuse her from work while she is receiving treatment Otherwise, she have no other questions or concerns today We discussed recent test results  REVIEW OF SYSTEMS:   Constitutional: Denies fevers, chills or abnormal weight loss Eyes: Denies blurriness of vision Ears, nose, mouth, throat, and face: Denies mucositis or sore throat Respiratory: Denies cough, dyspnea or wheezes Cardiovascular: Denies palpitation, chest discomfort or lower extremity swelling Gastrointestinal:  Denies nausea, heartburn or change in bowel habits Skin: Denies abnormal skin rashes Lymphatics: Denies new lymphadenopathy or easy bruising Neurological:Denies numbness, tingling or new weaknesses Behavioral/Psych:  Mood is stable, no new changes  All other systems were reviewed with the patient and are negative.  I have reviewed the past medical history, past surgical history, social history and family history with the patient and they are unchanged from previous note.  ALLERGIES:  is allergic to celecoxib.  MEDICATIONS:  Current Outpatient Medications  Medication Sig Dispense Refill   dexamethasone  (DECADRON ) 4 MG tablet Take 2 tabs at the night before and 2 tab the morning of chemotherapy, every 3 weeks, by mouth x 6 cycles 24 tablet 6   ezetimibe  (ZETIA ) 10 MG tablet Take 1 tablet (10 mg total) by mouth daily. 90 tablet 0   gabapentin  (NEURONTIN ) 100 MG capsule Take 100 mg by mouth at bedtime as needed (nerve pain).     lidocaine -prilocaine  (EMLA ) cream Apply to affected area once 30 g 3   lisinopril  (ZESTRIL ) 10 MG tablet Take 10 mg by mouth daily.     metFORMIN  (GLUCOPHAGE ) 500 MG tablet Take 500 mg by mouth 2 (two) times daily with a meal.     ondansetron  (ZOFRAN ) 8 MG tablet Take 1 tablet (8 mg total) by mouth every 8 (eight) hours as needed for nausea or vomiting. Start on the third day after chemotherapy. 30 tablet 1   oxyCODONE  (ROXICODONE ) 5 MG immediate release tablet Take 1 tablet (5 mg total) by mouth every 4 (four) hours as needed for severe pain (pain score 7-10). 15 tablet 0   prochlorperazine  (COMPAZINE ) 10 MG tablet Take 1 tablet (10 mg total) by mouth every 6 (six) hours as needed for nausea or vomiting. 30 tablet 1   RYBELSUS  7 MG TABS Take 1 tablet by mouth daily.     simvastatin  (ZOCOR ) 40 MG  tablet Take 40 mg by mouth at bedtime.     No current facility-administered medications for this visit.    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 After discharge became purulent in appearance, she presented to ED and was admitted from 12/11-12/13/24. Mass was noted superiorly within the vagina, necrotic in appearance. Treatment for PID was recommended.    12/14/2022 Imaging   Pelvic ultrasound:  1. Enlarged and heterogeneous postmenopausal uterus with multiple fibroids. 2. Echogenic 2.4 cm ovoid lesion within the mid uterus. This may represent a fibroid versus endometrial pathology as the endometrium is not identified on today's exam. Multiphase MRI of the pelvis could be obtained for further evaluation, as clinically indicated. 3.  Nonvisualization of the bilateral ovaries.   01/11/2023 Imaging   Pelvic ultrasound: 1. 7.8 cm hypervascular mass within the lower uterine segment, extending toward the cervix, with evidence of a vascular stalk on Doppler imaging. Differential would include endometrial mass such as polyp versus pedunculated submucosal fibroid. Dedicated nonemergent pelvic MRI with and without contrast is recommended. 2. Nonvisualization of the ovaries and endometrium   01/11/2023 Initial Biopsy   Biopsy of cervical mass - Malignant smooth muscle tumor with necrosis, consistent with leiomyosarcoma, see comment  Differential diagnosis can include a carcinosarcoma.     01/13/2023 Imaging   MRI pelvis: 12.6 cm endocervical mass, extending to the uterine fundus, as above. Myometrial invasion involving the anterior lower uterine segment. No parametrial/serosal extension. Correlate with pending biopsy. Mildly prominent bilateral pelvic/inguinal nodes, indeterminate but suspicious for small nodal metastases. Small volume pelvic ascites.   01/20/2023 Initial Diagnosis   Endometrial  cancer (HCC)   02/03/2023 Imaging   CT C/A: 1. Multiple small bilateral pulmonary nodules measuring up to 0.4 cm, nonspecific. These may be infectious or inflammatory although warrant close attention on follow-up as early pulmonary metastatic disease is not excluded. 2. No evidence of lymphadenopathy or metastatic disease in the abdomen. 3. Hepatic steatosis.   02/03/2023 Surgery   Robotic-assisted laparoscopic total hysterectomy with bilateral salpingoophorectomy, bilateral pelvic lymph node dissection, mini laparotomy for specimen delivery, cystoscopy Large dilated cervix secondary to prolapsing mass requiring additional OR time and partial ureterolysis. The complexity of this surgery increased the duration of the procedure by 75 minutes.  Findings: On EUA, large necrotic and foul smelling tumor was filling the vagina prolapsing through the cervix, which was completely dilated. On intra-abdominal entry, normal upper abdominal survey. Normal omentum, small and large bowel. Uterus 10 cm, very bulbous lower uterine segment and cervix. Normal appearing bilateral adnexa. No ascites. Upon delivery of the uterine specimen, significant necrosis noted of the large (8-10cm) prolapsing mass.  No clearly identifiable cervix, so additional cervical/vaginal margin taken after hysterectomy completed.  Partial ureterolysis performed given dilated cervix.  On cystoscopy after hysterectomy, bladder dome noted to be intact and good efflux seen from bilateral ureteral orifices.  Upon inspection of bilateral nodal basins, prominent left obturator lymph node.  Somewhat calcified appearing left internal iliac lymph node removed with prominent left external iliac lymph nodes.    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 ANTERIOR VAGINAL MARGIN, EXCISION: Malignant mixed mullerian tumor (carcinosarcoma) (pT3b) Distal margin free by 3 mm  B. POSTERIOR VAGINAL MARGIN, EXCISION: Malignant mixed mullerian tumor (carcinosarcoma)(pT3b) Distal margin free by 3 mm  C. UTERUS WITH RIGHT AND LEFT FALLOPIAN TUBE AND OVARY, HYSTERECTOMY AND BILATERAL SALPINGO-OOPHORECTOMY: Malignant mixed mullerian tumor (carcinosarcoma) Tumor measures 23.5 cm in greatest dimension Tumor involves the entire endometrium with the tumor mass predominantly confined to lower uterine segment and cervix with cervical stromal invasion Tumor invades less than 50% of myometrium (2 mm of 17 mm, 12%) Extensive angiolymphatic invasion (uterine and focally within left fallopian tube)   Tumor focally involves serosa of right and left ovary Tumor present in cervical margin Benign leiomyoma, intramural, largest measuring 2.1 cm in greatest dimension Benign fallopian tubes  D. LEFT OBTURATOR LYMPH NODE, RESECTION: Two benign lymph nodes, negative for carcinoma (0/2)  E. LEFT EXTERNAL ILIAC LYMPH NODE, RESECTION: Two of 3 lymph nodes with metastatic carcinoma (2/3) Extensive perinodal intralymphatic tumor  F. RIGHT EXTERNAL ILIAC LYMPH NODE, RESECTION: One lymph node with a micrometastasis of carcinoma (1/1)  ONCOLOGY TABLE:  UTERUS, CARCINOMA OR CARCINOSARCOMA: Resection  Procedure: Total hysterectomy and bilateral salpingo-oophorectomy with additional vaginal margin and lymph node Histologic Type: Malignant mixed mullerian tumor (carcinosarcoma) Histologic Grade: High-grade (FIGO 3) Myometrial Invasion:       Depth of Myometrial Invasion (mm): 2 mm      Myometrial Thickness (mm): 17 mm      Percentage of Myometrial Invasion: 12% Uterine Serosa Involvement: Not identified Cervical stromal Involvement: Present Extent of involvement of other tissue/organs: Tumor invades vaginal mucosa Peritoneal/Ascitic Fluid: Positive for adenocarcinoma Lymphovascular Invasion: Present and extensive Regional Lymph Nodes:  Pelvic Lymph Nodes Examined:          0 Sentinel          6 non-sentinel          6 total      Pelvic Lymph Nodes with Metastasis: 3          Macrometastasis: (>2.0 mm): 2          Micrometastasis: (>0.2 mm and < 2.0 mm): 1 (right external iliac)          Isolated Tumor Cells (<0.2 mm): 0          Laterality of Lymph Node with Tumor: Left external iliac and right external iliac          Extracapsular Extension: Not identified      Para-aortic Lymph Nodes Examined:  0 Distant Metastasis: Not applicable Pathologic Stage Classification (pTNM, AJCC 8th Edition): pT3b, pN1a Ancillary Studies: MMR has been ordered; results will be issued within an addendum.     02/14/2023 Cancer Staging   Staging form: Corpus Uteri - Carcinoma and Carcinosarcoma, AJCC 8th Edition and FIGO 2023 - Pathologic stage from 02/14/2023: Stage IIIC1 (pT3, pN1, cM0, POLE: Unknown, MMRd-, p53-) - Signed by Lonn Hicks, MD on 02/14/2023 Stage prefix: Initial diagnosis   03/04/2023 Procedure   Successful  placement of a right internal jugular approach power injectable Port-A-Cath. The catheter is ready for immediate use.   03/12/2023 -  Chemotherapy   Patient is on Treatment Plan : UTERINE ENDOMETRIAL Dostarlimab -gxly (500 mg) + Carboplatin  (AUC 5) + Paclitaxel  (175 mg/m2) q21d x 6 cycles / Dostarlimab -gxly (1000 mg) q42d x 6 cycles        PHYSICAL EXAMINATION: ECOG PERFORMANCE STATUS: 1 - Symptomatic but completely ambulatory  Vitals:   03/11/23 0809  BP: (!) 147/74  Pulse: (!) 109  Resp: 18  Temp: 97.7 F (36.5  C)  SpO2: 100%   Filed Weights   03/11/23 0809  Weight: 177 lb 9.6 oz (80.6 kg)    GENERAL:alert, no distress and comfortable Port incision site is healed  LABORATORY DATA:  I have reviewed the data as listed    Component Value Date/Time   NA 139 03/09/2023 1114   K 3.1 (L) 03/09/2023 1114   CL 102 03/09/2023 1114   CO2 30 03/09/2023 1114   GLUCOSE 91 03/09/2023 1114   BUN 10 03/09/2023 1114   CREATININE 0.93 03/09/2023 1114   CALCIUM 9.3 03/09/2023 1114   PROT 8.0 03/09/2023 1114   ALBUMIN 4.0 03/09/2023 1114   AST 11 (L) 03/09/2023 1114   ALT <5 03/09/2023 1114   ALKPHOS 65 03/09/2023 1114   BILITOT 0.4 03/09/2023 1114   GFRNONAA >60 03/09/2023 1114    No results found for: SPEP, UPEP  Lab Results  Component Value Date   WBC 4.1 03/09/2023   NEUTROABS 2.6 03/09/2023   HGB 9.9 (L) 03/09/2023   HCT 32.6 (L) 03/09/2023   MCV 78.2 (L) 03/09/2023   PLT 313 03/09/2023      Chemistry      Component Value Date/Time   NA 139 03/09/2023 1114   K 3.1 (L) 03/09/2023 1114   CL 102 03/09/2023 1114   CO2 30 03/09/2023 1114   BUN 10 03/09/2023 1114   CREATININE 0.93 03/09/2023 1114      Component Value Date/Time   CALCIUM 9.3 03/09/2023 1114   ALKPHOS 65 03/09/2023 1114   AST 11 (L) 03/09/2023 1114   ALT <5 03/09/2023 1114   BILITOT 0.4 03/09/2023 1114       RADIOGRAPHIC STUDIES: I have personally reviewed the radiological images as listed and agreed with the findings in the report. IR IMAGING GUIDED PORT INSERTION Result Date: 03/04/2023 INDICATION: History of leiomyosarcoma and endometrial cancer. In need of durable intravenous access for chemotherapy administration. EXAM: IMPLANTED PORT A CATH PLACEMENT WITH ULTRASOUND AND FLUOROSCOPIC GUIDANCE COMPARISON:  Chest CT-01/27/2023 MEDICATIONS: None ANESTHESIA/SEDATION: Moderate (conscious) sedation was employed during this procedure as administered by the Interventional Radiology RN. A total of Versed  2 mg and  Fentanyl  100 mcg was administered intravenously. Moderate Sedation Time: 28 minutes. The patient's level of consciousness and vital signs were monitored continuously by radiology nursing throughout the procedure under my direct supervision. CONTRAST:  None FLUOROSCOPY TIME:  19 seconds (1 mGy) COMPLICATIONS: None immediate. PROCEDURE: The procedure, risks, benefits, and alternatives were explained to the patient. Questions regarding the procedure were encouraged and answered. The patient understands and consents to the procedure. The right neck and chest were prepped with chlorhexidine  in a sterile fashion, and a sterile drape was applied covering the operative field. Maximum barrier sterile technique with sterile gowns and gloves were used for the procedure. A timeout was performed prior to the initiation of the procedure. Local anesthesia was provided with 1% lidocaine  with epinephrine . After creating  a small venotomy incision, a micropuncture kit was utilized to access the internal jugular vein. Real-time ultrasound guidance was utilized for vascular access including the acquisition of a permanent ultrasound image documenting patency of the accessed vessel. The microwire was utilized to measure appropriate catheter length. A subcutaneous port pocket was then created along the upper chest wall utilizing a combination of sharp and blunt dissection. The pocket was irrigated with sterile saline. A single lumen Slim sized power injectable port was chosen for placement. The 8 Fr catheter was tunneled from the port pocket site to the venotomy incision. The port was placed in the pocket. The external catheter was trimmed to appropriate length. At the venotomy, an 8 Fr peel-away sheath was placed over a guidewire under fluoroscopic guidance. The catheter was then placed through the sheath and the sheath was removed. Final catheter positioning was confirmed and documented with a fluoroscopic spot radiograph. The port was  accessed with a Huber needle, aspirated and flushed with heparinized saline. The venotomy site was closed with an interrupted 4-0 Vicryl suture. The port pocket incision was closed with interrupted 2-0 Vicryl suture. Dermabond and Steri-strips were applied to both incisions. Dressings were applied. The patient tolerated the procedure well without immediate post procedural complication. FINDINGS: After catheter placement, the tip lies within the superior cavoatrial junction. The catheter aspirates and flushes normally and is ready for immediate use. IMPRESSION: Successful placement of a right internal jugular approach power injectable Port-A-Cath. The catheter is ready for immediate use. Electronically Signed   By: Norleen Roulette M.D.   On: 03/04/2023 16:51

## 2023-03-11 NOTE — Assessment & Plan Note (Signed)
 Her port site is healed We discussed side effects to be expected from treatment We will help get her the resources to complete her disability paperwork to excuse her from work while she is receiving treatment I will see her again in 3 weeks for further follow-up

## 2023-03-11 NOTE — Progress Notes (Signed)
    DATE:  03/11/23                                        X CHEMO/IMMUNOTHERAPY REACTION           MD: Lonn   AGENT/BLOOD PRODUCT RECEIVING TODAY:              Taxol    AGENT/BLOOD PRODUCT RECEIVING IMMEDIATELY PRIOR TO REACTION:          Taxol , Jemperli , carboplatin     Vitals:   03/11/23 1033 03/11/23 1037  BP: 105/70 114/62  Pulse: 82 80  Resp: 18 18  SpO2: 100% 100%      REACTION(S):           headache, abdominal cramp, leg pain, diaphoresis   PREMEDS:     Pepcid  20 mg IV, Decadron  10 mg IV, Cetirizine  10 mg IV, Emend 150 mg IV, Aloxi  0.25 mg IV   INTERVENTION: Pepcid  20 mg IV, IVF   Review of Systems  Review of Systems  Constitutional:  Positive for diaphoresis.  Musculoskeletal:  Positive for arthralgias and back pain.  Neurological:  Positive for headaches.  All other systems reviewed and are negative.    Physical Exam  Physical Exam Vitals and nursing note reviewed.  Constitutional:      Appearance: She is diaphoretic. She is not ill-appearing or toxic-appearing.  HENT:     Head: Normocephalic.  Eyes:     Conjunctiva/sclera: Conjunctivae normal.  Cardiovascular:     Rate and Rhythm: Normal rate and regular rhythm.     Pulses: Normal pulses.     Heart sounds: Normal heart sounds.  Pulmonary:     Effort: Pulmonary effort is normal.     Breath sounds: Normal breath sounds.  Abdominal:     General: There is no distension.  Musculoskeletal:     Cervical back: Normal range of motion.  Skin:    General: Skin is warm.  Neurological:     Mental Status: She is alert.     OUTCOME:                First time treatment. Patient became symptomatic after receiving approximately 2 ml of Taxol .  Emergency medications were administered as documented above. Patient returned to baseline. Oncologist notified and plans to switch out taxol  to taxotere  for future treatments . Patient tolerated remainder of treatment.

## 2023-03-11 NOTE — Progress Notes (Signed)
 Hypersensitivity Reaction note  Date of event: 03/11/23 Time of event: 1035 Generic name of drug involved: Taxol  Name of provider notified of the hypersensitivity reaction: Lonn Was agent that likely caused hypersensitivity reaction added to Allergies List within EMR? Yes  Chain of events including reaction signs/symptoms, treatment administered, and outcome (e.g., drug resumed; drug discontinued; sent to Emergency Department; etc.) Patient receiving treatment for the first time today. Was in her first initial titration and started complaining of head tightness, stomach cramping, and diaphoresis. Infusion stopped. Fluids started along with IV pepcid . Symptoms resolved. Patient was 2ml in to the drug. Per Dr. Lonn D/C medication.  Danielle Mullins An, RN 03/11/2023 11:06 AM

## 2023-03-11 NOTE — Assessment & Plan Note (Signed)
 She has microcytic anemia She is likely iron  deficient from surgery I recommend daily oral iron  supplement

## 2023-03-11 NOTE — Progress Notes (Signed)
 [facility] Clinical Social Work  Initial Assessment   Danielle Mullins is a 63 y.o. year old female presenting alone for 1st infusion. Clinical Social Work was referred by new patient protocol for assessment of psychosocial needs.   SDOH (Social Determinants of Health) assessments performed: Yes SDOH Interventions    Flowsheet Row Clinical Support from 03/11/2023 in [facility] - A Dept Of [place]  SDOH Interventions   Food Insecurity Interventions Intervention Not Indicated  Housing Interventions Intervention Not Indicated  Transportation Interventions Intervention Not Indicated  Utilities Interventions Intervention Not Indicated       SDOH Screenings   Food Insecurity: No Food Insecurity (02/28/2023)  Housing: Unknown (02/28/2023)  Transportation Needs: No Transportation Needs (02/28/2023)  Utilities: Not At Risk (02/28/2023)  Depression (PHQ2-9): Low Risk  (02/28/2023)  Tobacco Use: Low Risk  (03/11/2023)     Distress Screen completed: No     No data to display            Family/Social Information:  Housing Arrangement: patient lives alone. Has brother & his family nearby Family members/support persons in your life? Family- brother, sister in law, 2 adult nephews Transportation concerns: no  Employment: Out on work excuse from [employer]. Working on getting leave extended due to starting chemo.  Income source: Short-Term Disability Financial concerns: No Type of concern: None Food access concerns: no Religious or spiritual practice: Not known Advanced directives: No- discussed AD clinic Services Currently in place:  BCBS, FMLA  Coping/ Adjustment to diagnosis: Patient understands treatment plan and what happens next? yes, starting chemo today. She was nervous the last couple of days, but already feeling more relaxed as she learns what to expect Concerns about diagnosis and/or treatment: Pain or discomfort during procedures Patient reported  stressors: Adjusting to my illness Current coping skills/ strengths: Ability for insight , Capable of independent living , Communication skills , and Supportive family/friends     SUMMARY: Current SDOH Barriers:  No major barriers identified at this time  Clinical Social Work Clinical Goal(s):  No clinical social work goals at this time  Interventions: Discussed common feeling and emotions when being diagnosed with cancer, and the importance of support during treatment Informed patient of the support team roles and support services at [facility] Provided CSW contact information and encouraged patient to call with any questions or concerns   Follow Up Plan: Patient will contact CSW with any support or resource needs Patient verbalizes understanding of plan: Yes    Danielle Mullins E Danielle Cure, LCSW Clinical Social Worker [facility]

## 2023-03-11 NOTE — Patient Instructions (Signed)
 [facility] - A DEPT OF [facility]  Discharge Instructions: Thank you for choosing [facility] to provide your oncology and hematology care.   If you have a lab appointment with the Cancer Center, please go directly to the Cancer Center and check in at the registration area.   Wear comfortable clothing and clothing appropriate for easy access to any Portacath or PICC line.   We strive to give you quality time with your provider. You may need to reschedule your appointment if you arrive late (15 or more minutes).  Arriving late affects you and other patients whose appointments are after yours.  Also, if you miss three or more appointments without notifying the office, you may be dismissed from the clinic at the provider's discretion.      For prescription refill requests, have your pharmacy contact our office and allow 72 hours for refills to be completed.    Today you received the following chemotherapy and/or immunotherapy agents Dostarlimab , Carboplatin , Taxol (Did not receive due to allergy)    To help prevent nausea and vomiting after your treatment, we encourage you to take your nausea medication as directed.  BELOW ARE SYMPTOMS THAT SHOULD BE REPORTED IMMEDIATELY: *FEVER GREATER THAN 100.4 F (38 C) OR HIGHER *CHILLS OR SWEATING *NAUSEA AND VOMITING THAT IS NOT CONTROLLED WITH YOUR NAUSEA MEDICATION *UNUSUAL SHORTNESS OF BREATH *UNUSUAL BRUISING OR BLEEDING *URINARY PROBLEMS (pain or burning when urinating, or frequent urination) *BOWEL PROBLEMS (unusual diarrhea, constipation, pain near the anus) TENDERNESS IN MOUTH AND THROAT WITH OR WITHOUT PRESENCE OF ULCERS (sore throat, sores in mouth, or a toothache) UNUSUAL RASH, SWELLING OR PAIN  UNUSUAL VAGINAL DISCHARGE OR ITCHING   Items with * indicate a potential emergency and should be followed up as soon as possible or go to the Emergency Department if any problems should occur.  Please  show the CHEMOTHERAPY ALERT CARD or IMMUNOTHERAPY ALERT CARD at check-in to the Emergency Department and triage nurse.  Should you have questions after your visit or need to cancel or reschedule your appointment, please contact [facility] - A DEPT OF [facility]  Dept: [phone]  and follow the prompts.  Office hours are 8:00 a.m. to 4:30 p.m. Monday - Friday. Please note that voicemails left after 4:00 p.m. may not be returned until the following business day.  We are closed weekends and major holidays. You have access to a nurse at all times for urgent questions. Please call the main number to the clinic Dept: [phone] and follow the prompts.   For any non-urgent questions, you may also contact your provider using MyChart. We now offer e-Visits for anyone 56 and older to request care online for non-urgent symptoms. For details visit [email or web address].   Also download the MyChart app! Go to the app store, search MyChart, open the app, select [facility], and log in with your MyChart username and password.

## 2023-03-14 ENCOUNTER — Telehealth: Payer: Self-pay

## 2023-03-14 NOTE — Telephone Encounter (Signed)
 Called her back to follow up on how she is doing today. She is good and has no complaints.  She verbalized understanding and appreciated the call.

## 2023-03-14 NOTE — Telephone Encounter (Signed)
-----   Message from Nurse Reymundo Caulk E sent at 03/11/2023 12:04 PM EST ----- Regarding: first time treatment- distarlimab,taxol , carbo=gorsuch Patient received treatment for the first time today. She is folowed by Dr. Marton Sleeper. She had a reaction to the taxol  and was unable to finish it. She returned to baseline and was able to receive the carboplatin . Ambulatory to lobby after

## 2023-03-14 NOTE — Telephone Encounter (Signed)
-----   Message from Danielle Mullins sent at 03/11/2023  1:07 PM EST ----- Pls call her on Monday to check on whether she has residual problem from infusion reaction For her next treatment, I will change out taxol  and replace with taxotere . Let her know

## 2023-03-14 NOTE — Telephone Encounter (Signed)
Called and left below message and ask her to call the office back. 

## 2023-03-14 NOTE — Telephone Encounter (Signed)
 Danielle Mullins states that she is doing fine. She is eating, drinking, and urinating well. She knows to call the office at [phone] if  she has any questions or concerns.

## 2023-03-15 ENCOUNTER — Other Ambulatory Visit: Payer: Self-pay

## 2023-03-16 ENCOUNTER — Other Ambulatory Visit: Payer: Self-pay | Admitting: Hematology and Oncology

## 2023-03-16 DIAGNOSIS — C541 Malignant neoplasm of endometrium: Secondary | ICD-10-CM

## 2023-03-17 ENCOUNTER — Telehealth: Payer: Self-pay | Admitting: *Deleted

## 2023-03-17 NOTE — Telephone Encounter (Signed)
Called patient to inform of [facility], spoke with patient and she is aware of these appts.

## 2023-03-18 ENCOUNTER — Telehealth: Payer: Self-pay

## 2023-03-18 NOTE — Telephone Encounter (Signed)
Notified pt of completion of FMLA paperwork. Pt verbalized understanding.

## 2023-03-22 ENCOUNTER — Other Ambulatory Visit: Payer: Self-pay | Admitting: Hematology and Oncology

## 2023-03-24 ENCOUNTER — Inpatient Hospital Stay: Payer: BC Managed Care – PPO | Admitting: Licensed Clinical Social Worker

## 2023-03-24 ENCOUNTER — Telehealth: Payer: Self-pay | Admitting: Oncology

## 2023-03-24 ENCOUNTER — Encounter: Payer: Self-pay | Admitting: Hematology and Oncology

## 2023-03-24 NOTE — Progress Notes (Signed)
 [facility] CSW Progress Note  Clinical Child psychotherapist contacted patient by phone to discuss financial assistance programs per message from [employer].  Pt will be receiving short-term disability, but a very limited amount which is straining finances.  Pt is unsure of her OOP maximum for insurance. CSW discussed what OOP max means and how to ask her insurance what her amount is.    Discussed other potential resources through Banker for [employer]. Pt will call [facility] to do phone eligibility screen. CSW will provide Casting for [facility] application at appt 2/27.  Will also provide food bag. Pt does not currently qualify for [employer] grant d/t not meeting criteria for assistance.  Patient also asked about Medicare. CSW explained being eligible if on Social Security Disability for 2 years or turning 65.  Discussed Medicaid and provided information on walk-in clinic at [facility] [address] where pt can speak with Medicaid worker to determine her eligibility.   Follow-up: CSW will provide food bag and application for [employer] on 2/27    Tammey Deeg E Forrest Jaroszewski, LCSW Clinical Social Worker [facility]

## 2023-03-24 NOTE — Telephone Encounter (Signed)
 Danielle Mullins called and asked if there are any grants available for cancer patients or any government assistance.  She is concerned about paying for her medical bills from surgery and treatment and does not have any income coming in.  Advised her that I will notify Danielle Mullins, Child psychotherapist to see if she knows of any assistance programs or grants.  Also gave her the number for [facility] Billing to discuss assistance.

## 2023-03-24 NOTE — Addendum Note (Signed)
 Encounter addended by: Edward Qualia on: 03/24/2023 12:59 PM  Actions taken: Imaging Exam ended

## 2023-03-31 ENCOUNTER — Inpatient Hospital Stay (HOSPITAL_BASED_OUTPATIENT_CLINIC_OR_DEPARTMENT_OTHER): Payer: BC Managed Care – PPO | Admitting: Hematology and Oncology

## 2023-03-31 ENCOUNTER — Telehealth: Payer: Self-pay

## 2023-03-31 ENCOUNTER — Inpatient Hospital Stay: Payer: BC Managed Care – PPO

## 2023-03-31 ENCOUNTER — Inpatient Hospital Stay: Payer: BC Managed Care – PPO | Admitting: Licensed Clinical Social Worker

## 2023-03-31 VITALS — BP 131/81 | HR 69 | Temp 98.2°F | Resp 18 | Ht 66.0 in | Wt 179.0 lb

## 2023-03-31 DIAGNOSIS — C541 Malignant neoplasm of endometrium: Secondary | ICD-10-CM | POA: Diagnosis not present

## 2023-03-31 DIAGNOSIS — D61818 Other pancytopenia: Secondary | ICD-10-CM

## 2023-03-31 DIAGNOSIS — Z5112 Encounter for antineoplastic immunotherapy: Secondary | ICD-10-CM | POA: Diagnosis not present

## 2023-03-31 LAB — CMP (CANCER CENTER ONLY)
ALT: 5 U/L (ref 0–44)
AST: 12 U/L — ABNORMAL LOW (ref 15–41)
Albumin: 3.9 g/dL (ref 3.5–5.0)
Alkaline Phosphatase: 65 U/L (ref 38–126)
Anion gap: 8 (ref 5–15)
BUN: 18 mg/dL (ref 8–23)
CO2: 26 mmol/L (ref 22–32)
Calcium: 8.7 mg/dL — ABNORMAL LOW (ref 8.9–10.3)
Chloride: 105 mmol/L (ref 98–111)
Creatinine: 0.96 mg/dL (ref 0.44–1.00)
GFR, Estimated: >60 mL/min (ref >60)
Glucose, Bld: 124 mg/dL — ABNORMAL HIGH (ref 70–99)
Potassium: 4 mmol/L (ref 3.5–5.1)
Sodium: 139 mmol/L (ref 135–145)
Total Bilirubin: 0.3 mg/dL (ref 0.0–1.2)
Total Protein: 7.7 g/dL (ref 6.5–8.1)

## 2023-03-31 LAB — CBC WITH DIFFERENTIAL (CANCER CENTER ONLY)
Abs Immature Granulocytes: 0 10*3/uL (ref 0.00–0.07)
Basophils Absolute: 0 10*3/uL (ref 0.0–0.1)
Basophils Relative: 0 %
Eosinophils Absolute: 0 10*3/uL (ref 0.0–0.5)
Eosinophils Relative: 1 %
HCT: 28.1 % — ABNORMAL LOW (ref 36.0–46.0)
Hemoglobin: 8.8 g/dL — ABNORMAL LOW (ref 12.0–15.0)
Immature Granulocytes: 0 %
Lymphocytes Relative: 31 %
Lymphs Abs: 0.7 10*3/uL (ref 0.7–4.0)
MCH: 24 pg — ABNORMAL LOW (ref 26.0–34.0)
MCHC: 31.3 g/dL (ref 30.0–36.0)
MCV: 76.6 fL — ABNORMAL LOW (ref 80.0–100.0)
Monocytes Absolute: 0.3 10*3/uL (ref 0.1–1.0)
Monocytes Relative: 14 %
Neutro Abs: 1.2 10*3/uL — ABNORMAL LOW (ref 1.7–7.7)
Neutrophils Relative %: 54 %
Platelet Count: 174 10*3/uL (ref 150–400)
RBC: 3.67 MIL/uL — ABNORMAL LOW (ref 3.87–5.11)
RDW: 15.8 % — ABNORMAL HIGH (ref 11.5–15.5)
WBC Count: 2.3 10*3/uL — ABNORMAL LOW (ref 4.0–10.5)
nRBC: 0 % (ref 0.0–0.2)

## 2023-03-31 MED ORDER — HEPARIN SOD (PORK) LOCK FLUSH 100 UNIT/ML IV SOLN
500.0000 [IU] | Freq: Once | INTRAVENOUS | Status: AC
  Administered 2023-03-31: 500 [IU]

## 2023-03-31 MED ORDER — OXYCODONE HCL 5 MG PO TABS: 5.0000 mg | ORAL_TABLET | Freq: Four times a day (QID) | ORAL | 0 refills | Status: DC | PRN

## 2023-03-31 MED ORDER — SODIUM CHLORIDE 0.9% FLUSH
10.0000 mL | Freq: Once | INTRAVENOUS | Status: AC
  Administered 2023-03-31: 10 mL

## 2023-03-31 MED ORDER — DEXAMETHASONE 4 MG PO TABS: ORAL_TABLET | ORAL | Status: DC

## 2023-03-31 MED FILL — Fosaprepitant Dimeglumine For IV Infusion 150 MG (Base Eq): INTRAVENOUS | Qty: 5 | Status: AC

## 2023-03-31 NOTE — Telephone Encounter (Signed)
 Notified Patient of prior authorization approval for Oxycodone HCL 5mg  Tablets. Medication is approved through 03/30/2024. Pharmacy notified. No other needs or concerns noted at this time.

## 2023-03-31 NOTE — Progress Notes (Signed)
 [facility] CSW Progress Note  Visual merchandiser met with patient to Financial trader for [facility] application. Application faxed in with supporting documents. They will contact pt directly regarding assistance. Patient concerned with medical bills as she is on short-term disability. CSW encouraged pt to speak with billing and explore hardship settlement requirements and/or [employer].  Also gave information on local resources that may help, including walk-in clinic information for applying for Medicaid.  Provided bag of food from [employer] today.    Danielle Mccauley E Jerine Surles, LCSW Clinical Social Worker [facility] [facility]

## 2023-04-01 ENCOUNTER — Inpatient Hospital Stay: Payer: BC Managed Care – PPO

## 2023-04-01 ENCOUNTER — Other Ambulatory Visit: Payer: Self-pay

## 2023-04-01 ENCOUNTER — Encounter: Payer: Self-pay | Admitting: Hematology and Oncology

## 2023-04-01 VITALS — BP 125/67 | HR 64 | Temp 98.4°F | Resp 16 | Wt 179.1 lb

## 2023-04-01 DIAGNOSIS — Z5112 Encounter for antineoplastic immunotherapy: Secondary | ICD-10-CM | POA: Diagnosis not present

## 2023-04-01 DIAGNOSIS — D61818 Other pancytopenia: Secondary | ICD-10-CM | POA: Insufficient documentation

## 2023-04-01 DIAGNOSIS — C541 Malignant neoplasm of endometrium: Secondary | ICD-10-CM

## 2023-04-01 MED ORDER — HEPARIN SOD (PORK) LOCK FLUSH 100 UNIT/ML IV SOLN
500.0000 [IU] | Freq: Once | INTRAVENOUS | Status: AC | PRN
  Administered 2023-04-01: 500 [IU]

## 2023-04-01 MED ORDER — SODIUM CHLORIDE 0.9 % IV SOLN
150.0000 mg | Freq: Once | INTRAVENOUS | Status: AC
  Administered 2023-04-01: 150 mg via INTRAVENOUS
  Filled 2023-04-01: qty 150

## 2023-04-01 MED ORDER — SODIUM CHLORIDE 0.9 % IV SOLN: INTRAVENOUS | Status: DC

## 2023-04-01 MED ORDER — CARBOPLATIN CHEMO INJECTION 600 MG/60ML
395.2000 mg | Freq: Once | INTRAVENOUS | Status: AC
  Administered 2023-04-01: 400 mg via INTRAVENOUS
  Filled 2023-04-01: qty 40

## 2023-04-01 MED ORDER — SODIUM CHLORIDE 0.9% FLUSH
10.0000 mL | INTRAVENOUS | Status: DC | PRN
  Administered 2023-04-01: 10 mL

## 2023-04-01 MED ORDER — SODIUM CHLORIDE 0.9 % IV SOLN
48.0000 mg/m2 | Freq: Once | INTRAVENOUS | Status: AC
  Administered 2023-04-01: 93 mg via INTRAVENOUS
  Filled 2023-04-01: qty 9.3

## 2023-04-01 MED ORDER — PALONOSETRON HCL INJECTION 0.25 MG/5ML
0.2500 mg | Freq: Once | INTRAVENOUS | Status: AC
  Administered 2023-04-01: 0.25 mg via INTRAVENOUS
  Filled 2023-04-01: qty 5

## 2023-04-01 MED ORDER — SODIUM CHLORIDE 0.9 % IV SOLN
500.0000 mg | Freq: Once | INTRAVENOUS | Status: AC
  Administered 2023-04-01: 500 mg via INTRAVENOUS
  Filled 2023-04-01: qty 10

## 2023-04-01 MED ORDER — CETIRIZINE HCL 10 MG/ML IV SOLN
10.0000 mg | Freq: Once | INTRAVENOUS | Status: AC
  Administered 2023-04-01: 10 mg via INTRAVENOUS
  Filled 2023-04-01: qty 1

## 2023-04-01 MED ORDER — FAMOTIDINE IN NACL 20-0.9 MG/50ML-% IV SOLN
20.0000 mg | Freq: Once | INTRAVENOUS | Status: AC
  Administered 2023-04-01: 20 mg via INTRAVENOUS
  Filled 2023-04-01: qty 50

## 2023-04-01 MED ORDER — DEXAMETHASONE SODIUM PHOSPHATE 10 MG/ML IJ SOLN
10.0000 mg | Freq: Once | INTRAMUSCULAR | Status: AC
  Administered 2023-04-01: 10 mg via INTRAVENOUS
  Filled 2023-04-01: qty 1

## 2023-04-01 NOTE — Patient Instructions (Signed)
 [facility] - A DEPT OF [facility]  Discharge Instructions: Thank you for choosing [facility] to provide your oncology and hematology care.   If you have a lab appointment with the Cancer Center, please go directly to the Cancer Center and check in at the registration area.   Wear comfortable clothing and clothing appropriate for easy access to any Portacath or PICC line.   We strive to give you quality time with your provider. You may need to reschedule your appointment if you arrive late (15 or more minutes).  Arriving late affects you and other patients whose appointments are after yours.  Also, if you miss three or more appointments without notifying the office, you may be dismissed from the clinic at the provider's discretion.      For prescription refill requests, have your pharmacy contact our office and allow 72 hours for refills to be completed.    Today you received the following chemotherapy and/or immunotherapy agents: Jemperli/Docetaxel/Carboplatin      To help prevent nausea and vomiting after your treatment, we encourage you to take your nausea medication as directed.  BELOW ARE SYMPTOMS THAT SHOULD BE REPORTED IMMEDIATELY: *FEVER GREATER THAN 100.4 F (38 C) OR HIGHER *CHILLS OR SWEATING *NAUSEA AND VOMITING THAT IS NOT CONTROLLED WITH YOUR NAUSEA MEDICATION *UNUSUAL SHORTNESS OF BREATH *UNUSUAL BRUISING OR BLEEDING *URINARY PROBLEMS (pain or burning when urinating, or frequent urination) *BOWEL PROBLEMS (unusual diarrhea, constipation, pain near the anus) TENDERNESS IN MOUTH AND THROAT WITH OR WITHOUT PRESENCE OF ULCERS (sore throat, sores in mouth, or a toothache) UNUSUAL RASH, SWELLING OR PAIN  UNUSUAL VAGINAL DISCHARGE OR ITCHING   Items with * indicate a potential emergency and should be followed up as soon as possible or go to the Emergency Department if any problems should occur.  Please show the CHEMOTHERAPY ALERT  CARD or IMMUNOTHERAPY ALERT CARD at check-in to the Emergency Department and triage nurse.  Should you have questions after your visit or need to cancel or reschedule your appointment, please contact [facility] - A DEPT OF [facility]  Dept: [phone]  and follow the prompts.  Office hours are 8:00 a.m. to 4:30 p.m. Monday - Friday. Please note that voicemails left after 4:00 p.m. may not be returned until the following business day.  We are closed weekends and major holidays. You have access to a nurse at all times for urgent questions. Please call the main number to the clinic Dept: [phone] and follow the prompts.   For any non-urgent questions, you may also contact your provider using MyChart. We now offer e-Visits for anyone 28 and older to request care online for non-urgent symptoms. For details visit [email or web address].   Also download the MyChart app! Go to the app store, search "MyChart", open the app, select [facility], and log in with your MyChart username and password.

## 2023-04-01 NOTE — Progress Notes (Signed)
 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 P53 mixed/WT, MMRp CARIS: MSI stable. PIK3CA and p53 mutant, ER neg, Her2/neu 0%  Cycle 1 of chemotherapy was complicated by allergic reaction to paclitaxel For cycle 2 of treatment, the plan will be to switch out paclitaxel and substitute with docetaxel Review of her blood work shows severe pancytopenia and I recommend we proceed with treatment with dose reduction Pancytopenia, acquired (HCC) She has severe pancytopenia due to treatment I recommend dose reduction She is not symptomatic and does not need blood transfusion support  No orders of the defined types were placed in this encounter.    Artis Delay, MD  INTERVAL HISTORY: she returns for treatment follow-up Complications related to previous cycle of chemotherapy included pancytopenia, and allergic reaction to paclitaxel that resolved with conservative management  PHYSICAL EXAMINATION: ECOG PERFORMANCE STATUS: 1 - Symptomatic but completely ambulatory  Vitals:   03/31/23 0914  BP: 131/81  Pulse: 69  Resp: 18  Temp: 98.2 F (36.8 C)  SpO2: 100%   Filed Weights   03/31/23 0914  Weight: 179 lb (81.2 kg)    Relevant data reviewed during this visit included CBC and CMP

## 2023-04-01 NOTE — Progress Notes (Signed)
 Per Dr. Bertis Ruddy OK to proceed with tx today with ANC 1.2. Pt educated on infection prevention measures.

## 2023-04-01 NOTE — Assessment & Plan Note (Addendum)
 She has severe pancytopenia due to treatment I recommend dose reduction She is not symptomatic and does not need blood transfusion support

## 2023-04-01 NOTE — Assessment & Plan Note (Addendum)
 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 P53 mixed/WT, MMRp CARIS: MSI stable. PIK3CA and p53 mutant, ER neg, Her2/neu 0%  Cycle 1 of chemotherapy was complicated by allergic reaction to paclitaxel For cycle 2 of treatment, the plan will be to switch out paclitaxel and substitute with docetaxel Review of her blood work shows severe pancytopenia and I recommend we proceed with treatment with dose reduction

## 2023-04-04 ENCOUNTER — Telehealth: Payer: Self-pay

## 2023-04-04 NOTE — Telephone Encounter (Signed)
 Pt called this morning requesting a Medical release form of returning to the work.I called the pt back and stated that we will need a Return to work form from her employer. Pt verbalized understanding  and stated that she can get the forms.

## 2023-04-07 ENCOUNTER — Telehealth: Payer: Self-pay | Admitting: *Deleted

## 2023-04-07 NOTE — Telephone Encounter (Signed)
 CALLED PATIENT TO REMIND OF NEW [facility]DR [facility] FOR 04-11-23 - ARRIVAL TIME- 7:45 AM @ [facility], SPOKE WITH PATIENT AND SHE IS AWARE OF THESE APPTS.

## 2023-04-09 NOTE — Progress Notes (Signed)
 Radiation Oncology         [phone] ________________________________  Name: Danielle Mullins MRN: [id number]  Date: 04/11/2023  DOB: February 17, 1960  Vaginal Brachytherapy Procedure Note  CC: Danielle Lager, FNP Carver Fila, MD  No diagnosis found.  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; positive for LVI; 12% myometrial invasion; extensive angiolymphatic invasion present (uterine and focally within left fallopian tube); p53 mixed/wild-type; MMR intact; MSI stable: s/p total hysterectomy, BSO, and bilateral pelvic lymph node excisions    Cancer Staging  Endometrial cancer (HCC) Staging form: Corpus Uteri - Carcinoma and Carcinosarcoma, AJCC 8th Edition and FIGO 2023 - Pathologic stage from 02/14/2023: Stage IIIC1 (pT3, pN1, cM0, POLE: Unknown, MMRd-, p53-) - Signed by Artis Delay, MD on 02/14/2023  Radiation Treatment Dates: ***  Narrative: She returns today for vaginal cylinder fitting. ***  Since her initial consultation of 02/28/23, she agreed to proceed with adjuvant chemotherapy consisting of Dostarlimab, Carboplatin, and Taxol 03/11/23. She however experienced a reaction to taxol during her first titration and treatment was promptly halted. Taxol was subsequently discontinued from her chemo regimen and replaced with Docetaxel. She received Docetaxel, Dostarlimab, and Carboplatin with her 2nd cycle on 04/01/23 which given at a reduced dose due to severe pancytopenia s/p cycle 1.   No other significant interval history since her initial consultation date.   ***    ALLERGIES: is allergic to paclitaxel and celecoxib.  Meds: Current Outpatient Medications  Medication Sig Dispense Refill   dexamethasone (DECADRON) 4 MG tablet Take 2 tabs in the morning the day before, then 1 tab daily for 2 days after chemo, every 3 weeks     ezetimibe (ZETIA) 10 MG tablet Take 1 tablet (10 mg total)  by mouth daily. 90 tablet 0   gabapentin (NEURONTIN) 100 MG capsule Take 100 mg by mouth at bedtime as needed (nerve pain).     lidocaine-prilocaine (EMLA) cream Apply to affected area once 30 g 3   lisinopril (ZESTRIL) 10 MG tablet Take 10 mg by mouth daily.     metFORMIN (GLUCOPHAGE) 500 MG tablet Take 500 mg by mouth 2 (two) times daily with a meal.     ondansetron (ZOFRAN) 8 MG tablet Take 1 tablet (8 mg total) by mouth every 8 (eight) hours as needed for nausea or vomiting. Start on the third day after chemotherapy. 30 tablet 1   oxyCODONE (ROXICODONE) 5 MG immediate release tablet Take 1 tablet (5 mg total) by mouth every 6 (six) hours as needed for severe pain (pain score 7-10). 60 tablet 0   prochlorperazine (COMPAZINE) 10 MG tablet Take 1 tablet (10 mg total) by mouth every 6 (six) hours as needed for nausea or vomiting. 30 tablet 1   RYBELSUS 7 MG TABS Take 1 tablet by mouth daily.     simvastatin (ZOCOR) 40 MG tablet Take 40 mg by mouth at bedtime.     No current facility-administered medications for this encounter.    Physical Findings: The patient is in no acute distress. Patient is alert and oriented.  vitals were not taken for this visit.   No palpable cervical, supraclavicular or axillary lymphoadenopathy. The heart has a regular rate and rhythm. The lungs are clear to auscultation. Abdomen soft and non-tender.  On pelvic examination the external genitalia were unremarkable. A speculum exam was performed. Vaginal cuff intact, no mucosal lesions. On bimanual exam there were no pelvic masses appreciated. ***  Lab Findings: Lab Results  Component Value Date   WBC 2.3 (L) 03/31/2023   HGB 8.8 (L) 03/31/2023   HCT 28.1 (L) 03/31/2023   MCV 76.6 (L) 03/31/2023   PLT 174 03/31/2023    Radiographic Findings: No results found.  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 The patient will be treated with a *** cm diameter cylinder with a treatment length of *** cm. This distended the vaginal vault without undue discomfort. The patient tolerated the procedure well.  The patient was successfully fitted for a vaginal cylinder. The patient is appropriate to begin vaginal brachytherapy.   Plan: The patient will proceed with CT simulation and vaginal brachytherapy today.    _______________________________   Billie Lade, PhD, MD  This document serves as a record of services personally performed by Antony Blackbird, MD. It was created on his behalf by Neena Rhymes, a trained medical scribe. The creation of this record is based on the scribe's personal observations and the provider's statements to them. This document has been checked and approved by the attending provider.

## 2023-04-11 ENCOUNTER — Ambulatory Visit
Admission: RE | Admit: 2023-04-11 | Discharge: 2023-04-11 | Disposition: A | Discharge status: Home / Self Care | Payer: Self-pay | Source: Ambulatory Visit | Attending: Radiation Oncology | Admitting: Radiation Oncology

## 2023-04-11 ENCOUNTER — Telehealth: Payer: Self-pay | Admitting: *Deleted

## 2023-04-11 ENCOUNTER — Encounter: Payer: Self-pay | Admitting: Radiation Oncology

## 2023-04-11 ENCOUNTER — Ambulatory Visit
Admission: RE | Admit: 2023-04-11 | Discharge: 2023-04-11 | Disposition: A | Discharge status: Home / Self Care | Source: Ambulatory Visit | Attending: Radiation Oncology | Admitting: Radiation Oncology

## 2023-04-11 ENCOUNTER — Ambulatory Visit
Admission: RE | Admit: 2023-04-11 | Payer: BC Managed Care – PPO | Source: Ambulatory Visit | Admitting: Radiation Oncology

## 2023-04-11 ENCOUNTER — Ambulatory Visit
Admission: RE | Admit: 2023-04-11 | Discharge: 2023-04-11 | Disposition: A | Discharge status: Home / Self Care | Payer: BC Managed Care – PPO | Source: Ambulatory Visit | Attending: Radiation Oncology | Admitting: Radiation Oncology

## 2023-04-11 VITALS — BP 100/69 | HR 90 | Temp 97.4°F | Resp 18 | Ht 66.0 in | Wt 177.4 lb

## 2023-04-11 DIAGNOSIS — C541 Malignant neoplasm of endometrium: Secondary | ICD-10-CM | POA: Insufficient documentation

## 2023-04-11 DIAGNOSIS — C775 Secondary and unspecified malignant neoplasm of intrapelvic lymph nodes: Secondary | ICD-10-CM | POA: Diagnosis not present

## 2023-04-11 DIAGNOSIS — Z79899 Other long term (current) drug therapy: Secondary | ICD-10-CM | POA: Insufficient documentation

## 2023-04-11 DIAGNOSIS — Z7984 Long term (current) use of oral hypoglycemic drugs: Secondary | ICD-10-CM | POA: Diagnosis not present

## 2023-04-11 DIAGNOSIS — D61818 Other pancytopenia: Secondary | ICD-10-CM | POA: Diagnosis not present

## 2023-04-11 DIAGNOSIS — R5383 Other fatigue: Secondary | ICD-10-CM | POA: Insufficient documentation

## 2023-04-11 LAB — CBC WITH DIFFERENTIAL (CANCER CENTER ONLY)
Abs Immature Granulocytes: 0 10*3/uL (ref 0.00–0.07)
Basophils Absolute: 0 10*3/uL (ref 0.0–0.1)
Basophils Relative: 1 %
Eosinophils Absolute: 0 10*3/uL (ref 0.0–0.5)
Eosinophils Relative: 1 %
HCT: 26.3 % — ABNORMAL LOW (ref 36.0–46.0)
Hemoglobin: 8.4 g/dL — ABNORMAL LOW (ref 12.0–15.0)
Immature Granulocytes: 0 %
Lymphocytes Relative: 77 %
Lymphs Abs: 0.6 10*3/uL — ABNORMAL LOW (ref 0.7–4.0)
MCH: 23.8 pg — ABNORMAL LOW (ref 26.0–34.0)
MCHC: 31.9 g/dL (ref 30.0–36.0)
MCV: 74.5 fL — ABNORMAL LOW (ref 80.0–100.0)
Monocytes Absolute: 0.1 10*3/uL (ref 0.1–1.0)
Monocytes Relative: 16 %
Neutro Abs: 0 10*3/uL — CL (ref 1.7–7.7)
Neutrophils Relative %: 5 %
Platelet Count: 134 10*3/uL — ABNORMAL LOW (ref 150–400)
RBC: 3.53 MIL/uL — ABNORMAL LOW (ref 3.87–5.11)
RDW: 15.7 % — ABNORMAL HIGH (ref 11.5–15.5)
WBC Count: 0.8 10*3/uL — CL (ref 4.0–10.5)
nRBC: 0 % (ref 0.0–0.2)

## 2023-04-11 LAB — CMP (CANCER CENTER ONLY)
ALT: 7 U/L (ref 0–44)
AST: 13 U/L — ABNORMAL LOW (ref 15–41)
Albumin: 3.9 g/dL (ref 3.5–5.0)
Alkaline Phosphatase: 69 U/L (ref 38–126)
Anion gap: 7 (ref 5–15)
BUN: 17 mg/dL (ref 8–23)
CO2: 28 mmol/L (ref 22–32)
Calcium: 8.3 mg/dL — ABNORMAL LOW (ref 8.9–10.3)
Chloride: 102 mmol/L (ref 98–111)
Creatinine: 1 mg/dL (ref 0.44–1.00)
GFR, Estimated: >60 mL/min (ref >60)
Glucose, Bld: 105 mg/dL — ABNORMAL HIGH (ref 70–99)
Potassium: 4.1 mmol/L (ref 3.5–5.1)
Sodium: 137 mmol/L (ref 135–145)
Total Bilirubin: 0.3 mg/dL (ref 0.0–1.2)
Total Protein: 7.4 g/dL (ref 6.5–8.1)

## 2023-04-11 LAB — MAGNESIUM: Magnesium: 0.9 mg/dL — CL (ref 1.7–2.4)

## 2023-04-11 NOTE — Telephone Encounter (Signed)
 She had recent chemo, all abnormal labs are to be exepected

## 2023-04-11 NOTE — Telephone Encounter (Addendum)
 Dr. Roselind Messier  requested Dr. Bertis Ruddy be informed of lab results. Dr. Bertis Ruddy out of office today. Message sent to Dr. Bertis Ruddy with results.

## 2023-04-11 NOTE — Telephone Encounter (Signed)
 CRITICAL VALUE STICKER  CRITICAL VALUE: WBC 0.8; ANC 0.1; HGB 8.6; PLT 134  RECEIVER (on-site recipient of call):Sandi, RN  DATE & TIME NOTIFIED: 04/11/23; 1015  MESSENGER (representative from lab): Mindi Junker  MD NOTIFIED: Mindi Junker stated labs today ordered by Bryan Lemma, PA in [facility]. Patient being seen in [facility] today by Ms. Muir and Dr. Roselind Messier. Labs will also be reported to Ms. Corky Sing.   TIME OF NOTIFICATION:1015  RESPONSE: See above note

## 2023-04-11 NOTE — Progress Notes (Signed)
 Danielle Mullins is here today cylinder fitting and CT simulation.    Does the patient complain of any of the following:  Pain: Denies Abdominal bloating: Denies Diarrhea/Constipation: Denies Nausea/Vomiting: Denies Vaginal Discharge: Denies Blood in Urine or Stool: Denies   BP 100/69 (BP Location: Left Arm, Patient Position: Sitting, Cuff Size: Normal)   Pulse 90   Temp (!) 97.4 F (36.3 C)   Resp 18   Ht 5\' 6"  (1.676 m)   Wt 177 lb 6.4 oz (80.5 kg)   SpO2 100%   BMI 28.63 kg/m

## 2023-04-12 ENCOUNTER — Telehealth: Payer: Self-pay

## 2023-04-12 ENCOUNTER — Other Ambulatory Visit: Payer: Self-pay

## 2023-04-12 NOTE — Telephone Encounter (Signed)
 Returned her call and scheduled phone visit w/ Dr. Bertis Ruddy on 3/17. She is aware of appt.

## 2023-04-13 ENCOUNTER — Telehealth: Payer: Self-pay | Admitting: Oncology

## 2023-04-13 ENCOUNTER — Telehealth: Payer: Self-pay | Admitting: *Deleted

## 2023-04-13 NOTE — Telephone Encounter (Signed)
 Pt called, wanting to cancel phone visit with Dr.Gorusch. Per pt, "I don't think it's necessary." Appt was cancelled

## 2023-04-13 NOTE — Telephone Encounter (Signed)
 Called Danielle Mullins and scheduled an appointment with Warner Mccreedy, NP for tomorrow at 8:30.  She verbalized understanding and agreement.

## 2023-04-14 ENCOUNTER — Telehealth: Payer: Self-pay

## 2023-04-14 ENCOUNTER — Inpatient Hospital Stay: Attending: Gynecologic Oncology | Admitting: Gynecologic Oncology

## 2023-04-14 VITALS — BP 117/68 | HR 66 | Temp 98.6°F | Resp 18 | Ht 66.0 in | Wt 180.0 lb

## 2023-04-14 DIAGNOSIS — Z90710 Acquired absence of both cervix and uterus: Secondary | ICD-10-CM | POA: Diagnosis not present

## 2023-04-14 DIAGNOSIS — Z90722 Acquired absence of ovaries, bilateral: Secondary | ICD-10-CM | POA: Diagnosis not present

## 2023-04-14 DIAGNOSIS — C775 Secondary and unspecified malignant neoplasm of intrapelvic lymph nodes: Secondary | ICD-10-CM | POA: Diagnosis not present

## 2023-04-14 DIAGNOSIS — C541 Malignant neoplasm of endometrium: Secondary | ICD-10-CM

## 2023-04-14 DIAGNOSIS — Z79620 Long term (current) use of immunosuppressive biologic: Secondary | ICD-10-CM | POA: Diagnosis not present

## 2023-04-14 DIAGNOSIS — Z9221 Personal history of antineoplastic chemotherapy: Secondary | ICD-10-CM | POA: Diagnosis not present

## 2023-04-14 DIAGNOSIS — N898 Other specified noninflammatory disorders of vagina: Secondary | ICD-10-CM | POA: Diagnosis not present

## 2023-04-14 DIAGNOSIS — Z79633 Long term (current) use of mitotic inhibitor: Secondary | ICD-10-CM | POA: Insufficient documentation

## 2023-04-14 DIAGNOSIS — Z9079 Acquired absence of other genital organ(s): Secondary | ICD-10-CM | POA: Diagnosis not present

## 2023-04-14 NOTE — Telephone Encounter (Signed)
 Call placed to make patient aware that HDR treatments have been canceled until she completes chemo and is examined by Dr. Pricilla Holm. Patient voiced understanding.

## 2023-04-14 NOTE — Progress Notes (Unsigned)
 Patient presents to the office today for evaluation of the vaginal lesions seen at her recent visit with Dr. Sheldon Silvan and radiation oncology.  The patient states that chemotherapy has been tough and she was in the bed for 2 weeks with joint aches and feeling poorly.  She reports having a decreased appetite more within the past week.  No pain reported.  No vaginal bleeding or discharge reported.  States her bowels and bladder are functioning without difficulty.  2 cm fluctuant red lesion in the vagina in the left upper sidewall adjacent to the cuff

## 2023-04-14 NOTE — Patient Instructions (Addendum)
 On exam today, there is a darkened circular area that the top of the vagina that could be a hematoma (collection of blood) or a tumor deposit.   Today Dr. Pricilla Holm discussed two options.   1) We can plan for a procedure now in the operating room with light anesthesia to assess the area more closely and biopsy.   2) We can have you complete chemotherapy, place radiation on hold until after chemotherapy. Dr. Pricilla Holm will see you in the office after completion of chemotherapy to reassess the area. If the area has improved, then she will recommend radiation at that time.   Based on the discussion, you have decided to finish chemotherapy then plan for re-examination at that time. If you have someone to drive you to this appointment, we can prescribe you a medication to help with relaxation for the exam but you would need a driver to and from your appointment. You can let us know if you are interested in this.   We will contact you with the updated appointments. The appointments with Dr. Roselind Messier will be placed on hold. We will also call you with an appointment with Dr. Pricilla Holm.

## 2023-04-15 ENCOUNTER — Encounter: Payer: Self-pay | Admitting: Hematology and Oncology

## 2023-04-15 DIAGNOSIS — N898 Other specified noninflammatory disorders of vagina: Secondary | ICD-10-CM | POA: Insufficient documentation

## 2023-04-17 ENCOUNTER — Other Ambulatory Visit: Payer: Self-pay

## 2023-04-18 ENCOUNTER — Telehealth: Admitting: Hematology and Oncology

## 2023-04-18 ENCOUNTER — Ambulatory Visit: Payer: BC Managed Care – PPO | Admitting: Radiation Oncology

## 2023-04-21 ENCOUNTER — Telehealth: Payer: Self-pay | Admitting: Oncology

## 2023-04-21 MED FILL — Fosaprepitant Dimeglumine For IV Infusion 150 MG (Base Eq): INTRAVENOUS | Qty: 5 | Status: AC

## 2023-04-21 NOTE — Telephone Encounter (Signed)
 [name] called back and confirmed apt for 06/30/23.

## 2023-04-21 NOTE — Telephone Encounter (Signed)
 Left a message for [facility] with appointment to see Dr. Pricilla Holm on 06/30/23 at 2:15.  Requested a return call to confirm.

## 2023-04-22 ENCOUNTER — Other Ambulatory Visit: Payer: Self-pay

## 2023-04-22 ENCOUNTER — Encounter: Payer: Self-pay | Admitting: Hematology and Oncology

## 2023-04-22 ENCOUNTER — Inpatient Hospital Stay: Payer: BC Managed Care – PPO | Admitting: Hematology and Oncology

## 2023-04-22 ENCOUNTER — Inpatient Hospital Stay: Payer: BC Managed Care – PPO

## 2023-04-22 VITALS — BP 126/63 | HR 66 | Temp 97.4°F | Resp 18 | Ht 66.0 in | Wt 186.6 lb

## 2023-04-22 DIAGNOSIS — C541 Malignant neoplasm of endometrium: Secondary | ICD-10-CM

## 2023-04-22 DIAGNOSIS — D61818 Other pancytopenia: Secondary | ICD-10-CM | POA: Diagnosis not present

## 2023-04-22 DIAGNOSIS — N898 Other specified noninflammatory disorders of vagina: Secondary | ICD-10-CM | POA: Diagnosis not present

## 2023-04-22 LAB — CBC WITH DIFFERENTIAL (CANCER CENTER ONLY)
Abs Immature Granulocytes: 0.01 10*3/uL (ref 0.00–0.07)
Basophils Absolute: 0 10*3/uL (ref 0.0–0.1)
Basophils Relative: 0 %
Eosinophils Absolute: 0 10*3/uL (ref 0.0–0.5)
Eosinophils Relative: 2 %
HCT: 25.1 % — ABNORMAL LOW (ref 36.0–46.0)
Hemoglobin: 8 g/dL — ABNORMAL LOW (ref 12.0–15.0)
Immature Granulocytes: 0 %
Lymphocytes Relative: 25 %
Lymphs Abs: 0.6 10*3/uL — ABNORMAL LOW (ref 0.7–4.0)
MCH: 23.8 pg — ABNORMAL LOW (ref 26.0–34.0)
MCHC: 31.9 g/dL (ref 30.0–36.0)
MCV: 74.7 fL — ABNORMAL LOW (ref 80.0–100.0)
Monocytes Absolute: 0.3 10*3/uL (ref 0.1–1.0)
Monocytes Relative: 11 %
Neutro Abs: 1.5 10*3/uL — ABNORMAL LOW (ref 1.7–7.7)
Neutrophils Relative %: 62 %
Platelet Count: 187 10*3/uL (ref 150–400)
RBC: 3.36 MIL/uL — ABNORMAL LOW (ref 3.87–5.11)
RDW: 16.7 % — ABNORMAL HIGH (ref 11.5–15.5)
WBC Count: 2.5 10*3/uL — ABNORMAL LOW (ref 4.0–10.5)
nRBC: 0 % (ref 0.0–0.2)

## 2023-04-22 LAB — CMP (CANCER CENTER ONLY)
ALT: 5 U/L (ref 0–44)
AST: 11 U/L — ABNORMAL LOW (ref 15–41)
Albumin: 3.9 g/dL (ref 3.5–5.0)
Alkaline Phosphatase: 67 U/L (ref 38–126)
Anion gap: 9 (ref 5–15)
BUN: 15 mg/dL (ref 8–23)
CO2: 25 mmol/L (ref 22–32)
Calcium: 8.8 mg/dL — ABNORMAL LOW (ref 8.9–10.3)
Chloride: 106 mmol/L (ref 98–111)
Creatinine: 0.98 mg/dL (ref 0.44–1.00)
GFR, Estimated: >60 mL/min (ref >60)
Glucose, Bld: 108 mg/dL — ABNORMAL HIGH (ref 70–99)
Potassium: 4 mmol/L (ref 3.5–5.1)
Sodium: 140 mmol/L (ref 135–145)
Total Bilirubin: 0.2 mg/dL (ref 0.0–1.2)
Total Protein: 7.5 g/dL (ref 6.5–8.1)

## 2023-04-22 LAB — TSH: TSH: 1.994 u[IU]/mL (ref 0.350–4.500)

## 2023-04-22 MED ORDER — FAMOTIDINE IN NACL 20-0.9 MG/50ML-% IV SOLN
20.0000 mg | Freq: Once | INTRAVENOUS | Status: AC
  Administered 2023-04-22: 20 mg via INTRAVENOUS
  Filled 2023-04-22: qty 50

## 2023-04-22 MED ORDER — HEPARIN SOD (PORK) LOCK FLUSH 100 UNIT/ML IV SOLN
500.0000 [IU] | Freq: Once | INTRAVENOUS | Status: AC | PRN
Start: 2023-04-22 — End: 2023-04-22
  Administered 2023-04-22: 500 [IU]

## 2023-04-22 MED ORDER — SODIUM CHLORIDE 0.9 % IV SOLN
500.0000 mg | Freq: Once | INTRAVENOUS | Status: AC
  Administered 2023-04-22: 500 mg via INTRAVENOUS
  Filled 2023-04-22: qty 10

## 2023-04-22 MED ORDER — SODIUM CHLORIDE 0.9 % IV SOLN
345.8000 mg | Freq: Once | INTRAVENOUS | Status: AC
  Administered 2023-04-22: 350 mg via INTRAVENOUS
  Filled 2023-04-22: qty 35

## 2023-04-22 MED ORDER — DEXAMETHASONE SODIUM PHOSPHATE 10 MG/ML IJ SOLN
10.0000 mg | Freq: Once | INTRAMUSCULAR | Status: AC
  Administered 2023-04-22: 10 mg via INTRAVENOUS
  Filled 2023-04-22: qty 1

## 2023-04-22 MED ORDER — SODIUM CHLORIDE 0.9 % IV SOLN
150.0000 mg | Freq: Once | INTRAVENOUS | Status: AC
  Administered 2023-04-22: 150 mg via INTRAVENOUS
  Filled 2023-04-22: qty 150

## 2023-04-22 MED ORDER — SODIUM CHLORIDE 0.9 % IV SOLN
48.0000 mg/m2 | Freq: Once | INTRAVENOUS | Status: AC
  Administered 2023-04-22: 93 mg via INTRAVENOUS
  Filled 2023-04-22: qty 9.3

## 2023-04-22 MED ORDER — CETIRIZINE HCL 10 MG/ML IV SOLN
10.0000 mg | Freq: Once | INTRAVENOUS | Status: AC
  Administered 2023-04-22: 10 mg via INTRAVENOUS
  Filled 2023-04-22: qty 1

## 2023-04-22 MED ORDER — SODIUM CHLORIDE 0.9 % IV SOLN: INTRAVENOUS | Status: DC

## 2023-04-22 MED ORDER — PALONOSETRON HCL INJECTION 0.25 MG/5ML
0.2500 mg | Freq: Once | INTRAVENOUS | Status: AC
  Administered 2023-04-22: 0.25 mg via INTRAVENOUS
  Filled 2023-04-22: qty 5

## 2023-04-22 MED ORDER — SODIUM CHLORIDE 0.9% FLUSH
10.0000 mL | Freq: Once | INTRAVENOUS | Status: AC
  Administered 2023-04-22: 10 mL

## 2023-04-22 MED ORDER — SODIUM CHLORIDE 0.9% FLUSH
10.0000 mL | INTRAVENOUS | Status: DC | PRN
Start: 2023-04-22 — End: 2023-04-22
  Administered 2023-04-22: 10 mL

## 2023-04-22 NOTE — Patient Instructions (Signed)
 [facility] - A DEPT OF [facility]  Discharge Instructions: Thank you for choosing [facility] to provide your oncology and hematology care.   If you have a lab appointment with the Cancer Center, please go directly to the Cancer Center and check in at the registration area.   Wear comfortable clothing and clothing appropriate for easy access to any Portacath or PICC line.   We strive to give you quality time with your provider. You may need to reschedule your appointment if you arrive late (15 or more minutes).  Arriving late affects you and other patients whose appointments are after yours.  Also, if you miss three or more appointments without notifying the office, you may be dismissed from the clinic at the provider's discretion.      For prescription refill requests, have your pharmacy contact our office and allow 72 hours for refills to be completed.    Today you received the following chemotherapy and/or immunotherapy agents: Jemperli/Docetaxel/Carboplatin      To help prevent nausea and vomiting after your treatment, we encourage you to take your nausea medication as directed.  BELOW ARE SYMPTOMS THAT SHOULD BE REPORTED IMMEDIATELY: *FEVER GREATER THAN 100.4 F (38 C) OR HIGHER *CHILLS OR SWEATING *NAUSEA AND VOMITING THAT IS NOT CONTROLLED WITH YOUR NAUSEA MEDICATION *UNUSUAL SHORTNESS OF BREATH *UNUSUAL BRUISING OR BLEEDING *URINARY PROBLEMS (pain or burning when urinating, or frequent urination) *BOWEL PROBLEMS (unusual diarrhea, constipation, pain near the anus) TENDERNESS IN MOUTH AND THROAT WITH OR WITHOUT PRESENCE OF ULCERS (sore throat, sores in mouth, or a toothache) UNUSUAL RASH, SWELLING OR PAIN  UNUSUAL VAGINAL DISCHARGE OR ITCHING   Items with * indicate a potential emergency and should be followed up as soon as possible or go to the Emergency Department if any problems should occur.  Please show the CHEMOTHERAPY ALERT  CARD or IMMUNOTHERAPY ALERT CARD at check-in to the Emergency Department and triage nurse.  Should you have questions after your visit or need to cancel or reschedule your appointment, please contact [facility] - A DEPT OF [facility]  Dept: [phone]  and follow the prompts.  Office hours are 8:00 a.m. to 4:30 p.m. Monday - Friday. Please note that voicemails left after 4:00 p.m. may not be returned until the following business day.  We are closed weekends and major holidays. You have access to a nurse at all times for urgent questions. Please call the main number to the clinic Dept: [phone] and follow the prompts.   For any non-urgent questions, you may also contact your provider using MyChart. We now offer e-Visits for anyone 28 and older to request care online for non-urgent symptoms. For details visit [email or web address].   Also download the MyChart app! Go to the app store, search "MyChart", open the app, select [facility], and log in with your MyChart username and password.

## 2023-04-22 NOTE — Progress Notes (Signed)
 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 P53 mixed/WT, MMRp CARIS: MSI stable. PIK3CA and p53 mutant, ER neg, Her2/neu 0%  Cycle 1 of chemotherapy was complicated by allergic reaction to paclitaxel For cycle 2 of treatment, the plan will be to switch out paclitaxel and substitute with docetaxel Review of her blood work shows severe pancytopenia and I recommend we proceed with treatment with future dose reduction I also recommend the patient to return next week for blood draw and transfusion support if hemoglobin is less than 8 Pancytopenia, acquired (HCC) She has severe pancytopenia due to treatment I recommend further dose reduction She will return next week for CBC and blood draw and if her hemoglobin is less than 8, will proceed with 1 unit of blood In December, she had multiple tests done including B12 and iron studies and no nutritional deficiencies are noted  Orders Placed This Encounter  Procedures   CBC with Differential (Cancer Center Only)    Standing Status:   Standing    Number of Occurrences:   4    Expiration Date:   04/21/2024   Sample to Blood Bank    Standing Status:   Standing    Number of Occurrences:   4    Expiration Date:   04/21/2024     Artis Delay, MD  INTERVAL HISTORY: she returns for treatment follow-up Complications related to previous cycle of chemotherapy included  pancytopenia, She desired to return back to work and I gave her a letter to return back to work We will also complete some FMLA paperwork for her per her request  PHYSICAL EXAMINATION: ECOG PERFORMANCE STATUS: 0 - Asymptomatic  Vitals:   04/22/23 1053  BP: 126/63  Pulse: 66  Resp: 18  Temp: (!) 97.4 F (36.3 C)  SpO2: 100%   Filed Weights   04/22/23 1053  Weight: 186 lb 9.6 oz (84.6 kg)    Relevant data reviewed during this visit included CBC and CMP

## 2023-04-22 NOTE — Assessment & Plan Note (Addendum)
 She has severe pancytopenia due to treatment I recommend further dose reduction She will return next week for CBC and blood draw and if her hemoglobin is less than 8, will proceed with 1 unit of blood In December, she had multiple tests done including B12 and iron studies and no nutritional deficiencies are noted

## 2023-04-22 NOTE — Assessment & Plan Note (Addendum)
 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 P53 mixed/WT, MMRp CARIS: MSI stable. PIK3CA and p53 mutant, ER neg, Her2/neu 0%  Cycle 1 of chemotherapy was complicated by allergic reaction to paclitaxel For cycle 2 of treatment, the plan will be to switch out paclitaxel and substitute with docetaxel Review of her blood work shows severe pancytopenia and I recommend we proceed with treatment with future dose reduction I also recommend the patient to return next week for blood draw and transfusion support if hemoglobin is less than 8

## 2023-04-23 LAB — T4: T4, Total: 7.6 ug/dL (ref 4.5–12.0)

## 2023-04-25 ENCOUNTER — Inpatient Hospital Stay

## 2023-04-25 ENCOUNTER — Telehealth: Payer: Self-pay

## 2023-04-25 ENCOUNTER — Ambulatory Visit: Payer: BC Managed Care – PPO | Admitting: Radiation Oncology

## 2023-04-25 ENCOUNTER — Inpatient Hospital Stay (HOSPITAL_BASED_OUTPATIENT_CLINIC_OR_DEPARTMENT_OTHER): Admitting: Hematology and Oncology

## 2023-04-25 VITALS — BP 113/65 | HR 62 | Temp 98.0°F | Resp 18 | Ht 66.0 in | Wt 187.0 lb

## 2023-04-25 DIAGNOSIS — D61818 Other pancytopenia: Secondary | ICD-10-CM

## 2023-04-25 DIAGNOSIS — N898 Other specified noninflammatory disorders of vagina: Secondary | ICD-10-CM | POA: Diagnosis not present

## 2023-04-25 DIAGNOSIS — W19XXXA Unspecified fall, initial encounter: Secondary | ICD-10-CM | POA: Insufficient documentation

## 2023-04-25 DIAGNOSIS — C541 Malignant neoplasm of endometrium: Secondary | ICD-10-CM | POA: Diagnosis not present

## 2023-04-25 DIAGNOSIS — Y92009 Unspecified place in unspecified non-institutional (private) residence as the place of occurrence of the external cause: Secondary | ICD-10-CM | POA: Insufficient documentation

## 2023-04-25 LAB — CBC WITH DIFFERENTIAL (CANCER CENTER ONLY)
Abs Immature Granulocytes: 0.02 10*3/uL (ref 0.00–0.07)
Basophils Absolute: 0 10*3/uL (ref 0.0–0.1)
Basophils Relative: 0 %
Eosinophils Absolute: 0 10*3/uL (ref 0.0–0.5)
Eosinophils Relative: 0 %
HCT: 24.3 % — ABNORMAL LOW (ref 36.0–46.0)
Hemoglobin: 7.9 g/dL — ABNORMAL LOW (ref 12.0–15.0)
Immature Granulocytes: 1 %
Lymphocytes Relative: 23 %
Lymphs Abs: 1 10*3/uL (ref 0.7–4.0)
MCH: 24.2 pg — ABNORMAL LOW (ref 26.0–34.0)
MCHC: 32.5 g/dL (ref 30.0–36.0)
MCV: 74.5 fL — ABNORMAL LOW (ref 80.0–100.0)
Monocytes Absolute: 0.1 10*3/uL (ref 0.1–1.0)
Monocytes Relative: 2 %
Neutro Abs: 3.3 10*3/uL (ref 1.7–7.7)
Neutrophils Relative %: 74 %
Platelet Count: 169 10*3/uL (ref 150–400)
RBC: 3.26 MIL/uL — ABNORMAL LOW (ref 3.87–5.11)
RDW: 17.1 % — ABNORMAL HIGH (ref 11.5–15.5)
WBC Count: 4.4 10*3/uL (ref 4.0–10.5)
nRBC: 0 % (ref 0.0–0.2)

## 2023-04-25 LAB — PREPARE RBC (CROSSMATCH)

## 2023-04-25 LAB — SAMPLE TO BLOOD BANK

## 2023-04-25 MED ORDER — ACETAMINOPHEN 325 MG PO TABS
650.0000 mg | ORAL_TABLET | Freq: Once | ORAL | Status: AC
Start: 2023-04-25 — End: 2023-04-25
  Administered 2023-04-25: 650 mg via ORAL
  Filled 2023-04-25: qty 2

## 2023-04-25 MED ORDER — SODIUM CHLORIDE 0.9% IV SOLUTION
250.0000 mL | INTRAVENOUS | Status: DC
  Administered 2023-04-25: 100 mL via INTRAVENOUS

## 2023-04-25 NOTE — Telephone Encounter (Signed)
 Called back and given appt for today at 1215 and will see Dr. Bertis Ruddy after lab. Told her I was trying to get appt for blood transfusion today after she sees Dr. Bertis Ruddy. She is aware of appt.

## 2023-04-25 NOTE — Patient Instructions (Signed)

## 2023-04-25 NOTE — Assessment & Plan Note (Addendum)
 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 P53 mixed/WT, MMRp CARIS: MSI stable. PIK3CA and p53 mutant, ER neg, Her2/neu 0%  Cycle 1 of chemotherapy was complicated by allergic reaction to paclitaxel For cycle 2 of treatment, the plan will be to switch out paclitaxel and substitute with docetaxel Review of her blood work shows severe pancytopenia  Repeat labs today confirm worsening anemia and due to symptomatic anemia, I recommend blood transfusion support We discussed some of the risks, benefits, and alternatives of blood transfusions. The patient is symptomatic from anemia and the hemoglobin level is critically low.  Some of the side-effects to be expected including risks of transfusion reactions, chills, infection, syndrome of volume overload and risk of hospitalization from various reasons and the patient is willing to proceed and went ahead to sign consent today.

## 2023-04-25 NOTE — Progress Notes (Signed)
 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 P53 mixed/WT, MMRp CARIS: MSI stable. PIK3CA and p53 mutant, ER neg, Her2/neu 0%  Cycle 1 of chemotherapy was complicated by allergic reaction to paclitaxel For cycle 2 of treatment, the plan will be to switch out paclitaxel and substitute with docetaxel Review of her blood work shows severe pancytopenia  Repeat labs today confirm worsening anemia and due to symptomatic anemia, I recommend blood transfusion support We discussed some of the risks, benefits, and alternatives of blood transfusions. The patient is symptomatic from anemia and the hemoglobin level is critically low.  Some of the side-effects to be expected including risks of transfusion reactions, chills, infection, syndrome of volume overload and risk of hospitalization from various reasons and the patient is willing to proceed and went ahead to sign consent today.  Fall at home, initial encounter She has no injuries on exam Her fall today is likely precipitated by low blood count, of which she was symptomatic preceding the fall As above, she will receive a unit of blood I recommend the patient to take her lisinopril in the evening instead of morning in the future  Orders Placed This Encounter  Procedures   Informed Consent Details: Physician/Practitioner Attestation; Transcribe to consent form and obtain  patient signature    Standing Status:   Future    Expected Date:   04/25/2023    Expiration Date:   04/24/2024    Physician/Practitioner attestation of informed consent for blood and or blood product transfusion:   I, the physician/practitioner, attest that I have discussed with the patient the benefits, risks, side effects, alternatives, likelihood of achieving goals and potential problems during recovery for the procedure that I have provided informed consent.    Product(s):   All Product(s)   Care order/instruction    Transfuse Parameters    Standing Status:   Future    Expiration Date:   04/24/2024   Type and screen         Standing Status:   Future    Number of Occurrences:   1    Expected Date:   04/25/2023    Expiration Date:   04/24/2024   Prepare RBC (crossmatch)    Standing Status:   Standing    Number of Occurrences:   1    # of Units:   1 unit    Transfusion Indications:   Hemoglobin 8 gm/dL or less and orthopedic or cardiac surgery or pre-existing cardiac condition    Number of Units to Keep Ahead:   NO units ahead    Instructions::   Transfuse    If emergent release call blood bank:   Not emergent release     Artis Delay, MD  INTERVAL HISTORY: she returns for treatment follow-up Complications related to previous cycle of chemotherapy included fatigue, and fall She had a fall this morning due to symptoms of presyncopal weakness She fell on the right side of her body She denies loss  of consciousness  PHYSICAL EXAMINATION: ECOG PERFORMANCE STATUS: 1 - Symptomatic but completely ambulatory  Vitals:   04/25/23 1245  BP: 113/65  Pulse: 62  Resp: 18  Temp: 98 F (36.7 C)  SpO2: 100%   Filed Weights   04/25/23 1245  Weight: 187 lb (84.8 kg)   Limited examination is performed today.  She have no visible injuries and is able to move all limbs without pain Relevant data reviewed during this visit included CBC

## 2023-04-25 NOTE — Assessment & Plan Note (Addendum)
 She has no injuries on exam Her fall today is likely precipitated by low blood count, of which she was symptomatic preceding the fall As above, she will receive a unit of blood I recommend the patient to take her lisinopril in the evening instead of morning in the future

## 2023-04-25 NOTE — Telephone Encounter (Signed)
 Returned her call. She had a fall at work today walking into work. She left without going to work after the fall.  She felt hot and then went down on the ground. She has a bruise and hit her right hip somehow.  She can come in for appt today.  She needs a new note to return to work on 3/31, she never turned in her note to return to work today.

## 2023-04-26 LAB — TYPE AND SCREEN
ABO/RH(D): O POS
Antibody Screen: NEGATIVE
Unit division: 0

## 2023-04-26 LAB — BPAM RBC
Blood Product Expiration Date: 202504202359
ISSUE DATE / TIME: 202503241414
Unit Type and Rh: 5100

## 2023-04-27 ENCOUNTER — Other Ambulatory Visit: Payer: Self-pay | Admitting: Cardiology

## 2023-04-27 DIAGNOSIS — E7800 Pure hypercholesterolemia, unspecified: Secondary | ICD-10-CM

## 2023-04-29 ENCOUNTER — Encounter

## 2023-04-29 ENCOUNTER — Other Ambulatory Visit

## 2023-05-02 ENCOUNTER — Ambulatory Visit: Payer: BC Managed Care – PPO | Admitting: Radiation Oncology

## 2023-05-10 ENCOUNTER — Ambulatory Visit: Payer: BC Managed Care – PPO | Admitting: Radiation Oncology

## 2023-05-12 MED FILL — Fosaprepitant Dimeglumine For IV Infusion 150 MG (Base Eq): INTRAVENOUS | Qty: 5 | Status: AC

## 2023-05-13 ENCOUNTER — Inpatient Hospital Stay (HOSPITAL_BASED_OUTPATIENT_CLINIC_OR_DEPARTMENT_OTHER): Payer: BC Managed Care – PPO | Admitting: Hematology and Oncology

## 2023-05-13 ENCOUNTER — Other Ambulatory Visit: Payer: Self-pay

## 2023-05-13 ENCOUNTER — Encounter: Payer: Self-pay | Admitting: Hematology and Oncology

## 2023-05-13 ENCOUNTER — Other Ambulatory Visit: Payer: Self-pay | Admitting: Hematology and Oncology

## 2023-05-13 ENCOUNTER — Inpatient Hospital Stay: Payer: BC Managed Care – PPO

## 2023-05-13 ENCOUNTER — Inpatient Hospital Stay: Payer: BC Managed Care – PPO | Attending: Gynecologic Oncology

## 2023-05-13 VITALS — BP 124/44 | HR 73 | Temp 98.1°F | Resp 18 | Ht 66.0 in | Wt 189.7 lb

## 2023-05-13 VITALS — BP 110/67 | HR 69 | Temp 98.2°F | Resp 16

## 2023-05-13 DIAGNOSIS — C541 Malignant neoplasm of endometrium: Secondary | ICD-10-CM

## 2023-05-13 DIAGNOSIS — Z5112 Encounter for antineoplastic immunotherapy: Secondary | ICD-10-CM | POA: Insufficient documentation

## 2023-05-13 DIAGNOSIS — D61818 Other pancytopenia: Secondary | ICD-10-CM | POA: Insufficient documentation

## 2023-05-13 DIAGNOSIS — Z5111 Encounter for antineoplastic chemotherapy: Secondary | ICD-10-CM | POA: Insufficient documentation

## 2023-05-13 LAB — CMP (CANCER CENTER ONLY)
ALT: 5 U/L (ref 0–44)
AST: 12 U/L — ABNORMAL LOW (ref 15–41)
Albumin: 3.6 g/dL (ref 3.5–5.0)
Alkaline Phosphatase: 91 U/L (ref 38–126)
Anion gap: 6 (ref 5–15)
BUN: 13 mg/dL (ref 8–23)
CO2: 25 mmol/L (ref 22–32)
Calcium: 9 mg/dL (ref 8.9–10.3)
Chloride: 108 mmol/L (ref 98–111)
Creatinine: 0.84 mg/dL (ref 0.44–1.00)
GFR, Estimated: >60 mL/min (ref >60)
Glucose, Bld: 139 mg/dL — ABNORMAL HIGH (ref 70–99)
Potassium: 3.8 mmol/L (ref 3.5–5.1)
Sodium: 139 mmol/L (ref 135–145)
Total Bilirubin: 0.3 mg/dL (ref 0.0–1.2)
Total Protein: 7.2 g/dL (ref 6.5–8.1)

## 2023-05-13 LAB — CBC WITH DIFFERENTIAL (CANCER CENTER ONLY)
Abs Immature Granulocytes: 0.02 10*3/uL (ref 0.00–0.07)
Basophils Absolute: 0 10*3/uL (ref 0.0–0.1)
Basophils Relative: 0 %
Eosinophils Absolute: 0.1 10*3/uL (ref 0.0–0.5)
Eosinophils Relative: 3 %
HCT: 24.8 % — ABNORMAL LOW (ref 36.0–46.0)
Hemoglobin: 8 g/dL — ABNORMAL LOW (ref 12.0–15.0)
Immature Granulocytes: 0 %
Lymphocytes Relative: 16 %
Lymphs Abs: 0.8 10*3/uL (ref 0.7–4.0)
MCH: 25.2 pg — ABNORMAL LOW (ref 26.0–34.0)
MCHC: 32.3 g/dL (ref 30.0–36.0)
MCV: 78 fL — ABNORMAL LOW (ref 80.0–100.0)
Monocytes Absolute: 0.5 10*3/uL (ref 0.1–1.0)
Monocytes Relative: 10 %
Neutro Abs: 3.5 10*3/uL (ref 1.7–7.7)
Neutrophils Relative %: 71 %
Platelet Count: 232 10*3/uL (ref 150–400)
RBC: 3.18 MIL/uL — ABNORMAL LOW (ref 3.87–5.11)
RDW: 17.7 % — ABNORMAL HIGH (ref 11.5–15.5)
WBC Count: 5 10*3/uL (ref 4.0–10.5)
nRBC: 0 % (ref 0.0–0.2)

## 2023-05-13 LAB — SAMPLE TO BLOOD BANK

## 2023-05-13 LAB — PREPARE RBC (CROSSMATCH)

## 2023-05-13 MED ORDER — SODIUM CHLORIDE 0.9 % IV SOLN
150.0000 mg | Freq: Once | INTRAVENOUS | Status: AC
  Administered 2023-05-13: 150 mg via INTRAVENOUS
  Filled 2023-05-13: qty 150

## 2023-05-13 MED ORDER — SODIUM CHLORIDE 0.9 % IV SOLN
345.8000 mg | Freq: Once | INTRAVENOUS | Status: AC
  Administered 2023-05-13: 350 mg via INTRAVENOUS
  Filled 2023-05-13: qty 35

## 2023-05-13 MED ORDER — SODIUM CHLORIDE 0.9% FLUSH
10.0000 mL | Freq: Once | INTRAVENOUS | Status: AC
  Administered 2023-05-13: 10 mL

## 2023-05-13 MED ORDER — DEXAMETHASONE SODIUM PHOSPHATE 10 MG/ML IJ SOLN
10.0000 mg | Freq: Once | INTRAMUSCULAR | Status: AC
  Administered 2023-05-13: 10 mg via INTRAVENOUS
  Filled 2023-05-13: qty 1

## 2023-05-13 MED ORDER — PALONOSETRON HCL INJECTION 0.25 MG/5ML
0.2500 mg | Freq: Once | INTRAVENOUS | Status: AC
  Administered 2023-05-13: 0.25 mg via INTRAVENOUS
  Filled 2023-05-13: qty 5

## 2023-05-13 MED ORDER — FAMOTIDINE IN NACL 20-0.9 MG/50ML-% IV SOLN
20.0000 mg | Freq: Once | INTRAVENOUS | Status: AC
  Administered 2023-05-13: 20 mg via INTRAVENOUS
  Filled 2023-05-13: qty 50

## 2023-05-13 MED ORDER — SODIUM CHLORIDE 0.9 % IV SOLN: INTRAVENOUS | Status: DC

## 2023-05-13 MED ORDER — ACETAMINOPHEN 325 MG PO TABS
650.0000 mg | ORAL_TABLET | Freq: Once | ORAL | Status: AC
  Administered 2023-05-13: 650 mg via ORAL
  Filled 2023-05-13: qty 2

## 2023-05-13 MED ORDER — SODIUM CHLORIDE 0.9 % IV SOLN
48.0000 mg/m2 | Freq: Once | INTRAVENOUS | Status: AC
  Administered 2023-05-13: 93 mg via INTRAVENOUS
  Filled 2023-05-13: qty 9.3

## 2023-05-13 MED ORDER — SODIUM CHLORIDE 0.9 % IV SOLN
500.0000 mg | Freq: Once | INTRAVENOUS | Status: AC
  Administered 2023-05-13: 500 mg via INTRAVENOUS
  Filled 2023-05-13: qty 10

## 2023-05-13 MED ORDER — CETIRIZINE HCL 10 MG/ML IV SOLN
10.0000 mg | Freq: Once | INTRAVENOUS | Status: AC
  Administered 2023-05-13: 10 mg via INTRAVENOUS

## 2023-05-13 MED ORDER — SODIUM CHLORIDE 0.9% IV SOLUTION
250.0000 mL | INTRAVENOUS | Status: DC
  Administered 2023-05-13: 250 mL via INTRAVENOUS

## 2023-05-13 NOTE — Assessment & Plan Note (Addendum)
 She has severe pancytopenia due to treatment In December, she had multiple tests done including B12 and iron studies and no nutritional deficiencies are noted Recommend a unit of blood transfusion along with chemotherapy and she agreed to proceed

## 2023-05-13 NOTE — Patient Instructions (Signed)
 [facility] - A DEPT OF [facility]  Discharge Instructions: Thank you for choosing [facility] to provide your oncology and hematology care.   If you have a lab appointment with the Cancer Center, please go directly to the Cancer Center and check in at the registration area.   Wear comfortable clothing and clothing appropriate for easy access to any Portacath or PICC line.   We strive to give you quality time with your provider. You may need to reschedule your appointment if you arrive late (15 or more minutes).  Arriving late affects you and other patients whose appointments are after yours.  Also, if you miss three or more appointments without notifying the office, you may be dismissed from the clinic at the provider's discretion.      For prescription refill requests, have your pharmacy contact our office and allow 72 hours for refills to be completed.    Today you received the following chemotherapy and/or immunotherapy agents: Jemperli/Docetaxel/Carboplatin      To help prevent nausea and vomiting after your treatment, we encourage you to take your nausea medication as directed.  BELOW ARE SYMPTOMS THAT SHOULD BE REPORTED IMMEDIATELY: *FEVER GREATER THAN 100.4 F (38 C) OR HIGHER *CHILLS OR SWEATING *NAUSEA AND VOMITING THAT IS NOT CONTROLLED WITH YOUR NAUSEA MEDICATION *UNUSUAL SHORTNESS OF BREATH *UNUSUAL BRUISING OR BLEEDING *URINARY PROBLEMS (pain or burning when urinating, or frequent urination) *BOWEL PROBLEMS (unusual diarrhea, constipation, pain near the anus) TENDERNESS IN MOUTH AND THROAT WITH OR WITHOUT PRESENCE OF ULCERS (sore throat, sores in mouth, or a toothache) UNUSUAL RASH, SWELLING OR PAIN  UNUSUAL VAGINAL DISCHARGE OR ITCHING   Items with * indicate a potential emergency and should be followed up as soon as possible or go to the Emergency Department if any problems should occur.  Please show the CHEMOTHERAPY ALERT  CARD or IMMUNOTHERAPY ALERT CARD at check-in to the Emergency Department and triage nurse.  Should you have questions after your visit or need to cancel or reschedule your appointment, please contact [facility] - A DEPT OF [facility]  Dept: [phone]  and follow the prompts.  Office hours are 8:00 a.m. to 4:30 p.m. Monday - Friday. Please note that voicemails left after 4:00 p.m. may not be returned until the following business day.  We are closed weekends and major holidays. You have access to a nurse at all times for urgent questions. Please call the main number to the clinic Dept: [phone] and follow the prompts.   For any non-urgent questions, you may also contact your provider using MyChart. We now offer e-Visits for anyone 57 and older to request care online for non-urgent symptoms. For details visit [email or web address].   Also download the MyChart app! Go to the app store, search "MyChart", open the app, select [place], and log in with your MyChart username and password.  Blood Transfusion, Adult, Care After After a blood transfusion, it is common to have: Bruising and soreness at the IV site. A headache. Follow these instructions at home: Your doctor may give you more instructions. If you have problems, contact your doctor. Insertion site care     Follow instructions from your doctor about how to take care of your insertion site. This is where an IV tube was put into your vein. Make sure you: Wash your hands with soap and water for at least 20 seconds before and after  you change your bandage. If you cannot use soap and water, use hand sanitizer. Change your bandage as told by your doctor. Check your insertion site every day for signs of infection. Check for: Redness, swelling, or pain. Bleeding from the site. Warmth. Pus or a bad smell. General instructions Take over-the-counter and prescription medicines only as told by your  doctor. Rest as told by your doctor. Go back to your normal activities as told by your doctor. Keep all follow-up visits. You may need to have tests at certain times to check your blood. Contact a doctor if: You have itching or red, swollen areas of skin (hives). You have a fever or chills. You have pain in the head, back, or chest. You feel worried or nervous (anxious). You feel weak after doing your normal activities. You have any of these problems at the insertion site: Redness, swelling, warmth, or pain. Bleeding that does not stop with pressure. Pus or a bad smell. If you received your blood transfusion in an outpatient setting, you will be told whom to contact to report any reactions. Get help right away if: You have signs of a serious reaction. This may be coming from an allergy or the body's defense system (immune system). Signs include: Trouble breathing or shortness of breath. Swelling of the face or feeling warm (flushed). A widespread rash. Dark pee (urine) or blood in the pee. Fast heartbeat. These symptoms may be an emergency. Get help right away. Call 911. Do not wait to see if the symptoms will go away. Do not drive yourself to the hospital. Summary Bruising and soreness at the IV site are common. Check your insertion site every day for signs of infection. Rest as told by your doctor. Go back to your normal activities as told by your doctor. Get help right away if you have signs of a serious reaction. This information is not intended to replace advice given to you by your health care provider. Make sure you discuss any questions you have with your health care provider. Document Revised: 04/17/2021 Document Reviewed: 04/17/2021 Elsevier Patient Education  2024 [employer].

## 2023-05-13 NOTE — Progress Notes (Signed)
 Okay to run PRBC's 327mL/hr per Bertis Ruddy, MD

## 2023-05-13 NOTE — Progress Notes (Signed)
 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 P53 mixed/WT, MMRp CARIS: MSI stable. PIK3CA and p53 mutant, ER neg, Her2/neu 0%  Cycle 1 of chemotherapy was complicated by allergic reaction to paclitaxel For cycle 2 of treatment, the plan will be to switch out paclitaxel and substitute with docetaxel and she received transfusion support She is quite anemic again and is symptomatic Will proceed with treatment without delay with additional blood transfusion support  Pancytopenia, acquired [facility]) She has severe pancytopenia due to treatment In December, she had multiple tests done including B12 and iron studies and no nutritional deficiencies are noted Recommend a unit of blood transfusion along with chemotherapy and she agreed to proceed  Orders Placed This Encounter  Procedures   Informed Consent Details: Physician/Practitioner Attestation; Transcribe to consent form and obtain patient signature    Standing Status:   Future    Expected Date:   05/13/2023    Expiration Date:   05/12/2024    Physician/Practitioner attestation of informed consent for blood and or blood product transfusion:   I, the physician/practitioner, attest that I have discussed with the patient the benefits, risks, side effects, alternatives, likelihood of achieving goals and potential problems during recovery for the procedure that I have  provided informed consent.    Product(s):   All Product(s)   Care order/instruction    Transfuse Parameters    Standing Status:   Future    Expiration Date:   05/12/2024   Type and screen         Standing Status:   Future    Number of Occurrences:   1    Expected Date:   05/13/2023    Expiration Date:   05/12/2024   Prepare RBC (crossmatch)    Standing Status:   Standing    Number of Occurrences:   1    # of Units:   1 unit    Transfusion Indications:   Hemoglobin 8 gm/dL or less and orthopedic or cardiac surgery or pre-existing cardiac condition    Number of Units to Keep Ahead:   NO units ahead    Instructions::   Transfuse    If emergent release call blood bank:   Not emergent release     Artis Delay, MD  INTERVAL HISTORY: she returns for treatment follow-up Complications related to previous cycle of chemotherapy included anemia, and low blood pressure  PHYSICAL EXAMINATION: ECOG PERFORMANCE STATUS: 1 - Symptomatic but completely ambulatory  Vitals:   05/13/23 1005  BP: (!) 124/44  Pulse: 73  Resp: 18  Temp: 98.1 F (36.7 C)  SpO2: 100%   Filed Weights   05/13/23 1005  Weight: 189 lb 11.2 oz (86 kg)    Relevant data reviewed during this visit included CBC and CMP

## 2023-05-13 NOTE — Assessment & Plan Note (Addendum)
 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 P53 mixed/WT, MMRp CARIS: MSI stable. PIK3CA and p53 mutant, ER neg, Her2/neu 0%  Cycle 1 of chemotherapy was complicated by allergic reaction to paclitaxel For cycle 2 of treatment, the plan will be to switch out paclitaxel and substitute with docetaxel and she received transfusion support She is quite anemic again and is symptomatic Will proceed with treatment without delay with additional blood transfusion support

## 2023-05-16 LAB — TYPE AND SCREEN
ABO/RH(D): O POS
Antibody Screen: NEGATIVE
Unit division: 0

## 2023-05-16 LAB — BPAM RBC
Blood Product Expiration Date: 202505112359
ISSUE DATE / TIME: 202504111347
Unit Type and Rh: 5100

## 2023-06-02 MED FILL — Fosaprepitant Dimeglumine For IV Infusion 150 MG (Base Eq): INTRAVENOUS | Qty: 5 | Status: AC

## 2023-06-03 ENCOUNTER — Encounter: Payer: Self-pay | Admitting: Hematology and Oncology

## 2023-06-03 ENCOUNTER — Inpatient Hospital Stay: Attending: Gynecologic Oncology

## 2023-06-03 ENCOUNTER — Inpatient Hospital Stay (HOSPITAL_BASED_OUTPATIENT_CLINIC_OR_DEPARTMENT_OTHER): Admitting: Hematology and Oncology

## 2023-06-03 ENCOUNTER — Inpatient Hospital Stay

## 2023-06-03 VITALS — BP 136/64 | HR 58 | Temp 97.8°F | Resp 16

## 2023-06-03 VITALS — BP 129/64 | HR 62 | Temp 98.5°F | Resp 18 | Ht 66.0 in | Wt 185.4 lb

## 2023-06-03 DIAGNOSIS — Z5112 Encounter for antineoplastic immunotherapy: Secondary | ICD-10-CM | POA: Insufficient documentation

## 2023-06-03 DIAGNOSIS — Z5111 Encounter for antineoplastic chemotherapy: Secondary | ICD-10-CM | POA: Insufficient documentation

## 2023-06-03 DIAGNOSIS — C541 Malignant neoplasm of endometrium: Secondary | ICD-10-CM | POA: Insufficient documentation

## 2023-06-03 DIAGNOSIS — R7989 Other specified abnormal findings of blood chemistry: Secondary | ICD-10-CM | POA: Diagnosis not present

## 2023-06-03 DIAGNOSIS — Z79620 Long term (current) use of immunosuppressive biologic: Secondary | ICD-10-CM | POA: Insufficient documentation

## 2023-06-03 DIAGNOSIS — D509 Iron deficiency anemia, unspecified: Secondary | ICD-10-CM | POA: Insufficient documentation

## 2023-06-03 DIAGNOSIS — D61818 Other pancytopenia: Secondary | ICD-10-CM

## 2023-06-03 DIAGNOSIS — N939 Abnormal uterine and vaginal bleeding, unspecified: Secondary | ICD-10-CM | POA: Insufficient documentation

## 2023-06-03 LAB — CBC WITH DIFFERENTIAL (CANCER CENTER ONLY)
Abs Immature Granulocytes: 0.01 10*3/uL (ref 0.00–0.07)
Basophils Absolute: 0 10*3/uL (ref 0.0–0.1)
Basophils Relative: 0 %
Eosinophils Absolute: 0.1 10*3/uL (ref 0.0–0.5)
Eosinophils Relative: 2 %
HCT: 24 % — ABNORMAL LOW (ref 36.0–46.0)
Hemoglobin: 8 g/dL — ABNORMAL LOW (ref 12.0–15.0)
Immature Granulocytes: 0 %
Lymphocytes Relative: 17 %
Lymphs Abs: 0.7 10*3/uL (ref 0.7–4.0)
MCH: 26.1 pg (ref 26.0–34.0)
MCHC: 33.3 g/dL (ref 30.0–36.0)
MCV: 78.2 fL — ABNORMAL LOW (ref 80.0–100.0)
Monocytes Absolute: 0.6 10*3/uL (ref 0.1–1.0)
Monocytes Relative: 14 %
Neutro Abs: 2.9 10*3/uL (ref 1.7–7.7)
Neutrophils Relative %: 67 %
Platelet Count: 200 10*3/uL (ref 150–400)
RBC: 3.07 MIL/uL — ABNORMAL LOW (ref 3.87–5.11)
RDW: 16.6 % — ABNORMAL HIGH (ref 11.5–15.5)
WBC Count: 4.3 10*3/uL (ref 4.0–10.5)
nRBC: 0 % (ref 0.0–0.2)

## 2023-06-03 LAB — PREPARE RBC (CROSSMATCH)

## 2023-06-03 LAB — CMP (CANCER CENTER ONLY)
ALT: <5 U/L (ref 0–44)
AST: 10 U/L — ABNORMAL LOW (ref 15–41)
Albumin: 3.7 g/dL (ref 3.5–5.0)
Alkaline Phosphatase: 66 U/L (ref 38–126)
Anion gap: 7 (ref 5–15)
BUN: 14 mg/dL (ref 8–23)
CO2: 25 mmol/L (ref 22–32)
Calcium: 8.8 mg/dL — ABNORMAL LOW (ref 8.9–10.3)
Chloride: 107 mmol/L (ref 98–111)
Creatinine: 1.19 mg/dL — ABNORMAL HIGH (ref 0.44–1.00)
GFR, Estimated: 51 mL/min — ABNORMAL LOW (ref >60)
Glucose, Bld: 97 mg/dL (ref 70–99)
Potassium: 3.7 mmol/L (ref 3.5–5.1)
Sodium: 139 mmol/L (ref 135–145)
Total Bilirubin: 0.6 mg/dL (ref 0.0–1.2)
Total Protein: 7.1 g/dL (ref 6.5–8.1)

## 2023-06-03 LAB — SAMPLE TO BLOOD BANK

## 2023-06-03 MED ORDER — PALONOSETRON HCL INJECTION 0.25 MG/5ML
0.2500 mg | Freq: Once | INTRAVENOUS | Status: AC
Start: 2023-06-03 — End: 2023-06-03
  Administered 2023-06-03: 0.25 mg via INTRAVENOUS
  Filled 2023-06-03: qty 5

## 2023-06-03 MED ORDER — SODIUM CHLORIDE 0.9 % IV SOLN
150.0000 mg | Freq: Once | INTRAVENOUS | Status: AC
  Administered 2023-06-03: 150 mg via INTRAVENOUS
  Filled 2023-06-03: qty 150

## 2023-06-03 MED ORDER — ACETAMINOPHEN 325 MG PO TABS
650.0000 mg | ORAL_TABLET | Freq: Once | ORAL | Status: AC
Start: 2023-06-03 — End: 2023-06-03
  Administered 2023-06-03: 650 mg via ORAL
  Filled 2023-06-03: qty 2

## 2023-06-03 MED ORDER — SODIUM CHLORIDE 0.9 % IV SOLN
310.0000 mg | Freq: Once | INTRAVENOUS | Status: AC
  Administered 2023-06-03: 310 mg via INTRAVENOUS
  Filled 2023-06-03: qty 31

## 2023-06-03 MED ORDER — SODIUM CHLORIDE 0.9% FLUSH
10.0000 mL | INTRAVENOUS | Status: DC | PRN
  Administered 2023-06-03: 10 mL

## 2023-06-03 MED ORDER — HEPARIN SOD (PORK) LOCK FLUSH 100 UNIT/ML IV SOLN
500.0000 [IU] | Freq: Once | INTRAVENOUS | Status: AC | PRN
  Administered 2023-06-03: 500 [IU]

## 2023-06-03 MED ORDER — DEXAMETHASONE SODIUM PHOSPHATE 10 MG/ML IJ SOLN
10.0000 mg | Freq: Once | INTRAMUSCULAR | Status: AC
Start: 2023-06-03 — End: 2023-06-03
  Administered 2023-06-03: 10 mg via INTRAVENOUS
  Filled 2023-06-03: qty 1

## 2023-06-03 MED ORDER — LIDOCAINE-PRILOCAINE 2.5-2.5 % EX CREA: TOPICAL_CREAM | CUTANEOUS | 3 refills | Status: DC

## 2023-06-03 MED ORDER — SODIUM CHLORIDE 0.9% FLUSH
10.0000 mL | Freq: Once | INTRAVENOUS | Status: AC
  Administered 2023-06-03: 10 mL

## 2023-06-03 MED ORDER — SODIUM CHLORIDE 0.9 % IV SOLN: INTRAVENOUS | Status: DC

## 2023-06-03 MED ORDER — SODIUM CHLORIDE 0.9 % IV SOLN
48.0000 mg/m2 | Freq: Once | INTRAVENOUS | Status: AC
  Administered 2023-06-03: 93 mg via INTRAVENOUS
  Filled 2023-06-03: qty 9.3

## 2023-06-03 MED ORDER — SODIUM CHLORIDE 0.9 % IV SOLN
500.0000 mg | Freq: Once | INTRAVENOUS | Status: AC
  Administered 2023-06-03: 500 mg via INTRAVENOUS
  Filled 2023-06-03: qty 10

## 2023-06-03 MED ORDER — FAMOTIDINE IN NACL 20-0.9 MG/50ML-% IV SOLN
20.0000 mg | Freq: Once | INTRAVENOUS | Status: AC
  Administered 2023-06-03: 20 mg via INTRAVENOUS
  Filled 2023-06-03: qty 50

## 2023-06-03 MED ORDER — CETIRIZINE HCL 10 MG/ML IV SOLN
10.0000 mg | Freq: Once | INTRAVENOUS | Status: AC
  Administered 2023-06-03: 10 mg via INTRAVENOUS
  Filled 2023-06-03: qty 1

## 2023-06-03 NOTE — Progress Notes (Signed)
 Decrease carbo 310mg  based on today's SCr=1.19 per Dr Marton Sleeper

## 2023-06-03 NOTE — Assessment & Plan Note (Addendum)
 Could be due to risk factor/diabetes I will adjust the dose of carboplatin  today

## 2023-06-03 NOTE — Assessment & Plan Note (Addendum)
 She has severe pancytopenia due to treatment In December, she had multiple tests done including B12 and iron studies and no nutritional deficiencies are noted Recommend a unit of blood transfusion along with chemotherapy and she agreed to proceed

## 2023-06-03 NOTE — Patient Instructions (Signed)
 [facility] - A DEPT OF [facility]  Discharge Instructions: Thank you for choosing [facility] to provide your oncology and hematology care.   If you have a lab appointment with the Cancer Center, please go directly to the Cancer Center and check in at the registration area.   Wear comfortable clothing and clothing appropriate for easy access to any Portacath or PICC line.   We strive to give you quality time with your provider. You may need to reschedule your appointment if you arrive late (15 or more minutes).  Arriving late affects you and other patients whose appointments are after yours.  Also, if you miss three or more appointments without notifying the office, you may be dismissed from the clinic at the provider's discretion.      For prescription refill requests, have your pharmacy contact our office and allow 72 hours for refills to be completed.    Today you received the following chemotherapy and/or immunotherapy agents: Jemperli , Docetaxel , Carboplatin .       To help prevent nausea and vomiting after your treatment, we encourage you to take your nausea medication as directed.  BELOW ARE SYMPTOMS THAT SHOULD BE REPORTED IMMEDIATELY: *FEVER GREATER THAN 100.4 F (38 C) OR HIGHER *CHILLS OR SWEATING *NAUSEA AND VOMITING THAT IS NOT CONTROLLED WITH YOUR NAUSEA MEDICATION *UNUSUAL SHORTNESS OF BREATH *UNUSUAL BRUISING OR BLEEDING *URINARY PROBLEMS (pain or burning when urinating, or frequent urination) *BOWEL PROBLEMS (unusual diarrhea, constipation, pain near the anus) TENDERNESS IN MOUTH AND THROAT WITH OR WITHOUT PRESENCE OF ULCERS (sore throat, sores in mouth, or a toothache) UNUSUAL RASH, SWELLING OR PAIN  UNUSUAL VAGINAL DISCHARGE OR ITCHING   Items with * indicate a potential emergency and should be followed up as soon as possible or go to the Emergency Department if any problems should occur.  Please show the CHEMOTHERAPY  ALERT CARD or IMMUNOTHERAPY ALERT CARD at check-in to the Emergency Department and triage nurse.  Should you have questions after your visit or need to cancel or reschedule your appointment, please contact [facility] - A DEPT OF [facility]  Dept: [phone]  and follow the prompts.  Office hours are 8:00 a.m. to 4:30 p.m. Monday - Friday. Please note that voicemails left after 4:00 p.m. may not be returned until the following business day.  We are closed weekends and major holidays. You have access to a nurse at all times for urgent questions. Please call the main number to the clinic Dept: [phone] and follow the prompts.   For any non-urgent questions, you may also contact your provider using MyChart. We now offer e-Visits for anyone 45 and older to request care online for non-urgent symptoms. For details visit [email or web address].   Also download the MyChart app! Go to the app store, search "MyChart", open the app, select [facility], and log in with your MyChart username and password.  Blood Transfusion, Adult, Care After The following information offers guidance on how to care for yourself after your procedure. Your health care provider may also give you more specific instructions. If you have problems or questions, contact your health care provider. What can I expect after the procedure? After the procedure, it is common to have: Bruising and soreness where the IV was inserted. A headache. Follow these instructions at home: IV insertion site care     Follow instructions from your health care provider about how to  take care of your IV insertion site. Make sure you: Wash your hands with soap and water  for at least 20 seconds before and after you change your bandage (dressing). If soap and water  are not available, use hand sanitizer. Change your dressing as told by your health care provider. Check your IV insertion site every day for signs of  infection. Check for: Redness, swelling, or pain. Bleeding from the site. Warmth. Pus or a bad smell. General instructions Take over-the-counter and prescription medicines only as told by your health care provider. Rest as told by your health care provider. Return to your normal activities as told by your health care provider. Keep all follow-up visits. Lab tests may need to be done at certain periods to recheck your blood counts. Contact a health care provider if: You have itching or red, swollen areas of skin (hives). You have a fever or chills. You have pain in the head, back, or chest. You feel anxious or you feel weak after doing your normal activities. You have redness, swelling, warmth, or pain around the IV insertion site. You have blood coming from the IV insertion site that does not stop with pressure. You have pus or a bad smell coming from your IV insertion site. If you received your blood transfusion in an outpatient setting, you will be told whom to contact to report any reactions. Get help right away if: You have symptoms of a serious allergic or immune system reaction, including: Trouble breathing or shortness of breath. Swelling of the face, feeling flushed, or widespread rash. Dark urine or blood in the urine. Fast heartbeat. These symptoms may be an emergency. Get help right away. Call 911. Do not wait to see if the symptoms will go away. Do not drive yourself to the hospital. Summary Bruising and soreness around the IV insertion site are common. Check your IV insertion site every day for signs of infection. Rest as told by your health care provider. Return to your normal activities as told by your health care provider. Get help right away for symptoms of a serious allergic or immune system reaction to the blood transfusion. This information is not intended to replace advice given to you by your health care provider. Make sure you discuss any questions you have  with your health care provider. Document Revised: 04/17/2021 Document Reviewed: 04/17/2021 Elsevier Patient Education  2024 [employer].

## 2023-06-03 NOTE — Progress Notes (Signed)
 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 P53 mixed/WT, MMRp CARIS: MSI stable. PIK3CA and p53 mutant, ER neg, Her2/neu 0%  Cycle 1 of chemotherapy was complicated by allergic reaction to paclitaxel  For cycle 2 of treatment, the plan will be to switch out paclitaxel  and substitute with docetaxel  and she received transfusion support She is quite anemic again and is symptomatic Will proceed with treatment without delay with additional blood transfusion support today  Pancytopenia, acquired (HCC) She has severe pancytopenia due to treatment In December, she had multiple tests done including B12 and iron  studies and no nutritional deficiencies are noted Recommend a unit of blood transfusion along with chemotherapy and she agreed to proceed Elevated serum creatinine Could be due to risk factor/diabetes I will adjust the dose of carboplatin  today Vaginal spotting Could be due to delayed wound healing As above, she will receive transfusion support that I am hoping will speed up recovery of her vaginal cuff  Orders Placed This Encounter  Procedures   Informed Consent Details: Physician/Practitioner Attestation; Transcribe to consent form and obtain patient signature    Standing Status:   Future    Expected Date:   06/03/2023    Expiration Date:   06/02/2024    Physician/Practitioner attestation of informed  consent for blood and or blood product transfusion:   I, the physician/practitioner, attest that I have discussed with the patient the benefits, risks, side effects, alternatives, likelihood of achieving goals and potential problems during recovery for the procedure that I have provided informed consent.    Product(s):   All Product(s)   Care order/instruction    Transfuse Parameters    Standing Status:   Future    Expiration Date:   06/02/2024   Type and screen         Standing Status:   Future    Number of Occurrences:   1    Expected Date:   06/03/2023    Expiration Date:   06/02/2024     [name], MD  INTERVAL HISTORY: she returns for treatment follow-up Complications related to previous cycle of chemotherapy included anemia, and changes in kidney function and vaginal spotting She denies nausea, changes in bowel habits or peripheral neuropathy She has symptomatic fatigue from treatment Her vaginal spotting is daily but not severe PHYSICAL EXAMINATION: ECOG PERFORMANCE STATUS: 1 - Symptomatic but completely ambulatory  Vitals:   06/03/23 1022  BP: 129/64  Pulse: 62  Resp: 18  Temp: 98.5 F (36.9 C)  SpO2: 100%   Filed Weights   06/03/23 1022  Weight: 185 lb 6.4 oz (84.1 kg)    Relevant data reviewed during this visit included CBC and CMP

## 2023-06-03 NOTE — Assessment & Plan Note (Addendum)
 Could be due to delayed wound healing As above, she will receive transfusion support that I am hoping will speed up recovery of her vaginal cuff

## 2023-06-03 NOTE — Assessment & Plan Note (Addendum)
 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 P53 mixed/WT, MMRp CARIS: MSI stable. PIK3CA and p53 mutant, ER neg, Her2/neu 0%  Cycle 1 of chemotherapy was complicated by allergic reaction to paclitaxel  For cycle 2 of treatment, the plan will be to switch out paclitaxel  and substitute with docetaxel  and she received transfusion support She is quite anemic again and is symptomatic Will proceed with treatment without delay with additional blood transfusion support today

## 2023-06-06 LAB — TYPE AND SCREEN
ABO/RH(D): O POS
Antibody Screen: NEGATIVE
Unit division: 0

## 2023-06-06 LAB — BPAM RBC
Blood Product Expiration Date: 202506022359
ISSUE DATE / TIME: 202505021210
Unit Type and Rh: 5100

## 2023-06-20 ENCOUNTER — Telehealth: Payer: Self-pay

## 2023-06-20 ENCOUNTER — Other Ambulatory Visit: Payer: Self-pay | Admitting: Hematology and Oncology

## 2023-06-20 MED ORDER — LACTULOSE 10 GM/15ML PO SOLN: 10.0000 g | Freq: Three times a day (TID) | ORAL | 0 refills | Status: DC

## 2023-06-20 NOTE — Telephone Encounter (Signed)
 I sent lactulose  to her pharmacy Continue miralax Avoid fleets enema or suppository Call her back tomorrow afternoon and see if it works

## 2023-06-20 NOTE — Telephone Encounter (Signed)
 Called and given below message. She verbalized understanding and will start lactulose  today. She will call the office back tomorrow with update on constipation.

## 2023-06-20 NOTE — Telephone Encounter (Signed)
 Returned her call. She is complaining of constipation for 7-8 days. Last bm 5/16. She is having a hard time eating and feeling uncomfortable. She is taking miralax  2 x day. Yesterday she took ex lax, dulcolax and fleets enema with no bm.  She is asking for a strong [facility]ative [facility] or what does Dr. Marton Sleeper recommend.

## 2023-06-21 ENCOUNTER — Telehealth: Payer: Self-pay

## 2023-06-21 NOTE — Telephone Encounter (Signed)
 It has been over 2 weeks since last chemo, ok to use enema I recommend fleet enema or dulcolax suppository, recommend desk RN to call her tomorrow afternoon to check on her

## 2023-06-21 NOTE — Telephone Encounter (Addendum)
 Called to see if she had a bm. She is taking miralax BID and started Lactulose  TID. No formed stool since starting lactulose . She is passing liquid brown stool only from her rectum for 3 days. She is having a hard time eating. She is pushing oral fluids. She is complaining that she has feels like the stool is stuck in her rectum. When she attempts to pass stool only liquid comes out. She is passing some gas and having a cramping sensation.

## 2023-06-21 NOTE — Telephone Encounter (Signed)
 Called and given below message. She verbalized understanding and will try fleets enema. She will continue laxatives. She will call the office back for questions/concerns. Told her the office will call her tomorrow to check on her. She verbalized understanding.

## 2023-06-22 ENCOUNTER — Telehealth: Payer: Self-pay

## 2023-06-22 ENCOUNTER — Other Ambulatory Visit: Payer: Self-pay | Admitting: Hematology and Oncology

## 2023-06-22 ENCOUNTER — Telehealth: Payer: Self-pay | Admitting: *Deleted

## 2023-06-22 MED ORDER — MAGNESIUM CITRATE PO SOLN: 1.0000 | Freq: Once | ORAL | 0 refills | Status: AC

## 2023-06-22 NOTE — Telephone Encounter (Signed)
 Checked-in with patient regarding success with fleet enema. Patient reports that she did the enema yesterday evening around 8 PM, but has not been able to have a bowel movement. Patient reports passing gas, but no stool. She continues to use the lactulose  and miralax. Dr. Marton Sleeper aware and recommends for the patient to continue using the lactulose  and miralax. Patient was also sent in a prescription for magnesium citrate. Patient advised to pick up this medication. Side effects reviewed with patient.  Patient verbalized an understanding of the information and reports that she will pick-up the medication today.  Patient knows to expect a phone call from the office tomorrow afternoon to check-in on how she is doing. Patient knows to callback sooner should she have any questions or concerns.

## 2023-06-22 NOTE — Telephone Encounter (Signed)
 Spoke with Danielle Mullins for meaningful use call and reminder of appt. With Dr. Orvil Bland on Thursday, May 29 th. Pt states she won't be able to make that appointment and would like to reschedule for a later date. Pt was given an appt. For August 14 th at 2 pm. Pt agreed to date and time and had no further questions at this time.

## 2023-06-23 ENCOUNTER — Other Ambulatory Visit: Payer: Self-pay

## 2023-06-23 ENCOUNTER — Telehealth: Payer: Self-pay

## 2023-06-23 NOTE — Telephone Encounter (Signed)
 Patient called to evaluate status of constipation.  Patient reports no success with having a bowel movement. She continues to take lactulose  and miralax with no success.  She had been prescribed magnesium citrate, but was not able to pick it up at the pharmacy.  RN called and [facility] [address] who noted that they only fill this as a prescription in certain cases. However, patient should have been directed to pick it up OTC.  Called patient back to encourage her to pick-up OTC magnesium citrate. The pharmacy staff stated that they would be happy to assist her with locating the medication in the store. Patient verbalized an understanding of the information. RN also reiterated and confirmed tomorrow's appointment times with the patient.  Patient knows to call back with any additional questions or concerns.

## 2023-06-24 ENCOUNTER — Encounter: Payer: Self-pay | Admitting: Hematology and Oncology

## 2023-06-24 ENCOUNTER — Inpatient Hospital Stay

## 2023-06-24 ENCOUNTER — Inpatient Hospital Stay (HOSPITAL_BASED_OUTPATIENT_CLINIC_OR_DEPARTMENT_OTHER): Admitting: Hematology and Oncology

## 2023-06-24 VITALS — BP 137/69 | HR 90 | Temp 98.4°F | Resp 18 | Ht 66.0 in | Wt 176.0 lb

## 2023-06-24 VITALS — BP 118/61 | HR 60 | Temp 97.8°F | Resp 16

## 2023-06-24 DIAGNOSIS — K5909 Other constipation: Secondary | ICD-10-CM | POA: Diagnosis not present

## 2023-06-24 DIAGNOSIS — R7989 Other specified abnormal findings of blood chemistry: Secondary | ICD-10-CM | POA: Diagnosis not present

## 2023-06-24 DIAGNOSIS — C541 Malignant neoplasm of endometrium: Secondary | ICD-10-CM | POA: Diagnosis not present

## 2023-06-24 DIAGNOSIS — D509 Iron deficiency anemia, unspecified: Secondary | ICD-10-CM

## 2023-06-24 DIAGNOSIS — D61818 Other pancytopenia: Secondary | ICD-10-CM

## 2023-06-24 DIAGNOSIS — Z5112 Encounter for antineoplastic immunotherapy: Secondary | ICD-10-CM | POA: Diagnosis not present

## 2023-06-24 LAB — CMP (CANCER CENTER ONLY)
ALT: 6 U/L (ref 0–44)
AST: 14 U/L — ABNORMAL LOW (ref 15–41)
Albumin: 3.6 g/dL (ref 3.5–5.0)
Alkaline Phosphatase: 54 U/L (ref 38–126)
Anion gap: 11 (ref 5–15)
BUN: 17 mg/dL (ref 8–23)
CO2: 26 mmol/L (ref 22–32)
Calcium: 9 mg/dL (ref 8.9–10.3)
Chloride: 100 mmol/L (ref 98–111)
Creatinine: 1.39 mg/dL — ABNORMAL HIGH (ref 0.44–1.00)
GFR, Estimated: 43 mL/min — ABNORMAL LOW (ref >60)
Glucose, Bld: 103 mg/dL — ABNORMAL HIGH (ref 70–99)
Potassium: 4.2 mmol/L (ref 3.5–5.1)
Sodium: 137 mmol/L (ref 135–145)
Total Bilirubin: 0.4 mg/dL (ref 0.0–1.2)
Total Protein: 7.6 g/dL (ref 6.5–8.1)

## 2023-06-24 LAB — CBC WITH DIFFERENTIAL (CANCER CENTER ONLY)
Abs Immature Granulocytes: 0.02 10*3/uL (ref 0.00–0.07)
Basophils Absolute: 0 10*3/uL (ref 0.0–0.1)
Basophils Relative: 0 %
Eosinophils Absolute: 0 10*3/uL (ref 0.0–0.5)
Eosinophils Relative: 1 %
HCT: 22.9 % — ABNORMAL LOW (ref 36.0–46.0)
Hemoglobin: 7.7 g/dL — ABNORMAL LOW (ref 12.0–15.0)
Immature Granulocytes: 0 %
Lymphocytes Relative: 11 %
Lymphs Abs: 0.6 10*3/uL — ABNORMAL LOW (ref 0.7–4.0)
MCH: 26.9 pg (ref 26.0–34.0)
MCHC: 33.6 g/dL (ref 30.0–36.0)
MCV: 80.1 fL (ref 80.0–100.0)
Monocytes Absolute: 0.6 10*3/uL (ref 0.1–1.0)
Monocytes Relative: 12 %
Neutro Abs: 3.9 10*3/uL (ref 1.7–7.7)
Neutrophils Relative %: 76 %
Platelet Count: 280 10*3/uL (ref 150–400)
RBC: 2.86 MIL/uL — ABNORMAL LOW (ref 3.87–5.11)
RDW: 14.7 % (ref 11.5–15.5)
WBC Count: 5.2 10*3/uL (ref 4.0–10.5)
nRBC: 0 % (ref 0.0–0.2)

## 2023-06-24 LAB — SAMPLE TO BLOOD BANK

## 2023-06-24 LAB — TSH: TSH: 1.58 u[IU]/mL (ref 0.350–4.500)

## 2023-06-24 LAB — PREPARE RBC (CROSSMATCH)

## 2023-06-24 MED ORDER — SODIUM CHLORIDE 0.9 % IV SOLN
270.0000 mg | Freq: Once | INTRAVENOUS | Status: AC
  Administered 2023-06-24: 270 mg via INTRAVENOUS
  Filled 2023-06-24: qty 27

## 2023-06-24 MED ORDER — CETIRIZINE HCL 10 MG/ML IV SOLN
10.0000 mg | Freq: Once | INTRAVENOUS | Status: AC
  Administered 2023-06-24: 10 mg via INTRAVENOUS
  Filled 2023-06-24: qty 1

## 2023-06-24 MED ORDER — SODIUM CHLORIDE 0.9 % IV SOLN
36.0000 mg/m2 | Freq: Once | INTRAVENOUS | Status: AC
  Administered 2023-06-24: 69 mg via INTRAVENOUS
  Filled 2023-06-24: qty 6.9

## 2023-06-24 MED ORDER — DOSTARLIMAB-GXLY CHEMO 500 MG/10ML IV SOLN
500.0000 mg | Freq: Once | INTRAVENOUS | Status: AC
  Administered 2023-06-24: 500 mg via INTRAVENOUS
  Filled 2023-06-24: qty 10

## 2023-06-24 MED ORDER — SODIUM CHLORIDE 0.9 % IV SOLN: Freq: Once | INTRAVENOUS | Status: AC

## 2023-06-24 MED ORDER — OXYCODONE HCL 5 MG PO TABS
5.0000 mg | ORAL_TABLET | Freq: Once | ORAL | Status: AC
  Administered 2023-06-24: 5 mg via ORAL
  Filled 2023-06-24: qty 1

## 2023-06-24 MED ORDER — DEXAMETHASONE SODIUM PHOSPHATE 10 MG/ML IJ SOLN
10.0000 mg | Freq: Once | INTRAMUSCULAR | Status: AC
  Administered 2023-06-24: 10 mg via INTRAVENOUS
  Filled 2023-06-24: qty 1

## 2023-06-24 MED ORDER — PALONOSETRON HCL INJECTION 0.25 MG/5ML
0.2500 mg | Freq: Once | INTRAVENOUS | Status: AC
  Administered 2023-06-24: 0.25 mg via INTRAVENOUS
  Filled 2023-06-24: qty 5

## 2023-06-24 MED ORDER — SODIUM CHLORIDE 0.9 % IV SOLN
150.0000 mg | Freq: Once | INTRAVENOUS | Status: AC
  Administered 2023-06-24: 150 mg via INTRAVENOUS
  Filled 2023-06-24: qty 150

## 2023-06-24 MED ORDER — FAMOTIDINE IN NACL 20-0.9 MG/50ML-% IV SOLN
20.0000 mg | Freq: Once | INTRAVENOUS | Status: AC
  Administered 2023-06-24: 20 mg via INTRAVENOUS
  Filled 2023-06-24: qty 50

## 2023-06-24 MED ORDER — HEPARIN SOD (PORK) LOCK FLUSH 100 UNIT/ML IV SOLN: 250.0000 [IU] | INTRAVENOUS | Status: DC | PRN

## 2023-06-24 MED ORDER — HEPARIN SOD (PORK) LOCK FLUSH 100 UNIT/ML IV SOLN
500.0000 [IU] | Freq: Once | INTRAVENOUS | Status: AC | PRN
  Administered 2023-06-24: 500 [IU]

## 2023-06-24 MED ORDER — SENNA 8.6 MG PO TABS: 2.0000 | ORAL_TABLET | Freq: Two times a day (BID) | ORAL | 0 refills | Status: DC

## 2023-06-24 MED ORDER — SODIUM CHLORIDE 0.9% FLUSH: 10.0000 mL | INTRAVENOUS | Status: DC | PRN

## 2023-06-24 MED ORDER — SODIUM CHLORIDE 0.9% FLUSH
10.0000 mL | INTRAVENOUS | Status: DC | PRN
Start: 2023-06-24 — End: 2023-06-24
  Administered 2023-06-24: 10 mL

## 2023-06-24 MED ORDER — ACETAMINOPHEN 325 MG PO TABS
650.0000 mg | ORAL_TABLET | Freq: Once | ORAL | Status: AC
  Administered 2023-06-24: 650 mg via ORAL
  Filled 2023-06-24: qty 2

## 2023-06-24 MED ORDER — SODIUM CHLORIDE 0.9% FLUSH
10.0000 mL | Freq: Once | INTRAVENOUS | Status: AC
  Administered 2023-06-24: 10 mL

## 2023-06-24 MED ORDER — SODIUM CHLORIDE 0.9% IV SOLUTION
250.0000 mL | INTRAVENOUS | Status: DC
Start: 2023-06-24 — End: 2023-06-24
  Administered 2023-06-24: 100 mL via INTRAVENOUS

## 2023-06-24 MED ORDER — OXYCODONE HCL 5 MG PO TABS: 5.0000 mg | ORAL_TABLET | Freq: Four times a day (QID) | ORAL | 0 refills | Status: DC | PRN

## 2023-06-24 NOTE — Patient Instructions (Addendum)
 [facility] - A DEPT OF [facility]  Discharge Instructions: Thank you for choosing [facility] to provide your oncology and hematology care.   If you have a lab appointment with the Cancer Center, please go directly to the Cancer Center and check in at the registration area.   Wear comfortable clothing and clothing appropriate for easy access to any Portacath or PICC line.   We strive to give you quality time with your provider. You may need to reschedule your appointment if you arrive late (15 or more minutes).  Arriving late affects you and other patients whose appointments are after yours.  Also, if you miss three or more appointments without notifying the office, you may be dismissed from the clinic at the provider's discretion.      For prescription refill requests, have your pharmacy contact our office and allow 72 hours for refills to be completed.    Today you received the following chemotherapy and/or immunotherapy agents: Dostarlimib (Jempreli), Docetaxel  (Taxotere ), and Carboplatin .      To help prevent nausea and vomiting after your treatment, we encourage you to take your nausea medication as directed.  BELOW ARE SYMPTOMS THAT SHOULD BE REPORTED IMMEDIATELY: *FEVER GREATER THAN 100.4 F (38 C) OR HIGHER *CHILLS OR SWEATING *NAUSEA AND VOMITING THAT IS NOT CONTROLLED WITH YOUR NAUSEA MEDICATION *UNUSUAL SHORTNESS OF BREATH *UNUSUAL BRUISING OR BLEEDING *URINARY PROBLEMS (pain or burning when urinating, or frequent urination) *BOWEL PROBLEMS (unusual diarrhea, constipation, pain near the anus) TENDERNESS IN MOUTH AND THROAT WITH OR WITHOUT PRESENCE OF ULCERS (sore throat, sores in mouth, or a toothache) UNUSUAL RASH, SWELLING OR PAIN  UNUSUAL VAGINAL DISCHARGE OR ITCHING   Items with * indicate a potential emergency and should be followed up as soon as possible or go to the Emergency Department if any problems should  occur.  Please show the CHEMOTHERAPY ALERT CARD or IMMUNOTHERAPY ALERT CARD at check-in to the Emergency Department and triage nurse.  Should you have questions after your visit or need to cancel or reschedule your appointment, please contact [facility] - A DEPT OF [facility]  Dept: [phone]  and follow the prompts.  Office hours are 8:00 a.m. to 4:30 p.m. Monday - Friday. Please note that voicemails left after 4:00 p.m. may not be returned until the following business day.  We are closed weekends and major holidays. You have access to a nurse at all times for urgent questions. Please call the main number to the clinic Dept: [phone] and follow the prompts.   For any non-urgent questions, you may also contact your provider using MyChart. We now offer e-Visits for anyone 4 and older to request care online for non-urgent symptoms. For details visit [email or web address].   Also download the MyChart app! Go to the app store, search "MyChart", open the app, select [facility], and log in with your MyChart username and password.  Blood Transfusion, Adult, Care After After a blood transfusion, it is common to have: Bruising and soreness at the IV site. A headache. Follow these instructions at home: Your doctor may give you more instructions. If you have problems, contact your doctor. Insertion site care     Follow instructions from your doctor about how to take care of your insertion site. This is where an IV tube was put into your vein. Make sure you: Wash your hands with soap and water  for at least  20 seconds before and after you change your bandage. If you cannot use soap and water , use hand sanitizer. Change your bandage as told by your doctor. Check your insertion site every day for signs of infection. Check for: Redness, swelling, or pain. Bleeding from the site. Warmth. Pus or a bad smell. General instructions Take over-the-counter and  prescription medicines only as told by your doctor. Rest as told by your doctor. Go back to your normal activities as told by your doctor. Keep all follow-up visits. You may need to have tests at certain times to check your blood. Contact a doctor if: You have itching or red, swollen areas of skin (hives). You have a fever or chills. You have pain in the head, back, or chest. You feel worried or nervous (anxious). You feel weak after doing your normal activities. You have any of these problems at the insertion site: Redness, swelling, warmth, or pain. Bleeding that does not stop with pressure. Pus or a bad smell. If you received your blood transfusion in an outpatient setting, you will be told whom to contact to report any reactions. Get help right away if: You have signs of a serious reaction. This may be coming from an allergy or the body's defense system (immune system). Signs include: Trouble breathing or shortness of breath. Swelling of the face or feeling warm (flushed). A widespread rash. Dark pee (urine) or blood in the pee. Fast heartbeat. These symptoms may be an emergency. Get help right away. Call 911. Do not wait to see if the symptoms will go away. Do not drive yourself to the hospital. Summary Bruising and soreness at the IV site are common. Check your insertion site every day for signs of infection. Rest as told by your doctor. Go back to your normal activities as told by your doctor. Get help right away if you have signs of a serious reaction. This information is not intended to replace advice given to you by your health care provider. Make sure you discuss any questions you have with your health care provider. Document Revised: 04/17/2021 Document Reviewed: 04/17/2021 Elsevier Patient Education  2024 Elsevier Inc. Oxycodone ; Acetaminophen  Solution What is this medication? ACETAMINOPHEN ; OXYCODONE  (a set a MEE noe fen; ox i KOE done) treats moderate pain. It is  prescribed when other pain medications have not worked or cannot be tolerated. It works by blocking pain signals in the brain. This medication is a combination of acetaminophen  and an opioid. This medicine may be used for other purposes; ask your health care provider or pharmacist if you have questions. COMMON BRAND NAME(S): Prolate, Roxicet What should I tell my care team before I take this medication? They need to know if you have any of these conditions: Brain tumor Frequently drink alcohol Head injury Heart disease Kidney disease Liver disease Low adrenal gland function Lung or breathing disease, such as asthma or COPD Seizures Stomach or intestine problems Substance use disorder Taken an MAOI, such as Marplan, Nardil, or Parnate in the last 14 days An unusual or allergic reaction to acetaminophen , oxycodone , other medications, foods, dyes, or preservative Pregnant or trying to get pregnant Breastfeeding How should I use this medication? Take this medication by mouth with a full glass of water . Take it as directed on the label. Use a specially marked oral syringe, spoon, or dropper to measure each dose. Ask your pharmacist if you do not have one. Household spoons are not accurate. You can take it with or without food. If  it upsets your stomach, take it with food. Do not use it more often than directed. There may be unused or extra doses in the bottle after you finish your treatment. Talk to your care team if you have questions about your dose. A special MedGuide will be given to you by the pharmacist with each prescription and refill. Be sure to read this information carefully each time. Talk to your care team about the use of this medication in children. Special care may be needed. People 65 years and older may have a stronger reaction and need a smaller dose. Overdosage: If you think you have taken too much of this medicine contact a poison control center or emergency room at  once. NOTE: This medicine is only for you. Do not share this medicine with others. What if I miss a dose? This does not apply. This medication is not for regular use. It should only be used as needed. What may interact with this medication? Alcohol Antihistamines for allergy, cough, and cold Antiviral medications for HIV or AIDS Atropine Certain antibiotics, such as clarithromycin, erythromycin, linezolid, rifampin Certain medications for anxiety or sleep Certain medications for bladder problems, such as oxybutynin, tolterodine Certain medications for depression, such as amitriptyline, fluoxetine, sertraline Certain medications for fungal infections, such as ketoconazole, itraconazole, voriconazole Certain medications for migraine headache, such as almotriptan, eletriptan, frovatriptan, naratriptan, rizatriptan, sumatriptan, zolmitriptan Certain medications for nausea or vomiting, such as dolasetron, ondansetron , palonosetron  Certain medications for Parkinson disease, such as benztropine, trihexyphenidyl Certain medications for seizures, such as phenobarbital, phenytoin, primidone Certain medications for stomach problems, such as dicyclomine, hyoscyamine Certain medications for travel sickness, such as scopolamine  Diuretics General anesthetics, such as halothane, isoflurane, methoxyflurane, propofol  Ipratropium Local anesthetics, such as lidocaine , pramoxine, tetracaine MAOIs, such as Carbex, Eldepryl, Marplan, Nardil, Parnate Medications that relax muscles for surgery Methylene blue Nilotinib Other medications with acetaminophen  Other opioid medications for pain or cough Phenothiazines, such as chlorpromazine, mesoridazine, prochlorperazine , thioridazine This list may not describe all possible interactions. Give your health care provider a list of all the medicines, herbs, non-prescription drugs, or dietary supplements you use. Also tell them if you smoke, drink alcohol, or use  illegal drugs. Some items may interact with your medicine. What should I watch for while using this medication? Tell your care team if your pain does not go away, if it gets worse, or if you have new or a different type of pain. You may develop tolerance to this medication. Tolerance means that you will need a higher dose of the medication for pain relief. Tolerance is normal and is expected if you take this medication for a long time. Taking this medication with other substances that cause drowsiness, such as alcohol, benzodiazepines, or other opioids can cause serious side effects. Give your care team a list of all medications you use. They will tell you how much medication to take. Do not take more medication than directed. Call emergency services if you have problems breathing or staying awake. Children may be at higher risk for side effects. Stop giving this medication and call emergency services right away if your child has slow or noisy breathing, has confusion, is unusually sleepy, or not able to wake up. Long term use of this medication may cause your brain and body to depend on it. This can happen even when used as directed by your care team. You and your care team will work together to determine how long you will need to take this medication. If  your care team wants you to stop this medication, the dose will be slowly lowered over time to reduce the risk of side effects. Naloxone is an emergency medication used for an opioid overdose. An overdose can happen if you take too much of an opioid. It can also happen if an opioid is taken with some other medications or substances such as alcohol. Know the symptoms of an overdose, such as trouble breathing, unusually tired or sleepy, or not being able to respond or wake up. Make sure to tell caregivers and close contacts where your naloxone is stored. Make sure they know how to use it. After naloxone is given, the person giving it must call emergency  services. Naloxone is a temporary treatment. Repeat doses may be needed. This medication may affect your coordination, reaction time, or judgment. Do not drive or operate machinery until you know how this medication affects you. Sit up or stand slowly to reduce the risk of dizzy or fainting spells. Drinking alcohol with this medication can increase the risk of these side effects. Do not take other medications that contain acetaminophen  with this medication. Many non-prescription medications contain acetaminophen . Always read labels carefully. If you have questions, ask your care team. If you take too much acetaminophen , get medical help right away. Too much acetaminophen  can be very dangerous and cause liver damage. Even if you do not have symptoms, it is important to get help right away. This medication will cause constipation. If you do not have a bowel movement for 3 days, call your care team. Your mouth may get dry. Chewing sugarless gum or sucking hard candy and drinking plenty of water  may help. Contact your care team if the problem does not go away or is severe. Talk to your care team if you may be pregnant. Prolonged use of this medication during pregnancy can cause temporary withdrawal in a newborn. Talk to your care team before breastfeeding. Changes to your treatment plan may be needed. If you breastfeed while taking this medication, seek medical care right away if you notice the child has slow or noisy breathing, is unusually sleepy or not able to wake up, or is limp. Long-term use of this medication may cause infertility. Talk to your care team if you are concerned about your fertility. What side effects may I notice from receiving this medication? Side effects that you should report to your care team as soon as possible: Allergic reactions--skin rash, itching, hives, swelling of the face, lips, tongue, or throat CNS depression--slow or shallow breathing, shortness of breath, feeling faint,  dizziness, confusion, trouble staying awake Liver injury--right upper belly pain, loss of appetite, nausea, light-colored stool, dark yellow or brown urine, yellowing skin or eyes, unusual weakness or fatigue Low adrenal gland function--nausea, vomiting, loss of appetite, unusual weakness or fatigue, dizziness Low blood pressure--dizziness, feeling faint or lightheaded, blurry vision Redness, blistering, peeling, or loosening of the skin, including inside the mouth Side effects that usually do not require medical attention (report to your care team if they continue or are bothersome): Constipation Dizziness Drowsiness Dry mouth Headache Nausea Trouble sleeping Upset stomach Vomiting This list may not describe all possible side effects. Call your doctor for medical advice about side effects. You may report side effects to FDA at 1-800-FDA-1088. Where should I keep my medication? Keep out of the reach of children and pets. This medication can be abused. Keep it in a safe place to protect it from theft. Do not share it with anyone.  It is only for you. Selling or giving away this medication is dangerous and against the law. Store at room temperature between 20 and 25 degrees C (68 and 77 degrees F). Protect from light and moisture. Keep the container tightly closed. This medication may cause harm and death if it is taken by other adults, children, or pets. It is important to get rid of the medication as soon as you no longer need it, or it is expired. You can do this in two ways: Take the medication to a medication take-back program. Check with your pharmacy or law enforcement to find a location. If you cannot return the medication, flush it down the toilet. NOTE: This sheet is a summary. It may not cover all possible information. If you have questions about this medicine, talk to your doctor, pharmacist, or health care provider.  2024 Elsevier/Gold Standard (2022-02-17 00:00:00)

## 2023-06-24 NOTE — Assessment & Plan Note (Addendum)
 She has recent profound constipation On reviewing her medication list, she has not been taking her laxatives correctly I have put it in writing to ensure she is taking MiraLAX daily, Senokot twice daily, lactulose  3 times a day and we will call her again next week to follow-up on her progress

## 2023-06-24 NOTE — Assessment & Plan Note (Addendum)
 Could be due to risk factor/diabetes I will adjust the dose of carboplatin  today

## 2023-06-24 NOTE — Progress Notes (Signed)
 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 P53 mixed/WT, MMRp CARIS: MSI stable. PIK3CA and p53 mutant, ER neg, Her2/neu 0%  Cycle 1 of chemotherapy was complicated by allergic reaction to paclitaxel  For cycle 2 of treatment, the plan will be to switch out paclitaxel  and substitute with docetaxel  and she received transfusion support All the last few cycles of treatment was complicated by severe anemia requiring blood transfusion in addition to chemotherapy and dose reduction She is miserable today and has lost weight Her appetite is poor due to severe constipation The patient wants to complete treatment today Due to severe anemia and changes in her weight and creatinine, we will proceed with chemo with blood transfusion support and dose reduction I plan to order imaging study in 2 weeks to rule out cancer reoccurrence or progression of lung nodules before we switch over to maintenance immunotherapy and for her to start radiation treatment Microcytic anemia We discussed some of the risks, benefits, and alternatives of blood transfusions. The patient is symptomatic from anemia and the hemoglobin level is critically low.  Some of the side-effects to be expected including risks of transfusion reactions, chills, infection, syndrome of volume overload and risk of hospitalization from various reasons and the patient is  willing to proceed and went ahead to sign consent today. She will receive 1 unit of blood in addition to treatment Elevated serum creatinine Could be due to risk factor/diabetes I will adjust the dose of carboplatin  today Other constipation She has recent profound constipation On reviewing her medication list, she has not been taking her laxatives correctly I have put it in writing to ensure she is taking MiraLAX daily, Senokot twice daily, lactulose  3 times a day and we will call her again next week to follow-up on her progress  Orders Placed This Encounter  Procedures   CT CHEST ABDOMEN PELVIS W CONTRAST    Standing Status:   Future    Expected Date:   07/12/2023    Expiration Date:   06/23/2024    If indicated for the ordered procedure, I authorize the administration of contrast media per Radiology protocol:   Yes    Does the patient have a contrast media/X-ray dye allergy?:   No    Preferred imaging location?:   [facility]    If indicated for the ordered procedure, I authorize the administration of oral contrast media per Radiology protocol:   No    Reason for no oral contrast::   no need oral contrast   CMP (Cancer Center only)    Standing Status:   Future    Expiration Date:   06/23/2024   CBC with Differential (Cancer Center Only)    Standing Status:   Future    Expiration Date:   06/23/2024   Informed Consent Details: Physician/Practitioner Attestation; Transcribe to consent form and obtain patient signature    Standing Status:   Future    Number of Occurrences:   1  Expected Date:   06/24/2023    Expiration Date:   06/23/2024    Physician/Practitioner attestation of informed consent for blood and or blood product transfusion:   I, the physician/practitioner, attest that I have discussed with the patient the benefits, risks, side effects, alternatives, likelihood of achieving goals and potential problems during recovery for the procedure that I have provided informed  consent.    Product(s):   All Product(s)   Care order/instruction    Transfuse Parameters    Standing Status:   Future    Number of Occurrences:   1    Expiration Date:   06/23/2024   Type and screen         Standing Status:   Future    Number of Occurrences:   1    Expiration Date:   06/23/2024   Prepare RBC (crossmatch)    Standing Status:   Standing    Number of Occurrences:   1    # of Units:   1 unit    Transfusion Indications:   Hemoglobin 8 gm/dL or less and orthopedic or cardiac surgery or pre-existing cardiac condition    Number of Units to Keep Ahead:   NO units ahead    Instructions::   Transfuse    If emergent release call blood bank:   Not emergent release   Sample to Blood Bank    Standing Status:   Standing    Number of Occurrences:   33    Expiration Date:   06/23/2024     Danielle Jacobs, MD  INTERVAL HISTORY: she returns for treatment follow-up Complications related to previous cycle of chemotherapy included anemia,, constipation,, weight loss,, peripheral neuropathy,, and changes in renal function She is miserable due to constipation She has not been taking her laxatives correctly Denies nausea She has lost weight due to poor appetite She noticed worsening peripheral neuropathy  PHYSICAL EXAMINATION: ECOG PERFORMANCE STATUS: 1 - Symptomatic but completely ambulatory  No results found for: "CAN125"    Latest Ref Rng & Units 06/24/2023    9:57 AM 06/03/2023    9:45 AM 05/13/2023    9:33 AM  CBC  WBC 4.0 - 10.5 K/uL 5.2  4.3  5.0   Hemoglobin 12.0 - 15.0 g/dL 7.7  8.0  8.0   Hematocrit 36.0 - 46.0 % 22.9  24.0  24.8   Platelets 150 - 400 K/uL 280  200  232       Chemistry      Component Value Date/Time   NA 137 06/24/2023 0957   K 4.2 06/24/2023 0957   CL 100 06/24/2023 0957   CO2 26 06/24/2023 0957   BUN 17 06/24/2023 0957   CREATININE 1.39 (H) 06/24/2023 0957      Component Value Date/Time   CALCIUM 9.0 06/24/2023 0957   ALKPHOS 54 06/24/2023  0957   AST 14 (L) 06/24/2023 0957   ALT 6 06/24/2023 0957   BILITOT 0.4 06/24/2023 0957       Vitals:   06/24/23 1009  BP: 137/69  Pulse: 90  Resp: 18  Temp: 98.4 F (36.9 C)  SpO2: 100%   Filed Weights   06/24/23 1009  Weight: 176 lb (79.8 kg)   Other relevant data reviewed during this visit included CBC and CMP

## 2023-06-24 NOTE — Progress Notes (Signed)
 Per Dr. Marton Sleeper ok to run blood transfusion at 300 cc/hr.

## 2023-06-24 NOTE — Progress Notes (Signed)
 Ok to reduce carboplatin  to 270mg  based on today's SCr per Dr Marton Sleeper

## 2023-06-24 NOTE — Assessment & Plan Note (Addendum)
 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 P53 mixed/WT, MMRp CARIS: MSI stable. PIK3CA and p53 mutant, ER neg, Her2/neu 0%  Cycle 1 of chemotherapy was complicated by allergic reaction to paclitaxel  For cycle 2 of treatment, the plan will be to switch out paclitaxel  and substitute with docetaxel  and she received transfusion support All the last few cycles of treatment was complicated by severe anemia requiring blood transfusion in addition to chemotherapy and dose reduction She is miserable today and has lost weight Her appetite is poor due to severe constipation The patient wants to complete treatment today Due to severe anemia and changes in her weight and creatinine, we will proceed with chemo with blood transfusion support and dose reduction I plan to order imaging study in 2 weeks to rule out cancer reoccurrence or progression of lung nodules before we switch over to maintenance immunotherapy and for her to start radiation treatment

## 2023-06-24 NOTE — Assessment & Plan Note (Addendum)
 We discussed some of the risks, benefits, and alternatives of blood transfusions. The patient is symptomatic from anemia and the hemoglobin level is critically low.  Some of the side-effects to be expected including risks of transfusion reactions, chills, infection, syndrome of volume overload and risk of hospitalization from various reasons and the patient is willing to proceed and went ahead to sign consent today. She will receive 1 unit of blood in addition to treatment

## 2023-06-25 LAB — T4: T4, Total: 9 ug/dL (ref 4.5–12.0)

## 2023-06-27 LAB — TYPE AND SCREEN
ABO/RH(D): O POS
Antibody Screen: NEGATIVE
Unit division: 0

## 2023-06-27 LAB — BPAM RBC
Blood Product Expiration Date: 202506252359
ISSUE DATE / TIME: 202505231456
Unit Type and Rh: 5100

## 2023-06-28 ENCOUNTER — Telehealth: Payer: Self-pay

## 2023-06-28 NOTE — Telephone Encounter (Signed)
 I recommend the patient to call back radiology and see if she can get CT done end of this week Once confirmed new CT time, let me know and I will see her sooner

## 2023-06-28 NOTE — Telephone Encounter (Signed)
 Called to check on constipation. Last BM today, constipation is better. She will continue laxatives, reviewed laxatives.  She started having vagina bleeding on Saturday. She is changing her maxi pad every 1-2 hours while awake. The blood is bright red. She is feeling weak and c/o no energy. She has changed her maxi pad x 2 today so far. She states, " it is a lot of blood". She has had vaginal bleeding on and off with treatment but not like this.

## 2023-06-28 NOTE — Telephone Encounter (Signed)
 Called her back and given below message. She is back at work today and does not want to move CT up to this week. She will leave CT as scheduled on 6/10. Offered her a note for work and she declined work note, she is concerned about missing work. She thinks that the bleeding is worse due to the recent blood transfusion. Instructed to call the office back for worsening symptoms or go the ER. She verbalized understanding.

## 2023-06-28 NOTE — Telephone Encounter (Signed)
 I tried to explain to her blood transfusion does not cause bleeding She has also declined seeing Dr. Orvil Bland recently

## 2023-06-30 ENCOUNTER — Ambulatory Visit: Admitting: Gynecologic Oncology

## 2023-07-12 ENCOUNTER — Other Ambulatory Visit: Payer: Self-pay | Admitting: Hematology and Oncology

## 2023-07-12 ENCOUNTER — Inpatient Hospital Stay

## 2023-07-12 ENCOUNTER — Encounter: Payer: Self-pay | Admitting: Hematology and Oncology

## 2023-07-12 ENCOUNTER — Telehealth: Payer: Self-pay

## 2023-07-12 ENCOUNTER — Other Ambulatory Visit: Payer: Self-pay

## 2023-07-12 ENCOUNTER — Ambulatory Visit (HOSPITAL_COMMUNITY)
Admission: RE | Admit: 2023-07-12 | Discharge: 2023-07-12 | Disposition: A | Discharge status: Home / Self Care | Source: Ambulatory Visit | Attending: Hematology and Oncology | Admitting: Hematology and Oncology

## 2023-07-12 ENCOUNTER — Inpatient Hospital Stay: Attending: Gynecologic Oncology

## 2023-07-12 ENCOUNTER — Encounter (HOSPITAL_COMMUNITY): Payer: Self-pay

## 2023-07-12 ENCOUNTER — Inpatient Hospital Stay (HOSPITAL_BASED_OUTPATIENT_CLINIC_OR_DEPARTMENT_OTHER): Admitting: Hematology and Oncology

## 2023-07-12 VITALS — BP 102/67 | HR 81 | Temp 98.1°F | Resp 18 | Ht 66.0 in | Wt 170.0 lb

## 2023-07-12 VITALS — BP 105/30 | HR 83

## 2023-07-12 DIAGNOSIS — Z79620 Long term (current) use of immunosuppressive biologic: Secondary | ICD-10-CM | POA: Diagnosis not present

## 2023-07-12 DIAGNOSIS — Z79899 Other long term (current) drug therapy: Secondary | ICD-10-CM | POA: Insufficient documentation

## 2023-07-12 DIAGNOSIS — D61818 Other pancytopenia: Secondary | ICD-10-CM

## 2023-07-12 DIAGNOSIS — N179 Acute kidney failure, unspecified: Secondary | ICD-10-CM | POA: Diagnosis not present

## 2023-07-12 DIAGNOSIS — C541 Malignant neoplasm of endometrium: Secondary | ICD-10-CM | POA: Diagnosis present

## 2023-07-12 DIAGNOSIS — M549 Dorsalgia, unspecified: Secondary | ICD-10-CM | POA: Diagnosis not present

## 2023-07-12 DIAGNOSIS — Z791 Long term (current) use of non-steroidal anti-inflammatories (NSAID): Secondary | ICD-10-CM | POA: Insufficient documentation

## 2023-07-12 DIAGNOSIS — E860 Dehydration: Secondary | ICD-10-CM | POA: Insufficient documentation

## 2023-07-12 DIAGNOSIS — K5900 Constipation, unspecified: Secondary | ICD-10-CM | POA: Diagnosis not present

## 2023-07-12 LAB — CBC WITH DIFFERENTIAL (CANCER CENTER ONLY)
Abs Immature Granulocytes: 0.02 10*3/uL (ref 0.00–0.07)
Basophils Absolute: 0 10*3/uL (ref 0.0–0.1)
Basophils Relative: 0 %
Eosinophils Absolute: 0 10*3/uL (ref 0.0–0.5)
Eosinophils Relative: 0 %
HCT: 20.5 % — ABNORMAL LOW (ref 36.0–46.0)
Hemoglobin: 6.9 g/dL — CL (ref 12.0–15.0)
Immature Granulocytes: 0 %
Lymphocytes Relative: 11 %
Lymphs Abs: 0.8 10*3/uL (ref 0.7–4.0)
MCH: 26.5 pg (ref 26.0–34.0)
MCHC: 33.7 g/dL (ref 30.0–36.0)
MCV: 78.8 fL — ABNORMAL LOW (ref 80.0–100.0)
Monocytes Absolute: 0.8 10*3/uL (ref 0.1–1.0)
Monocytes Relative: 12 %
Neutro Abs: 5.3 10*3/uL (ref 1.7–7.7)
Neutrophils Relative %: 77 %
Platelet Count: 277 10*3/uL (ref 150–400)
RBC: 2.6 MIL/uL — ABNORMAL LOW (ref 3.87–5.11)
RDW: 15.6 % — ABNORMAL HIGH (ref 11.5–15.5)
WBC Count: 6.9 10*3/uL (ref 4.0–10.5)
nRBC: 0 % (ref 0.0–0.2)

## 2023-07-12 LAB — CMP (CANCER CENTER ONLY)
ALT: 5 U/L (ref 0–44)
AST: 12 U/L — ABNORMAL LOW (ref 15–41)
Albumin: 3.3 g/dL — ABNORMAL LOW (ref 3.5–5.0)
Alkaline Phosphatase: 58 U/L (ref 38–126)
Anion gap: 11 (ref 5–15)
BUN: 52 mg/dL — ABNORMAL HIGH (ref 8–23)
CO2: 20 mmol/L — ABNORMAL LOW (ref 22–32)
Calcium: 8.3 mg/dL — ABNORMAL LOW (ref 8.9–10.3)
Chloride: 106 mmol/L (ref 98–111)
Creatinine: 3.7 mg/dL — ABNORMAL HIGH (ref 0.44–1.00)
GFR, Estimated: 13 mL/min — ABNORMAL LOW (ref >60)
Glucose, Bld: 119 mg/dL — ABNORMAL HIGH (ref 70–99)
Potassium: 3.4 mmol/L — ABNORMAL LOW (ref 3.5–5.1)
Sodium: 137 mmol/L (ref 135–145)
Total Bilirubin: 0.3 mg/dL (ref 0.0–1.2)
Total Protein: 7 g/dL (ref 6.5–8.1)

## 2023-07-12 LAB — TSH: TSH: 2.42 u[IU]/mL (ref 0.350–4.500)

## 2023-07-12 LAB — PREPARE RBC (CROSSMATCH)

## 2023-07-12 MED ORDER — SODIUM CHLORIDE 0.9 % IV SOLN: Freq: Once | INTRAVENOUS | Status: AC

## 2023-07-12 MED ORDER — SODIUM CHLORIDE 0.9% FLUSH
10.0000 mL | Freq: Once | INTRAVENOUS | Status: AC
  Administered 2023-07-12: 10 mL

## 2023-07-12 NOTE — Assessment & Plan Note (Addendum)
 She developed severe acute renal failure due to dehydration, poor oral intake and ibuprofen use I recommend the patient to stop taking ibuprofen We will provide aggressive IV fluid resuscitation today and tomorrow she will start aggressive oral fluid intake Will repeat her labs on Thursday

## 2023-07-12 NOTE — Assessment & Plan Note (Addendum)
 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 P53 mixed/WT, MMRp CARIS: MSI stable. PIK3CA and p53 mutant, ER neg, Her2/neu 0%  Cycle 1 of chemotherapy was complicated by allergic reaction to paclitaxel  For cycle 2 of treatment, the plan will be to switch out paclitaxel  and substitute with docetaxel  and she received transfusion support All the last few cycles of treatment was complicated by severe anemia requiring blood transfusion in addition to chemotherapy and dose reduction She is miserable today and has lost weight Her appetite is poor due to severe constipation Recently, due to back pain, the patient started taking ibuprofen She came here today to get labs and CT imaging Labs came back grossly abnormal and CT imaging is canceled I reviewed labs with the patient today and explained to her the rationale behind aggressive IV fluid resuscitation and cancelling her imaging study and she agreed

## 2023-07-12 NOTE — Progress Notes (Unsigned)
 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 P53 mixed/WT, MMRp CARIS: MSI stable. PIK3CA and p53 mutant, ER neg, Her2/neu 0%  Cycle 1 of chemotherapy was complicated by allergic reaction to paclitaxel  For cycle 2 of treatment, the plan will be to switch out paclitaxel  and substitute with docetaxel  and she received transfusion support All the last few cycles of treatment was complicated by severe anemia requiring blood transfusion in addition to chemotherapy and dose reduction She is miserable today and has lost weight Her appetite is poor due to severe constipation Recently, due to back pain, the patient started taking ibuprofen She came here today to get labs and CT imaging Labs came back grossly abnormal and CT imaging is canceled I reviewed labs with the patient today and explained to her the rationale behind aggressive IV fluid resuscitation and cancelling her imaging study and she agreed Acute renal failure, unspecified acute renal failure type (HCC) She developed severe acute renal failure due to dehydration, poor oral intake and ibuprofen use I recommend the patient to stop taking ibuprofen We will provide aggressive IV fluid resuscitation today and tomorrow she will start aggressive oral fluid intake Will repeat her labs on Thursday Pancytopenia, acquired [facility]) She developed severe anemia due to  treatment She is scheduled for blood transfusion on Thursday Back pain, unspecified back location, unspecified back pain laterality, unspecified chronicity She has intermittent chronic back pain I told the patient to stop taking NSAID She has oxycodone  to take as needed and I recommend the patient to take oxycodone  as needed for now  Orders Placed This Encounter  Procedures   CMP (Cancer Center only)    Standing Status:   Future    Expected Date:   07/14/2023    Expiration Date:   07/11/2024     Danielle Jacobs, MD  INTERVAL HISTORY: she returns for urgent evaluation She returns today for blood work as well as CT imaging I was informed of abnormal blood work CT imaging was delayed The patient is brought back to my office for evaluation She has lost weight She stated she has very poor oral intake since her last dose of treatment She drinks approximately 30 to 40 ounces of water  In the past week, she has taken a lot of ibuprofen for back pain The patient denies any recent signs or symptoms of bleeding such as spontaneous epistaxis, hematuria or hematochezia.   PHYSICAL EXAMINATION: ECOG PERFORMANCE STATUS: 2 - Symptomatic, <50% confined to bed  Vitals:   07/12/23 1541  BP: 102/67  Pulse: 81  Resp: 18  Temp: 98.1 F (36.7 C)  SpO2: 100%   Filed Weights   07/12/23 1541  Weight: 170 lb (77.1 kg)    Relevant data reviewed during this visit included CBC and CMP

## 2023-07-12 NOTE — Assessment & Plan Note (Addendum)
 She has intermittent chronic back pain I told the patient to stop taking NSAID She has oxycodone  to take as needed and I recommend the patient to take oxycodone  as needed for now

## 2023-07-12 NOTE — Assessment & Plan Note (Addendum)
 She developed severe anemia due to treatment She is scheduled for blood transfusion on Thursday

## 2023-07-12 NOTE — Patient Instructions (Signed)

## 2023-07-12 NOTE — Telephone Encounter (Signed)
 Called and given hgb results and instructed to keep blood bracelet on for appt for 1 unit of blood on 6/12. She verbralzied understanding.

## 2023-07-13 ENCOUNTER — Telehealth: Payer: Self-pay | Admitting: *Deleted

## 2023-07-13 ENCOUNTER — Encounter: Payer: Self-pay | Admitting: Hematology and Oncology

## 2023-07-13 LAB — T4: T4, Total: 6.3 ug/dL (ref 4.5–12.0)

## 2023-07-13 NOTE — Telephone Encounter (Signed)
 Danielle Mullins called and said she's not going to be able to drink 64 ounces of water  today, so she will need fluids tomorrow.  She said Dr. Marton Sleeper told her to call the day before to let her know.  Message routed to MD and support staff

## 2023-07-14 ENCOUNTER — Inpatient Hospital Stay

## 2023-07-14 ENCOUNTER — Inpatient Hospital Stay (HOSPITAL_BASED_OUTPATIENT_CLINIC_OR_DEPARTMENT_OTHER): Admitting: Hematology and Oncology

## 2023-07-14 ENCOUNTER — Other Ambulatory Visit: Payer: Self-pay | Admitting: *Deleted

## 2023-07-14 ENCOUNTER — Encounter: Payer: Self-pay | Admitting: Hematology and Oncology

## 2023-07-14 ENCOUNTER — Other Ambulatory Visit: Payer: Self-pay | Admitting: Hematology and Oncology

## 2023-07-14 ENCOUNTER — Inpatient Hospital Stay: Admitting: Nutrition

## 2023-07-14 ENCOUNTER — Other Ambulatory Visit: Payer: Self-pay

## 2023-07-14 VITALS — BP 118/61 | HR 88 | Temp 98.6°F | Resp 16

## 2023-07-14 VITALS — BP 120/69 | HR 96 | Temp 97.7°F | Resp 18 | Ht 66.0 in | Wt 169.2 lb

## 2023-07-14 DIAGNOSIS — C541 Malignant neoplasm of endometrium: Secondary | ICD-10-CM

## 2023-07-14 DIAGNOSIS — N179 Acute kidney failure, unspecified: Secondary | ICD-10-CM

## 2023-07-14 DIAGNOSIS — K5909 Other constipation: Secondary | ICD-10-CM | POA: Diagnosis not present

## 2023-07-14 DIAGNOSIS — R109 Unspecified abdominal pain: Secondary | ICD-10-CM | POA: Diagnosis not present

## 2023-07-14 DIAGNOSIS — D61818 Other pancytopenia: Secondary | ICD-10-CM

## 2023-07-14 DIAGNOSIS — M549 Dorsalgia, unspecified: Secondary | ICD-10-CM

## 2023-07-14 LAB — CBC WITH DIFFERENTIAL (CANCER CENTER ONLY)
Abs Immature Granulocytes: 0.02 10*3/uL (ref 0.00–0.07)
Basophils Absolute: 0 10*3/uL (ref 0.0–0.1)
Basophils Relative: 0 %
Eosinophils Absolute: 0 10*3/uL (ref 0.0–0.5)
Eosinophils Relative: 0 %
HCT: 19.5 % — ABNORMAL LOW (ref 36.0–46.0)
Hemoglobin: 6.5 g/dL — CL (ref 12.0–15.0)
Immature Granulocytes: 0 %
Lymphocytes Relative: 7 %
Lymphs Abs: 0.6 10*3/uL — ABNORMAL LOW (ref 0.7–4.0)
MCH: 26.5 pg (ref 26.0–34.0)
MCHC: 33.3 g/dL (ref 30.0–36.0)
MCV: 79.6 fL — ABNORMAL LOW (ref 80.0–100.0)
Monocytes Absolute: 0.6 10*3/uL (ref 0.1–1.0)
Monocytes Relative: 8 %
Neutro Abs: 6.7 10*3/uL (ref 1.7–7.7)
Neutrophils Relative %: 85 %
Platelet Count: 214 10*3/uL (ref 150–400)
RBC: 2.45 MIL/uL — ABNORMAL LOW (ref 3.87–5.11)
RDW: 15.9 % — ABNORMAL HIGH (ref 11.5–15.5)
WBC Count: 7.9 10*3/uL (ref 4.0–10.5)
nRBC: 0 % (ref 0.0–0.2)

## 2023-07-14 LAB — CMP (CANCER CENTER ONLY)
ALT: 7 U/L (ref 0–44)
AST: 13 U/L — ABNORMAL LOW (ref 15–41)
Albumin: 2.8 g/dL — ABNORMAL LOW (ref 3.5–5.0)
Alkaline Phosphatase: 57 U/L (ref 38–126)
Anion gap: 19 — ABNORMAL HIGH (ref 5–15)
BUN: 63 mg/dL — ABNORMAL HIGH (ref 8–23)
CO2: 15 mmol/L — ABNORMAL LOW (ref 22–32)
Calcium: 8.7 mg/dL — ABNORMAL LOW (ref 8.9–10.3)
Chloride: 106 mmol/L (ref 98–111)
Creatinine: 3.31 mg/dL — ABNORMAL HIGH (ref 0.44–1.00)
GFR, Estimated: 15 mL/min — ABNORMAL LOW (ref >60)
Glucose, Bld: 124 mg/dL — ABNORMAL HIGH (ref 70–99)
Potassium: 3.3 mmol/L — ABNORMAL LOW (ref 3.5–5.1)
Sodium: 140 mmol/L (ref 135–145)
Total Bilirubin: 1 mg/dL (ref 0.0–1.2)
Total Protein: 7 g/dL (ref 6.5–8.1)

## 2023-07-14 LAB — TYPE AND SCREEN
ABO/RH(D): O POS
Antibody Screen: NEGATIVE
Unit division: 0

## 2023-07-14 LAB — BPAM RBC
Blood Product Expiration Date: 202506292359
Unit Type and Rh: 5100

## 2023-07-14 LAB — SAMPLE TO BLOOD BANK

## 2023-07-14 MED ORDER — DEXAMETHASONE 4 MG PO TABS: 4.0000 mg | ORAL_TABLET | Freq: Every day | ORAL | 0 refills | Status: DC

## 2023-07-14 MED ORDER — DEXAMETHASONE SODIUM PHOSPHATE 10 MG/ML IJ SOLN
4.0000 mg | Freq: Once | INTRAMUSCULAR | Status: AC
  Administered 2023-07-14: 4 mg via INTRAVENOUS
  Filled 2023-07-14: qty 1

## 2023-07-14 MED ORDER — ACETAMINOPHEN 325 MG PO TABS
650.0000 mg | ORAL_TABLET | Freq: Once | ORAL | Status: AC
  Administered 2023-07-14: 650 mg via ORAL
  Filled 2023-07-14: qty 2

## 2023-07-14 MED ORDER — SODIUM CHLORIDE 0.9% IV SOLUTION
250.0000 mL | INTRAVENOUS | Status: DC
  Administered 2023-07-14: 100 mL via INTRAVENOUS

## 2023-07-14 MED ORDER — SODIUM CHLORIDE 0.9 % IV SOLN: Freq: Once | INTRAVENOUS | Status: AC

## 2023-07-14 MED ORDER — SODIUM CHLORIDE 0.9% FLUSH
10.0000 mL | Freq: Once | INTRAVENOUS | Status: AC
  Administered 2023-07-14: 10 mL

## 2023-07-14 NOTE — Progress Notes (Signed)
 Patient identified to be at risk for malnutrition on the MST secondary to weight loss and poor appetite.  63 year old female diagnosed with Uterine cancer S/p surgery and followed by Dr. Marton Sleeper. Patient is receiving blood transfusion and IVF today.  PMH includes DM and HTN.  Medications include Decadron , Lactulose , Glucophage , Zofran , Compazine , and Senokot.  Labs include HGB 6.5, potassium 3.3, Glucose 124, BUN 63 and Creatinine 3.31 on June 12.  Height: 5'6. Weight:  169 pounds 3.2 oz. June 12 176 pounds May 23. 189 pounds 11.2 oz April 11. 11% weight loss in less than 3 months/Clinically significant BMI: 27.31.  NFPE deferred. Patient does not feel well.  Patient noted to have poor appetite which she states is due to constipation. Developed ARF with dehydration and inadequate oral intake. She has taste alterations describing meat tastes salty and other food tastes bland. She brushes her teeth before eating but has not tried baking soda and salt water  gargle. She says she tolerates [facility] with hot sauce but very small amount.   She still reports constipation. Is not able to drink adequate fluids. Reports she vomited 4 times and it was yellow in color. Small amount of stool looked the same. Denies following MD bowel regimen: MiraLAX daily, Senokot twice daily, lactulose  3 times a day.  She has 2 drinks at table next to her chair and an open applesauce. She has a yogurt she brought from home, untouched. It does not look like she has consumed much of any of it. She agrees to try an [place] and an [employer]  Nutrition Diagnosis: Unintended weight loss related to inadequate oral intake as evidenced by 11% weight loss in less than 3 months.  Intervention: Educated on consuming small amounts of food/beverages frequently throughout the day. Educated on importance of increased fluids to improve constipation and minimize dehydration. Recommend oral nutrition supplements and  provided samples. Gave contact information. Reviewed high calorie, high protein foods in small amounts. Nutrition fact sheets provided. Samples given. Encouragement provided.  Monitoring, Evaluation, Goals: Tolerate increased calorie, protein and fluids to minimize further weight loss and dehydration and improve bowel function.  Next Visit: Friday, June 20, during infusion.

## 2023-07-14 NOTE — Assessment & Plan Note (Addendum)
 Her pain has improved a little bit with steroid treatment I recommend daily dexamethasone  She has oxycodone  to take as needed

## 2023-07-14 NOTE — Assessment & Plan Note (Addendum)
 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 P53 mixed/WT, MMRp CARIS: MSI stable. PIK3CA and p53 mutant, ER neg, Her2/neu 0%  Cycle 1 of chemotherapy was complicated by allergic reaction to paclitaxel  For cycle 2 of treatment, the plan will be to switch out paclitaxel  and substitute with docetaxel  and she received transfusion support All the last few cycles of treatment was complicated by severe anemia requiring blood transfusion in addition to chemotherapy and dose reduction When I saw her 2 days ago, she required IV fluid support She continues to have progressive weight loss due to poor oral intake, inability to hydrate and constipation She has not been taking laxatives as prescribed  Today, we will provide supportive care She will receive IV fluids, IV dexamethasone  and blood transfusion support She will return again tomorrow for IV fluids and I will see her again next week for further follow-up

## 2023-07-14 NOTE — Telephone Encounter (Signed)
 Danielle Mullins, any possibility of adding extra 1 hour for IVF? She is scheduled for blood

## 2023-07-14 NOTE — Assessment & Plan Note (Addendum)
 She has recent profound constipation On reviewing her medication list, she has not been taking her laxatives correctly Despite multiple education sessions recommending her to take MiraLAX twice daily and Senokot twice daily, she has not been taking it She was also prescribed lactulose  but she has not use it because she is worried it might make her blood sugar go high I reinforced importance of her taking her laxative consistently

## 2023-07-14 NOTE — Patient Instructions (Signed)
 Blood Transfusion, Adult, Care After After a blood transfusion, it is common to have: Bruising and soreness at the IV site. A headache. Follow these instructions at home: Your doctor may give you more instructions. If you have problems, contact your doctor. Insertion site care     Follow instructions from your doctor about how to take care of your insertion site. This is where an IV tube was put into your vein. Make sure you: Wash your hands with soap and water  for at least 20 seconds before and after you change your bandage. If you cannot use soap and water , use hand sanitizer. Change your bandage as told by your doctor. Check your insertion site every day for signs of infection. Check for: Redness, swelling, or pain. Bleeding from the site. Warmth. Pus or a bad smell. General instructions Take over-the-counter and prescription medicines only as told by your doctor. Rest as told by your doctor. Go back to your normal activities as told by your doctor. Keep all follow-up visits. You may need to have tests at certain times to check your blood. Contact a doctor if: You have itching or red, swollen areas of skin (hives). You have a fever or chills. You have pain in the head, back, or chest. You feel worried or nervous (anxious). You feel weak after doing your normal activities. You have any of these problems at the insertion site: Redness, swelling, warmth, or pain. Bleeding that does not stop with pressure. Pus or a bad smell. If you received your blood transfusion in an outpatient setting, you will be told whom to contact to report any reactions. Get help right away if: You have signs of a serious reaction. This may be coming from an allergy or the body's defense system (immune system). Signs include: Trouble breathing or shortness of breath. Swelling of the face or feeling warm (flushed). A widespread rash. Dark pee (urine) or blood in the pee. Fast heartbeat. These symptoms  may be an emergency. Get help right away. Call 911. Do not wait to see if the symptoms will go away. Do not drive yourself to the hospital. Summary Bruising and soreness at the IV site are common. Check your insertion site every day for signs of infection. Rest as told by your doctor. Go back to your normal activities as told by your doctor. Get help right away if you have signs of a serious reaction. This information is not intended to replace advice given to you by your health care provider. Make sure you discuss any questions you have with your health care provider. Dehydration, Adult Dehydration is a condition in which there is not enough water  or other fluids in the body. This happens when a person loses more fluids than they take in. Important organs cannot work right without the right amount of fluids. Any loss of fluids from the body can cause dehydration. Dehydration can be mild, worse, or very bad. It should be treated right away to keep it from getting very bad. What are the causes? Conditions that cause loss of water  in the body. They include: Watery poop (diarrhea). Vomiting. Sweating a lot. Fever. Infection. Peeing (urinating) a lot. Not drinking enough fluids. Certain medicines, such as medicines that take extra fluid out of the body (diuretics). Lack of safe drinking water . Not being able to get enough water  and food. What increases the risk? Having a long-term (chronic) illness that has not been treated the right way, such as: Diabetes. Heart disease. Kidney disease. Being  21 years of age or older. Having a disability. Living in a place that is high above the ground or sea (high in altitude). The thinner, drier air causes more fluid loss. Doing exercises that put stress on your body for a long time. Being active when in hot places. What are the signs or symptoms? Symptoms of dehydration depend on how bad it is. Mild or worse dehydration Thirst. Dry lips or dry  mouth. Feeling dizzy or light-headed. Muscle cramps. Passing little pee or dark pee. Pee may be the color of tea. Headache. Very bad dehydration Changes in skin. Skin may: Be cold to the touch (clammy). Be blotchy or pale. Not go back to normal right after you pinch it and let it go. Little or no tears, pee, or sweat. Fast breathing. Low blood pressure. Weak pulse. Pulse that is more than 100 beats a minute when you are sitting still. Other changes, such as: Feeling very thirsty. Eyes that look hollow (sunken). Cold hands and feet. Being confused. Being very tired (lethargic) or having trouble waking from sleep. Losing weight. Loss of consciousness. How is this treated? Treatment for this condition depends on how bad your dehydration is. Treatment should start right away. Do not wait until your condition gets very bad. Very bad dehydration is an emergency. You will need to go to a hospital. Mild or worse dehydration can be treated at home. You may be asked to: Drink more fluids. Drink an oral rehydration solution (ORS). This drink gives you the right amount of fluids, salts, and minerals (electrolytes). Very bad dehydration can be treated: With fluids through an IV tube. By correcting low levels of electrolytes in the body. By treating the problem that caused your dehydration. Follow these instructions at home: Oral rehydration solution If told by your doctor, drink an ORS: Make an ORS. Use instructions on the package. Start by drinking small amounts, about  cup (120 mL) every 5-10 minutes. Slowly drink more until you have had the amount that your doctor said to have.  Eating and drinking  Drink enough clear fluid to keep your pee pale yellow. If you were told to drink an ORS, finish the ORS first. Then, start slowly drinking other clear fluids. Drink fluids such as: Water . Do not drink only water . Doing that can make the salt (sodium) level in your body get too low. Water   from ice chips you suck on. Fruit juice that you have added water  to (diluted). Low-calorie sports drinks. Eat foods that have the right amounts of salts and minerals, such as bananas, oranges, potatoes, tomatoes, or spinach. Do not drink alcohol. Avoid drinks that have caffeine or sugar. These include:: High-calorie sports drinks. Fruit juice that you did not add water  to. Soda. Coffee or energy drinks. Avoid foods that are greasy or have a lot of fat or sugar. General instructions Take over-the-counter and prescription medicines only as told by your doctor. Do not take sodium tablets. Doing that can make the salt level in your body get too high. Return to your normal activities as told by your doctor. Ask your doctor what activities are safe for you. Keep all follow-up visits. Your doctor may check and change your treatment. Contact a doctor if: You have pain in your belly (abdomen) and the pain: Gets worse. Stays in one place. You have a rash. You have a stiff neck. You get angry or annoyed more easily than normal. You are more tired or have a harder time waking than  normal. You feel weak or dizzy. You feel very thirsty. Get help right away if: You have any symptoms of very bad dehydration. You vomit every time you eat or drink. Your vomiting gets worse, does not go away, or you vomit blood or green stuff. You are getting treatment, but symptoms are getting worse. You have a fever. You have a very bad headache. You have: Diarrhea that gets worse or does not go away. Blood in your poop (stool). This may cause poop to look black and tarry. No pee in 6-8 hours. Only a small amount of pee in 6-8 hours, and the pee is very dark. You have trouble breathing. These symptoms may be an emergency. Get help right away. Call 911. Do not wait to see if the symptoms will go away. Do not drive yourself to the hospital. This information is not intended to replace advice given to you by  your health care provider. Make sure you discuss any questions you have with your health care provider. Document Revised: 08/17/2021 Document Reviewed: 08/17/2021 Elsevier Patient Education  2024 [employer].

## 2023-07-14 NOTE — Progress Notes (Signed)
 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 P53 mixed/WT, MMRp CARIS: MSI stable. PIK3CA and p53 mutant, ER neg, Her2/neu 0%  Cycle 1 of chemotherapy was complicated by allergic reaction to paclitaxel  For cycle 2 of treatment, the plan will be to switch out paclitaxel  and substitute with docetaxel  and she received transfusion support All the last few cycles of treatment was complicated by severe anemia requiring blood transfusion in addition to chemotherapy and dose reduction When I saw her 2 days ago, she required IV fluid support She continues to have progressive weight loss due to poor oral intake, inability to hydrate and constipation She has not been taking laxatives as prescribed  Today, we will provide supportive care She will receive IV fluids, IV dexamethasone  and blood transfusion support She will return again tomorrow for IV fluids and I will see her again next week for further follow-up Pancytopenia, acquired (HCC) She developed severe anemia due to treatment She is scheduled for blood transfusion on today Plan to recheck her labs and give her more blood next week Acute renal failure, unspecified acute renal failure type (HCC) This has improved with IV fluids We will continue IV fluid support today and tomorrow and more IV fluid next week Other constipation She has recent profound  constipation On reviewing her medication list, she has not been taking her laxatives correctly Despite multiple education sessions recommending her to take MiraLAX twice daily and Senokot twice daily, she has not been taking it She was also prescribed lactulose  but she has not use it because she is worried it might make her blood sugar go high I reinforced importance of her taking her laxative consistently Back pain, unspecified back location, unspecified back pain laterality, unspecified chronicity Her pain has improved a little bit with steroid treatment I recommend daily dexamethasone  She has oxycodone  to take as needed  No orders of the defined types were placed in this encounter.    [name], MD  INTERVAL HISTORY: she returns for surveillance follow-up after recent findings of acute renal failure Today, the patient cut her blood bank bracelet away She has not been drinking water  She is constipated She has not been taking her laxatives She was prescribed oxycodone  for pain but she has not been taking it consistently She has been well today and has lost even more weight She has not been eating The patient denies any recent signs or symptoms of bleeding such as spontaneous epistaxis, hematuria or hematochezia.   PHYSICAL EXAMINATION: ECOG PERFORMANCE STATUS: 2 - Symptomatic, <50% confined to bed  Vitals:   07/14/23 1234  BP: 120/69  Pulse: 96  Resp: 18  Temp: 97.7 F (36.5 C)  SpO2: 100%   Filed Weights   07/14/23 1234  Weight: 169 lb 3.2 oz (76.7 kg)    Relevant data reviewed during this visit included CBC and CMP

## 2023-07-14 NOTE — Progress Notes (Signed)
 CRITICAL VALUE STICKER  CRITICAL VALUE: Hgb 6.5  RECEIVER (on-site recipient of call): Prentiss Brocks, RN  DATE & TIME NOTIFIED: 07/14/23 at 1235  MESSENGER (representative from lab): Temple Feeler   MD NOTIFIED: Dr. Marton Sleeper  TIME OF NOTIFICATION: 07/14/23 at 1238  RESPONSE: She will get 1 unit of blood today.

## 2023-07-14 NOTE — Assessment & Plan Note (Addendum)
 She developed severe anemia due to treatment She is scheduled for blood transfusion on today Plan to recheck her labs and give her more blood next week

## 2023-07-14 NOTE — Assessment & Plan Note (Addendum)
 This has improved with IV fluids We will continue IV fluid support today and tomorrow and more IV fluid next week

## 2023-07-14 NOTE — Progress Notes (Signed)
 Per Marton Sleeper, MD, okay to run blood at 321mL/hr

## 2023-07-15 ENCOUNTER — Inpatient Hospital Stay

## 2023-07-15 VITALS — BP 118/60 | HR 88 | Temp 98.2°F | Resp 18

## 2023-07-15 DIAGNOSIS — C541 Malignant neoplasm of endometrium: Secondary | ICD-10-CM

## 2023-07-15 LAB — TYPE AND SCREEN
ABO/RH(D): O POS
Antibody Screen: NEGATIVE
Unit division: 0

## 2023-07-15 LAB — BPAM RBC
Blood Product Expiration Date: 202506292359
ISSUE DATE / TIME: 202506121414
Unit Type and Rh: 5100

## 2023-07-15 MED ORDER — OXYCODONE HCL 5 MG PO TABS
5.0000 mg | ORAL_TABLET | Freq: Once | ORAL | Status: AC
  Administered 2023-07-15: 5 mg via ORAL
  Filled 2023-07-15: qty 1

## 2023-07-15 MED ORDER — SODIUM CHLORIDE 0.9 % IV SOLN: Freq: Once | INTRAVENOUS | Status: AC

## 2023-07-15 NOTE — Patient Instructions (Signed)
 Dehydration, Adult Dehydration is a condition in which there is not enough water or other fluids in the body. This happens when a person loses more fluids than they take in. Important organs cannot work right without the right amount of fluids. Any loss of fluids from the body can cause dehydration. Dehydration can be mild, worse, or very bad. It should be treated right away to keep it from getting very bad. What are the causes? Conditions that cause loss of water in the body. They include: Watery poop (diarrhea). Vomiting. Sweating a lot. Fever. Infection. Peeing (urinating) a lot. Not drinking enough fluids. Certain medicines, such as medicines that take extra fluid out of the body (diuretics). Lack of safe drinking water. Not being able to get enough water and food. What increases the risk? Having a long-term (chronic) illness that has not been treated the right way, such as: Diabetes. Heart disease. Kidney disease. Being 25 years of age or older. Having a disability. Living in a place that is high above the ground or sea (high in altitude). The thinner, drier air causes more fluid loss. Doing exercises that put stress on your body for a long time. Being active when in hot places. What are the signs or symptoms? Symptoms of dehydration depend on how bad it is. Mild or worse dehydration Thirst. Dry lips or dry mouth. Feeling dizzy or light-headed. Muscle cramps. Passing little pee or dark pee. Pee may be the color of tea. Headache. Very bad dehydration Changes in skin. Skin may: Be cold to the touch (clammy). Be blotchy or pale. Not go back to normal right after you pinch it and let it go. Little or no tears, pee, or sweat. Fast breathing. Low blood pressure. Weak pulse. Pulse that is more than 100 beats a minute when you are sitting still. Other changes, such as: Feeling very thirsty. Eyes that look hollow (sunken). Cold hands and feet. Being confused. Being very  tired (lethargic) or having trouble waking from sleep. Losing weight. Loss of consciousness. How is this treated? Treatment for this condition depends on how bad your dehydration is. Treatment should start right away. Do not wait until your condition gets very bad. Very bad dehydration is an emergency. You will need to go to a hospital. Mild or worse dehydration can be treated at home. You may be asked to: Drink more fluids. Drink an oral rehydration solution (ORS). This drink gives you the right amount of fluids, salts, and minerals (electrolytes). Very bad dehydration can be treated: With fluids through an IV tube. By correcting low levels of electrolytes in the body. By treating the problem that caused your dehydration. Follow these instructions at home: Oral rehydration solution If told by your doctor, drink an ORS: Make an ORS. Use instructions on the package. Start by drinking small amounts, about  cup (120 mL) every 5-10 minutes. Slowly drink more until you have had the amount that your doctor said to have.  Eating and drinking  Drink enough clear fluid to keep your pee pale yellow. If you were told to drink an ORS, finish the ORS first. Then, start slowly drinking other clear fluids. Drink fluids such as: Water. Do not drink only water. Doing that can make the salt (sodium) level in your body get too low. Water from ice chips you suck on. Fruit juice that you have added water to (diluted). Low-calorie sports drinks. Eat foods that have the right amounts of salts and minerals, such as bananas, oranges, potatoes,  tomatoes, or spinach. Do not drink alcohol. Avoid drinks that have caffeine or sugar. These include:: High-calorie sports drinks. Fruit juice that you did not add water to. Soda. Coffee or energy drinks. Avoid foods that are greasy or have a lot of fat or sugar. General instructions Take over-the-counter and prescription medicines only as told by your doctor. Do  not take sodium tablets. Doing that can make the salt level in your body get too high. Return to your normal activities as told by your doctor. Ask your doctor what activities are safe for you. Keep all follow-up visits. Your doctor may check and change your treatment. Contact a doctor if: You have pain in your belly (abdomen) and the pain: Gets worse. Stays in one place. You have a rash. You have a stiff neck. You get angry or annoyed more easily than normal. You are more tired or have a harder time waking than normal. You feel weak or dizzy. You feel very thirsty. Get help right away if: You have any symptoms of very bad dehydration. You vomit every time you eat or drink. Your vomiting gets worse, does not go away, or you vomit blood or green stuff. You are getting treatment, but symptoms are getting worse. You have a fever. You have a very bad headache. You have: Diarrhea that gets worse or does not go away. Blood in your poop (stool). This may cause poop to look black and tarry. No pee in 6-8 hours. Only a small amount of pee in 6-8 hours, and the pee is very dark. You have trouble breathing. These symptoms may be an emergency. Get help right away. Call 911. Do not wait to see if the symptoms will go away. Do not drive yourself to the hospital. This information is not intended to replace advice given to you by your health care provider. Make sure you discuss any questions you have with your health care provider. Document Revised: 08/17/2021 Document Reviewed: 08/17/2021 Elsevier Patient Education  2024 [employer].

## 2023-07-17 ENCOUNTER — Other Ambulatory Visit: Payer: Self-pay

## 2023-07-17 ENCOUNTER — Encounter (HOSPITAL_COMMUNITY): Payer: Self-pay | Admitting: Emergency Medicine

## 2023-07-17 ENCOUNTER — Emergency Department (HOSPITAL_COMMUNITY)

## 2023-07-17 ENCOUNTER — Inpatient Hospital Stay (HOSPITAL_COMMUNITY)
Admission: EM | Admit: 2023-07-17 | Discharge: 2023-08-02 | DRG: 683 | Disposition: E | Attending: Internal Medicine | Admitting: Internal Medicine

## 2023-07-17 DIAGNOSIS — Z886 Allergy status to analgesic agent status: Secondary | ICD-10-CM | POA: Diagnosis not present

## 2023-07-17 DIAGNOSIS — E1122 Type 2 diabetes mellitus with diabetic chronic kidney disease: Secondary | ICD-10-CM | POA: Diagnosis present

## 2023-07-17 DIAGNOSIS — D630 Anemia in neoplastic disease: Secondary | ICD-10-CM | POA: Diagnosis present

## 2023-07-17 DIAGNOSIS — D509 Iron deficiency anemia, unspecified: Secondary | ICD-10-CM | POA: Diagnosis present

## 2023-07-17 DIAGNOSIS — Z79899 Other long term (current) drug therapy: Secondary | ICD-10-CM

## 2023-07-17 DIAGNOSIS — Z833 Family history of diabetes mellitus: Secondary | ICD-10-CM

## 2023-07-17 DIAGNOSIS — E876 Hypokalemia: Secondary | ICD-10-CM | POA: Diagnosis present

## 2023-07-17 DIAGNOSIS — Z86711 Personal history of pulmonary embolism: Secondary | ICD-10-CM

## 2023-07-17 DIAGNOSIS — C786 Secondary malignant neoplasm of retroperitoneum and peritoneum: Secondary | ICD-10-CM | POA: Diagnosis present

## 2023-07-17 DIAGNOSIS — M549 Dorsalgia, unspecified: Secondary | ICD-10-CM | POA: Diagnosis not present

## 2023-07-17 DIAGNOSIS — Z66 Do not resuscitate: Secondary | ICD-10-CM | POA: Diagnosis not present

## 2023-07-17 DIAGNOSIS — Z8249 Family history of ischemic heart disease and other diseases of the circulatory system: Secondary | ICD-10-CM

## 2023-07-17 DIAGNOSIS — E119 Type 2 diabetes mellitus without complications: Secondary | ICD-10-CM

## 2023-07-17 DIAGNOSIS — N189 Chronic kidney disease, unspecified: Secondary | ICD-10-CM | POA: Diagnosis present

## 2023-07-17 DIAGNOSIS — D499 Neoplasm of unspecified behavior of unspecified site: Secondary | ICD-10-CM | POA: Diagnosis not present

## 2023-07-17 DIAGNOSIS — R0609 Other forms of dyspnea: Secondary | ICD-10-CM | POA: Diagnosis not present

## 2023-07-17 DIAGNOSIS — I10 Essential (primary) hypertension: Secondary | ICD-10-CM | POA: Diagnosis present

## 2023-07-17 DIAGNOSIS — Z515 Encounter for palliative care: Secondary | ICD-10-CM

## 2023-07-17 DIAGNOSIS — R338 Other retention of urine: Secondary | ICD-10-CM | POA: Diagnosis present

## 2023-07-17 DIAGNOSIS — K5909 Other constipation: Secondary | ICD-10-CM | POA: Diagnosis present

## 2023-07-17 DIAGNOSIS — Z7984 Long term (current) use of oral hypoglycemic drugs: Secondary | ICD-10-CM

## 2023-07-17 DIAGNOSIS — C541 Malignant neoplasm of endometrium: Secondary | ICD-10-CM | POA: Diagnosis present

## 2023-07-17 DIAGNOSIS — Z95828 Presence of other vascular implants and grafts: Secondary | ICD-10-CM

## 2023-07-17 DIAGNOSIS — K5903 Drug induced constipation: Secondary | ICD-10-CM

## 2023-07-17 DIAGNOSIS — R339 Retention of urine, unspecified: Secondary | ICD-10-CM | POA: Diagnosis not present

## 2023-07-17 DIAGNOSIS — Z7189 Other specified counseling: Secondary | ICD-10-CM | POA: Diagnosis not present

## 2023-07-17 DIAGNOSIS — Z90710 Acquired absence of both cervix and uterus: Secondary | ICD-10-CM

## 2023-07-17 DIAGNOSIS — Z881 Allergy status to other antibiotic agents status: Secondary | ICD-10-CM

## 2023-07-17 DIAGNOSIS — N179 Acute kidney failure, unspecified: Principal | ICD-10-CM | POA: Diagnosis present

## 2023-07-17 DIAGNOSIS — G4733 Obstructive sleep apnea (adult) (pediatric): Secondary | ICD-10-CM | POA: Diagnosis present

## 2023-07-17 DIAGNOSIS — N139 Obstructive and reflux uropathy, unspecified: Secondary | ICD-10-CM | POA: Diagnosis present

## 2023-07-17 DIAGNOSIS — D61818 Other pancytopenia: Secondary | ICD-10-CM | POA: Diagnosis present

## 2023-07-17 DIAGNOSIS — Z888 Allergy status to other drugs, medicaments and biological substances status: Secondary | ICD-10-CM

## 2023-07-17 DIAGNOSIS — Z9221 Personal history of antineoplastic chemotherapy: Secondary | ICD-10-CM | POA: Diagnosis not present

## 2023-07-17 DIAGNOSIS — I129 Hypertensive chronic kidney disease with stage 1 through stage 4 chronic kidney disease, or unspecified chronic kidney disease: Secondary | ICD-10-CM | POA: Diagnosis present

## 2023-07-17 DIAGNOSIS — N131 Hydronephrosis with ureteral stricture, not elsewhere classified: Secondary | ICD-10-CM | POA: Diagnosis present

## 2023-07-17 DIAGNOSIS — R109 Unspecified abdominal pain: Secondary | ICD-10-CM | POA: Diagnosis present

## 2023-07-17 DIAGNOSIS — G893 Neoplasm related pain (acute) (chronic): Secondary | ICD-10-CM | POA: Diagnosis present

## 2023-07-17 LAB — HEMOGLOBIN A1C
Hgb A1c MFr Bld: 6.3 % — ABNORMAL HIGH (ref 4.8–5.6)
Mean Plasma Glucose: 134.11 mg/dL

## 2023-07-17 LAB — CBC WITH DIFFERENTIAL/PLATELET
Abs Immature Granulocytes: 0.02 10*3/uL (ref 0.00–0.07)
Basophils Absolute: 0 10*3/uL (ref 0.0–0.1)
Basophils Relative: 0 %
Eosinophils Absolute: 0 10*3/uL (ref 0.0–0.5)
Eosinophils Relative: 0 %
HCT: 24.2 % — ABNORMAL LOW (ref 36.0–46.0)
Hemoglobin: 7.7 g/dL — ABNORMAL LOW (ref 12.0–15.0)
Immature Granulocytes: 0 %
Lymphocytes Relative: 10 %
Lymphs Abs: 0.8 10*3/uL (ref 0.7–4.0)
MCH: 26.7 pg (ref 26.0–34.0)
MCHC: 31.8 g/dL (ref 30.0–36.0)
MCV: 84 fL (ref 80.0–100.0)
Monocytes Absolute: 1 10*3/uL (ref 0.1–1.0)
Monocytes Relative: 13 %
Neutro Abs: 6.1 10*3/uL (ref 1.7–7.7)
Neutrophils Relative %: 77 %
Platelets: 205 10*3/uL (ref 150–400)
RBC: 2.88 MIL/uL — ABNORMAL LOW (ref 3.87–5.11)
RDW: 16.3 % — ABNORMAL HIGH (ref 11.5–15.5)
WBC: 7.9 10*3/uL (ref 4.0–10.5)
nRBC: 0 % (ref 0.0–0.2)

## 2023-07-17 LAB — CREATININE, SERUM
Creatinine, Ser: 4.89 mg/dL — ABNORMAL HIGH (ref 0.44–1.00)
GFR, Estimated: 9 mL/min — ABNORMAL LOW (ref >60)

## 2023-07-17 LAB — CBC
HCT: 22.6 % — ABNORMAL LOW (ref 36.0–46.0)
Hemoglobin: 7.4 g/dL — ABNORMAL LOW (ref 12.0–15.0)
MCH: 26.9 pg (ref 26.0–34.0)
MCHC: 32.7 g/dL (ref 30.0–36.0)
MCV: 82.2 fL (ref 80.0–100.0)
Platelets: 178 10*3/uL (ref 150–400)
RBC: 2.75 MIL/uL — ABNORMAL LOW (ref 3.87–5.11)
RDW: 16.3 % — ABNORMAL HIGH (ref 11.5–15.5)
WBC: 6.8 10*3/uL (ref 4.0–10.5)
nRBC: 0 % (ref 0.0–0.2)

## 2023-07-17 LAB — COMPREHENSIVE METABOLIC PANEL WITH GFR
ALT: 9 U/L (ref 0–44)
AST: 12 U/L — ABNORMAL LOW (ref 15–41)
Albumin: 2.6 g/dL — ABNORMAL LOW (ref 3.5–5.0)
Alkaline Phosphatase: 45 U/L (ref 38–126)
Anion gap: 14 (ref 5–15)
BUN: 69 mg/dL — ABNORMAL HIGH (ref 8–23)
CO2: 14 mmol/L — ABNORMAL LOW (ref 22–32)
Calcium: 9 mg/dL (ref 8.9–10.3)
Chloride: 114 mmol/L — ABNORMAL HIGH (ref 98–111)
Creatinine, Ser: 4.9 mg/dL — ABNORMAL HIGH (ref 0.44–1.00)
GFR, Estimated: 9 mL/min — ABNORMAL LOW (ref >60)
Glucose, Bld: 124 mg/dL — ABNORMAL HIGH (ref 70–99)
Potassium: 3.1 mmol/L — ABNORMAL LOW (ref 3.5–5.1)
Sodium: 142 mmol/L (ref 135–145)
Total Bilirubin: 0.5 mg/dL (ref 0.0–1.2)
Total Protein: 6.8 g/dL (ref 6.5–8.1)

## 2023-07-17 LAB — URINALYSIS, W/ REFLEX TO CULTURE (INFECTION SUSPECTED)
Bilirubin Urine: NEGATIVE
Glucose, UA: NEGATIVE mg/dL
Hgb urine dipstick: NEGATIVE
Ketones, ur: NEGATIVE mg/dL
Leukocytes,Ua: NEGATIVE
Nitrite: NEGATIVE
Protein, ur: NEGATIVE mg/dL
Specific Gravity, Urine: 1.014 (ref 1.005–1.030)
pH: 5 (ref 5.0–8.0)

## 2023-07-17 LAB — I-STAT CHEM 8, ED
BUN: 67 mg/dL — ABNORMAL HIGH (ref 8–23)
Calcium, Ion: 1.21 mmol/L (ref 1.15–1.40)
Chloride: 115 mmol/L — ABNORMAL HIGH (ref 98–111)
Creatinine, Ser: 5.1 mg/dL — ABNORMAL HIGH (ref 0.44–1.00)
Glucose, Bld: 123 mg/dL — ABNORMAL HIGH (ref 70–99)
HCT: 22 % — ABNORMAL LOW (ref 36.0–46.0)
Hemoglobin: 7.5 g/dL — ABNORMAL LOW (ref 12.0–15.0)
Potassium: 3.2 mmol/L — ABNORMAL LOW (ref 3.5–5.1)
Sodium: 144 mmol/L (ref 135–145)
TCO2: 14 mmol/L — ABNORMAL LOW (ref 22–32)

## 2023-07-17 LAB — PHOSPHORUS: Phosphorus: 5.4 mg/dL — ABNORMAL HIGH (ref 2.5–4.6)

## 2023-07-17 LAB — GLUCOSE, CAPILLARY: Glucose-Capillary: 100 mg/dL — ABNORMAL HIGH (ref 70–99)

## 2023-07-17 LAB — MAGNESIUM: Magnesium: 1.2 mg/dL — ABNORMAL LOW (ref 1.7–2.4)

## 2023-07-17 LAB — LIPASE, BLOOD: Lipase: 24 U/L (ref 11–51)

## 2023-07-17 MED ORDER — INSULIN ASPART 100 UNIT/ML IJ SOLN: 0.0000 [IU] | Freq: Every day | INTRAMUSCULAR | Status: DC

## 2023-07-17 MED ORDER — SODIUM CHLORIDE 0.9 % IV SOLN
1.0000 g | INTRAVENOUS | Status: DC
  Administered 2023-07-17 – 2023-07-18 (×2): 1 g via INTRAVENOUS
  Filled 2023-07-17 (×2): qty 10

## 2023-07-17 MED ORDER — ENOXAPARIN SODIUM 40 MG/0.4ML IJ SOSY
40.0000 mg | PREFILLED_SYRINGE | INTRAMUSCULAR | Status: DC
  Administered 2023-07-17: 40 mg via SUBCUTANEOUS
  Filled 2023-07-17: qty 0.4

## 2023-07-17 MED ORDER — SODIUM CHLORIDE 0.9 % IV SOLN: INTRAVENOUS | Status: AC

## 2023-07-17 MED ORDER — SEMAGLUTIDE 7 MG PO TABS: 1.0000 | ORAL_TABLET | Freq: Every day | ORAL | Status: DC

## 2023-07-17 MED ORDER — ONDANSETRON HCL 4 MG/2ML IJ SOLN
4.0000 mg | Freq: Four times a day (QID) | INTRAMUSCULAR | Status: DC | PRN
Start: 2023-07-17 — End: 2023-07-27

## 2023-07-17 MED ORDER — MAGNESIUM SULFATE 2 GM/50ML IV SOLN: 2.0000 g | Freq: Once | INTRAVENOUS | Status: DC

## 2023-07-17 MED ORDER — LACTATED RINGERS IV BOLUS
1000.0000 mL | Freq: Once | INTRAVENOUS | Status: AC
  Administered 2023-07-17: 1000 mL via INTRAVENOUS

## 2023-07-17 MED ORDER — POLYETHYLENE GLYCOL 3350 17 G PO PACK
17.0000 g | PACK | Freq: Two times a day (BID) | ORAL | Status: DC
  Administered 2023-07-17 – 2023-07-22 (×6): 17 g via ORAL
  Filled 2023-07-17 (×6): qty 1

## 2023-07-17 MED ORDER — GABAPENTIN 100 MG PO CAPS: 100.0000 mg | ORAL_CAPSULE | Freq: Every evening | ORAL | Status: DC | PRN

## 2023-07-17 MED ORDER — ONDANSETRON HCL 4 MG PO TABS
4.0000 mg | ORAL_TABLET | Freq: Four times a day (QID) | ORAL | Status: DC | PRN
Start: 2023-07-17 — End: 2023-07-27

## 2023-07-17 MED ORDER — ENSURE PLUS HIGH PROTEIN PO LIQD
237.0000 mL | Freq: Two times a day (BID) | ORAL | Status: DC
  Administered 2023-07-18 – 2023-07-24 (×6): 237 mL via ORAL

## 2023-07-17 MED ORDER — INSULIN ASPART 100 UNIT/ML IJ SOLN
0.0000 [IU] | Freq: Three times a day (TID) | INTRAMUSCULAR | Status: DC
  Administered 2023-07-18: 2 [IU] via SUBCUTANEOUS

## 2023-07-17 MED ORDER — OXYCODONE HCL 5 MG PO TABS
5.0000 mg | ORAL_TABLET | Freq: Four times a day (QID) | ORAL | Status: DC | PRN
  Administered 2023-07-18: 5 mg via ORAL
  Filled 2023-07-17 (×2): qty 1

## 2023-07-17 MED ORDER — EZETIMIBE 10 MG PO TABS
10.0000 mg | ORAL_TABLET | Freq: Every day | ORAL | Status: DC
  Administered 2023-07-17 – 2023-07-18 (×2): 10 mg via ORAL
  Filled 2023-07-17 (×2): qty 1

## 2023-07-17 MED ORDER — SIMVASTATIN 20 MG PO TABS
40.0000 mg | ORAL_TABLET | Freq: Every day | ORAL | Status: DC
  Administered 2023-07-17 – 2023-07-18 (×2): 40 mg via ORAL
  Filled 2023-07-17 (×2): qty 2

## 2023-07-17 MED ORDER — LACTATED RINGERS IV SOLN: INTRAVENOUS | Status: AC

## 2023-07-17 NOTE — ED Triage Notes (Signed)
 Pt to ER via EMS fro home with c/o left low back and abdominal pain.  States was diagnosed with acute kidney failure earlier this week and was started on new medications. Pt states she is not feeling any better and is still not urinating.

## 2023-07-17 NOTE — Consult Note (Signed)
 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  Foley catheter has been placed and serial creatinines ordered    Chief Complaint: Abdominal pain   History of Present Illness: Danielle Mullins is a 63 y.o.    Followed by oncology for stage III carcinosarcoma and recently noted to have a rising creatinine with baseline <1.0 April 2025; increased to 1.39 on 5/25, 3.70 on 6/10 and 3.31 07/14/2023 She presented to the ED today complaining of difficulty urinating and constipation.  Bladder scan was remarkable for a volume of 800 mL and a Foley catheter was placed with improvement in her symptoms. Creatinine had increased to 4.9/GFR 9 CT abdomen pelvis without contrast remarkable for a 13.9 x 7.5 x 11.2 heterogeneous pelvic mass and additional masses in the left upper quadrant and mid left abdomen suspicious for metastatic disease Mild to moderate bilateral hydronephrosis noted to the level of the pelvic mass felt secondary to extrinsic compression Status post robotic system laparoscopic total hysterectomy with bilateral BSO, bilateral PE on the 02/03/2023 for a large prolapsing uterine mass.  Final pathology carcinosarcoma.  Staging evaluation stage III.  Chemotherapy complicated by allergic reaction to paclitaxel  and severe anemia with docetaxel .   Recently noted to have a rising creatinine with baseline <1.0 April 2025; increased to  1.39 on 5/25, 3.70 on 6/10 and 3.31 07/14/2023 Her most bothersome complaint was constipation.  Abdominal pain much better after Foley catheter placement   Past Medical History:  Diagnosis Date   Arthritis    Diabetes mellitus without complication (HCC)    Fibroids    Heart disease, unspecified e   enlarged heart chamber- patient has referral   Hypertension    Ingrown toenail 01/13/2015   Mobitz type 2 second degree AV block 03/24/2020   Plantar fasciitis 01/13/2015   Porokeratosis 01/13/2015   Sesamoiditis 01/13/2015   Sleep apnea    Symptomatic bradycardia 03/24/2020    Past Surgical History:  Procedure Laterality Date   IR IMAGING GUIDED PORT INSERTION  03/04/2023   LYMPH NODE DISSECTION N/A 02/03/2023   Procedure: PELVIC LYMPH NODE DISSECTION;  Surgeon: Suzi Essex, MD;  Location: [facility];  Service: Gynecology;  Laterality: N/A;   ROBOTIC ASSISTED TOTAL HYSTERECTOMY WITH BILATERAL SALPINGO OOPHERECTOMY Bilateral 02/03/2023   Procedure: DIAGNOSTIC LAPAROSCOPY  XI ROBOTIC ASSISTED TOTAL HYSTERECTOMY WITH BILATERAL SALPINGO OOPHORECTOMY, CYSTOSCOPY, MINI LAPAROTOMY;  Surgeon: Suzi Essex, MD;  Location: [facility];  Service: Gynecology;  Laterality: Bilateral;    Home Medications:  Current Meds  Medication Sig   dexamethasone  (DECADRON ) 4 MG tablet Take 1 tablet (4 mg total) by mouth daily.   lidocaine -prilocaine  (EMLA ) cream Apply to affected area once (Patient taking differently: Apply 1 Application topically daily as needed (port access).)   linaclotide (LINZESS) 145 MCG CAPS capsule Take 145 mcg by mouth daily.   oxyCODONE  (ROXICODONE ) 5 MG immediate release tablet Take 1 tablet (5 mg total) by mouth every 6 (six) hours as needed for severe pain (pain score 7-10).   simvastatin  (  ZOCOR ) 40 MG tablet Take 40 mg by mouth at bedtime.    Allergies:  Allergies  Allergen Reactions   Paclitaxel  Other (See Comments)    Patient receiving treatment for the first time today.  Was in her first initial titration and started complaining of head tightness, stomach cramping, and diaphoresis. Infusion stopped. Fluids started along with IV pepcid . Symptoms resolved. Patient was 2ml in to the drug. Per Dr. Marton Sleeper D/C medication.   Celebrex [Celecoxib] Swelling and Other (See Comments)    Hx acute renal failure   Enulose  [Lactulose ] Other (See Comments)    Hyperglycemia    Nsaids Other (See Comments)    Hx acute renal failure    Family History  Problem Relation Age of Onset   Diabetes Mother    Hypertension Mother    Heart disease Father    Hypertension Father    Diabetes Brother    Breast cancer Neg Hx    Prostate cancer Neg Hx    Colon cancer Neg Hx    Endometrial cancer Neg Hx    Ovarian cancer Neg Hx    Pancreatic cancer Neg Hx    Cancer - Ovarian Neg Hx     Social History:  reports that she has never smoked. She has never used smokeless tobacco. She reports that she does not drink alcohol and does not use drugs.  ROS: A complete review of systems was performed.  All systems are negative except for pertinent findings as noted.  Physical Exam:  Vital signs in last 24 hours: Temp:  [97.7 F (36.5 C)-98.2 F (36.8 C)] 97.7 F (36.5 C) (06/15 2016) Pulse Rate:  [57-78] 67 (06/15 2016) Resp:  [14-18] 18 (06/15 2016) BP: (126-148)/(61-72) 126/61 (06/15 2016) SpO2:  [100 %] 100 % (06/15 2016) Weight:  [77 kg] 77 kg (06/15 1425) Constitutional:  Alert, No acute distress HEENT: [place] AT, moist mucus membranes.  Trachea midline, no masses Cardiovascular: Regular rate and rhythm, no clubbing, cyanosis, or edema. Respiratory: Normal respiratory effort, lungs clear bilaterally GI: Abdomen is soft, mild lower abdominal tenderness GU: No CVA tenderness Neurologic: Grossly intact, no focal deficits, moving all 4 extremities    Laboratory Data:  Recent Labs    07/17/23 1630 07/17/23 1634  WBC 7.9  --   HGB 7.7* 7.5*  HCT 24.2* 22.0*   Recent Labs     07/17/23 1630 07/17/23 1634  NA 142 144  K 3.1* 3.2*  CL 114* 115*  CO2 14*  --   GLUCOSE 124* 123*  BUN 69* 67*  CREATININE 4.90* 5.10*  CALCIUM 9.0  --     Radiologic Imaging:  CT images were personally reviewed and interpreted  CT ABDOMEN PELVIS WO CONTRAST Result Date: 07/17/2023 CLINICAL DATA:  Abdominal pain, acute, nonlocalized. EXAM: CT ABDOMEN AND PELVIS WITHOUT CONTRAST TECHNIQUE: Multidetector CT imaging of the abdomen and pelvis was performed following the standard protocol without IV contrast. RADIATION DOSE REDUCTION: This exam was performed according to the departmental dose-optimization program which includes automated exposure control, adjustment of the mA and/or kV according to patient size and/or use of iterative reconstruction technique. COMPARISON:  01/27/2023. FINDINGS: Lower chest: No acute abnormality. Hepatobiliary: A subcentimeter hypodensity is noted in the left lobe of the liver which is too small to further characterize. No biliary ductal dilatation. The gallbladder is without stones. Pancreas: Unremarkable. No pancreatic ductal dilatation or surrounding inflammatory changes. Spleen: Normal in size without focal abnormality. Adrenals/Urinary Tract: The adrenal glands are within normal limits. A  punctate calculus is noted in the mid right kidney. There is mild to moderate hydroureteronephrosis bilaterally to the level of a pelvic mass likely resulting in extrinsic compression. A Foley catheter is noted in the urinary bladder Stomach/Bowel: The stomach is within normal limits. No bowel obstruction, free air, or pneumatosis is seen. A moderate to large amount of retained stool is present in the colon. Normal appendix is identified in the right lower quadrant. Vascular/Lymphatic: Aortic atherosclerosis. No lymphadenopathy is seen. Reproductive: There is heterogeneous enlargement of the uterus measuring 13.9 x 7.5 x 11.2 cm. No masses are identified in the abdomen and pelvis  measuring 6.9 and 5.0 cm in the left lower quadrant, 6.8 cm in the mid left abdomen, and a 0.6 cm in the left upper quadrant posterior to the spleen. Other: No abdominopelvic ascites. Musculoskeletal: Degenerative changes are present in the thoracolumbar spine. No acute osseous abnormality is seen. IMPRESSION: 1. Enlarged heterogeneous uterus, compatible with known leiomyosarcoma/endometrial cancer. Multiple new masses are identified in the pelvis, left upper quadrant, and mid left abdomen, suspicious for metastatic disease. 2. Mild to moderate bilateral hydroureteronephrosis to the level of the pelvic mass and likely resulting from extrinsic compression. 3. Moderate to large amount of retained stool in the colon. 4. Aortic atherosclerosis. Electronically Signed   By: Wyvonnia Heimlich M.D.   On: 07/17/2023 17:08      07/17/2023, 9:58 PM  Darlynn Elam,  MD

## 2023-07-17 NOTE — Plan of Care (Signed)

## 2023-07-17 NOTE — ED Provider Notes (Signed)
 [facility] Provider Note   CSN: [id number] Arrival date & time: 07/17/23  1403     History Chief Complaint  Patient presents with   Back Pain   Abdominal Pain    Danielle Mullins is a 63 y.o. female w/ PMHx diabetes, hypertension, acute renal failure, endometrial cancer on chemotherapy, constipation who presents to the ED for evaluation of urinary retention and constipation.  Patient reports she has not been able to urinate approximately 3 to 4 days.  She reports she feels the urge to go but has not been able to urinate.  She also reports constipation.  Last bowel movement 2 days ago with small hard pellets.  She states she is on opioid pain medication for her cancer.  She states she has not taken any laxatives to assist with constipation.  No nausea or vomiting.  No fever or chills.  She does endorse abdominal pain.    Physical Exam Updated Vital Signs BP 139/72 (BP Location: Right Arm)   Pulse 78   Temp 98.2 F (36.8 C) (Oral)   Resp 14   Ht 5' 6 (1.676 m)   Wt 77 kg   SpO2 100%   BMI 27.40 kg/m  Physical Exam Vitals and nursing note reviewed.  Constitutional:      General: She is not in acute distress.    Appearance: She is well-developed.     Comments: Appears very uncomfortable   HENT:     Mouth/Throat:     Mouth: Mucous membranes are dry.   Eyes:     Conjunctiva/sclera: Conjunctivae normal.    Cardiovascular:     Rate and Rhythm: Normal rate and regular rhythm.     Heart sounds: Normal heart sounds. No murmur heard. Pulmonary:     Effort: Pulmonary effort is normal. No respiratory distress.     Breath sounds: Normal breath sounds.  Abdominal:     Palpations: Abdomen is soft.     Tenderness: There is generalized abdominal tenderness.   Musculoskeletal:        General: No swelling.     Cervical back: Neck supple.   Skin:    General: Skin is warm and dry.     Capillary Refill: Capillary refill takes less  than 2 seconds.   Neurological:     Mental Status: She is alert.     ED Results / Procedures / Treatments   Labs (all labs ordered are listed, but only abnormal results are displayed) Labs Reviewed  CBC WITH DIFFERENTIAL/PLATELET - Abnormal; Notable for the following components:      Result Value   RBC 2.88 (*)    Hemoglobin 7.7 (*)    HCT 24.2 (*)    RDW 16.3 (*)    All other components within normal limits  COMPREHENSIVE METABOLIC PANEL WITH GFR - Abnormal; Notable for the following components:   Potassium 3.1 (*)    Chloride 114 (*)    CO2 14 (*)    Glucose, Bld 124 (*)    BUN 69 (*)    Creatinine, Ser 4.90 (*)    Albumin 2.6 (*)    AST 12 (*)    GFR, Estimated 9 (*)    All other components within normal limits  MAGNESIUM  - Abnormal; Notable for the following components:   Magnesium  1.2 (*)    All other components within normal limits  PHOSPHORUS - Abnormal; Notable for the following components:   Phosphorus 5.4 (*)  All other components within normal limits  URINALYSIS, W/ REFLEX TO CULTURE (INFECTION SUSPECTED) - Abnormal; Notable for the following components:   Bacteria, UA RARE (*)    All other components within normal limits  I-STAT CHEM 8, ED - Abnormal; Notable for the following components:   Potassium 3.2 (*)    Chloride 115 (*)    BUN 67 (*)    Creatinine, Ser 5.10 (*)    Glucose, Bld 123 (*)    TCO2 14 (*)    Hemoglobin 7.5 (*)    HCT 22.0 (*)    All other components within normal limits  LIPASE, BLOOD    EKG None  Radiology CT ABDOMEN PELVIS WO CONTRAST Result Date: 07/17/2023 CLINICAL DATA:  Abdominal pain, acute, nonlocalized. EXAM: CT ABDOMEN AND PELVIS WITHOUT CONTRAST TECHNIQUE: Multidetector CT imaging of the abdomen and pelvis was performed following the standard protocol without IV contrast. RADIATION DOSE REDUCTION: This exam was performed according to the departmental dose-optimization program which includes automated exposure control,  adjustment of the mA and/or kV according to patient size and/or use of iterative reconstruction technique. COMPARISON:  01/27/2023. FINDINGS: Lower chest: No acute abnormality. Hepatobiliary: A subcentimeter hypodensity is noted in the left lobe of the liver which is too small to further characterize. No biliary ductal dilatation. The gallbladder is without stones. Pancreas: Unremarkable. No pancreatic ductal dilatation or surrounding inflammatory changes. Spleen: Normal in size without focal abnormality. Adrenals/Urinary Tract: The adrenal glands are within normal limits. A punctate calculus is noted in the mid right kidney. There is mild to moderate hydroureteronephrosis bilaterally to the level of a pelvic mass likely resulting in extrinsic compression. A Foley catheter is noted in the urinary bladder Stomach/Bowel: The stomach is within normal limits. No bowel obstruction, free air, or pneumatosis is seen. A moderate to large amount of retained stool is present in the colon. Normal appendix is identified in the right lower quadrant. Vascular/Lymphatic: Aortic atherosclerosis. No lymphadenopathy is seen. Reproductive: There is heterogeneous enlargement of the uterus measuring 13.9 x 7.5 x 11.2 cm. No masses are identified in the abdomen and pelvis measuring 6.9 and 5.0 cm in the left lower quadrant, 6.8 cm in the mid left abdomen, and a 0.6 cm in the left upper quadrant posterior to the spleen. Other: No abdominopelvic ascites. Musculoskeletal: Degenerative changes are present in the thoracolumbar spine. No acute osseous abnormality is seen. IMPRESSION: 1. Enlarged heterogeneous uterus, compatible with known leiomyosarcoma/endometrial cancer. Multiple new masses are identified in the pelvis, left upper quadrant, and mid left abdomen, suspicious for metastatic disease. 2. Mild to moderate bilateral hydroureteronephrosis to the level of the pelvic mass and likely resulting from extrinsic compression. 3. Moderate to  large amount of retained stool in the colon. 4. Aortic atherosclerosis. Electronically Signed   By: Wyvonnia Heimlich M.D.   On: 07/17/2023 17:08    Medications Ordered in ED Medications  magnesium  sulfate IVPB 2 g 50 mL (has no administration in time range)  lactated ringers  infusion (has no administration in time range)  lactated ringers  bolus 1,000 mL (1,000 mLs Intravenous New Bag/Given 07/17/23 1705)    ED Course/ Medical Decision Making/ A&P  ASLEY BASKERVILLE is a 64 y.o. female presents as detailed above  Differential ddx: Constipation, acute renal failure, urinary retention. UTI, pyelonephritis, pain from neoplastic disease, adverse effect of chemotherapy, pancreatitis, worsening neoplastic disease  On arrival, patient afebrile hemodynamically stable no hypoxia or respiratory distress.  Lower abdominal tenderness.  ED Work-up: Please see details  of labs and imaging listed above.  Patient found to have worsening renal function with BUN/creatinine now 69/4.9 which is increased from 63/3.31 3 days ago (6/12).  Patient received 1 L fluid bolus followed by 100 mL an hour infusion. Patient also with hypokalemia 3.1.  Due to worsening renal failure were not replaced potassium in ED.   Magnesium  1.2 we will administer 2 g of IV potassium.   Urinalysis without signs of infection.  Bladder scan revealed  Foley catheter placed with appropriate output Patient improvement of abdominal pain.  CT abdomen pelvis without contrast obtained.  Without contrast due to worsening renal function. CT revealed worsening neoplastic disease and hydronephrosis.  Hydronephrosis likely secondary to compression from neoplastic disease.    Overall impression worsening renal failure, AKI, worsening neoplastic disease and hydronephrosis, hypomagnesemia, hypokalemia  Case discussed with hospitalist team. Patient will be admitted to their team for further evaluation and treatment.   Patient seen with  supervising physician who agrees with plan.  Final Clinical Impression(s) / ED Diagnoses Final diagnoses:  Acute renal failure, unspecified acute renal failure type (HCC)  Urinary retention  Drug-induced constipation  Hypokalemia  Hypomagnesemia  Neoplastic disease   Angele Keller, DO PGY-3 Emergency Medicine    Angele Keller, DO 07/17/23 2338    Albertus Hughs, DO 07/18/23 2327

## 2023-07-17 NOTE — H&P (Signed)
 History and Physical    Patient: Danielle Mullins UJW:[id number] DOB: August 15, 1960 DOA: 07/17/2023 DOS: the patient was seen and examined on 07/17/2023 PCP: Adan Holms, FNP  Patient coming from: {Point_of_Origin:[address]}  Chief Complaint:  Chief Complaint  Patient presents with   Back Pain   Abdominal Pain   HPI: Danielle Mullins is a 63 y.o. female with medical history significant of ***  Review of Systems: {ROS_Text:26778} Past Medical History:  Diagnosis Date   Arthritis    Diabetes mellitus without complication (HCC)    Fibroids    Heart disease, unspecified e   enlarged heart chamber- patient has referral   Hypertension    Ingrown toenail 01/13/2015   Mobitz type 2 second degree AV block 03/24/2020   Plantar fasciitis 01/13/2015   Porokeratosis 01/13/2015   Sesamoiditis 01/13/2015   Sleep apnea    Symptomatic bradycardia 03/24/2020   Past Surgical History:  Procedure Laterality Date   IR IMAGING GUIDED PORT INSERTION  03/04/2023   LYMPH NODE DISSECTION N/A 02/03/2023   Procedure: PELVIC LYMPH NODE DISSECTION;  Surgeon: Suzi Essex, MD;  Location: [facility];  Service: Gynecology;  Laterality: N/A;   ROBOTIC ASSISTED TOTAL HYSTERECTOMY WITH BILATERAL SALPINGO OOPHERECTOMY Bilateral 02/03/2023   Procedure: DIAGNOSTIC LAPAROSCOPY  XI ROBOTIC ASSISTED TOTAL HYSTERECTOMY WITH BILATERAL SALPINGO OOPHORECTOMY, CYSTOSCOPY, MINI LAPAROTOMY;  Surgeon: Suzi Essex, MD;  Location: [facility];  Service: Gynecology;  Laterality: Bilateral;   Social History:  reports that she has never smoked. She has never used smokeless tobacco. She reports that she does not drink alcohol and does not use drugs.  Allergies  Allergen Reactions   Paclitaxel  Other (See Comments)    Patient receiving treatment for the first time today. Was in her first initial titration and started complaining of head tightness, stomach cramping, and diaphoresis. Infusion stopped. Fluids started along with IV  pepcid . Symptoms resolved. Patient was 2ml in to the drug. Per Dr. Marton Sleeper D/C medication.   Celecoxib Swelling    Family History  Problem Relation Age of Onset   Diabetes Mother    Hypertension Mother    Heart disease Father    Hypertension Father    Diabetes Brother    Breast cancer Neg Hx    Prostate cancer Neg Hx    Colon cancer Neg Hx    Endometrial cancer Neg Hx    Ovarian cancer Neg Hx    Pancreatic cancer Neg Hx    Cancer - Ovarian Neg Hx     Prior to Admission medications   Medication Sig Start Date End Date Taking? Authorizing Provider  dexamethasone  (DECADRON ) 4 MG tablet Take 1 tablet (4 mg total) by mouth daily. 07/14/23   [name], MD  ezetimibe  (ZETIA ) 10 MG tablet TAKE 1 TABLET BY MOUTH EVERY DAY 04/27/23   Knox Perl, MD  gabapentin  (NEURONTIN ) 100 MG capsule Take 100 mg by mouth at bedtime as needed (nerve pain).    [provider]  lidocaine -prilocaine  (EMLA ) cream Apply to affected area once 06/03/23   [name], MD  lisinopril  (ZESTRIL ) 10 MG tablet Take 10 mg by mouth daily. On hold 6/10-NG    [provider]  metFORMIN  (GLUCOPHAGE ) 500 MG tablet Take 500 mg by mouth 2 (two) times daily with a meal. On hold 6/10-NG    [provider]  ondansetron  (ZOFRAN ) 8 MG tablet Take 1 tablet (8 mg total) by mouth every 8 (eight) hours as needed for nausea or vomiting. Start on the third  day after chemotherapy. 02/23/23   [name], MD  oxyCODONE  (ROXICODONE ) 5 MG immediate release tablet Take 1 tablet (5 mg total) by mouth every 6 (six) hours as needed for severe pain (pain score 7-10). 06/24/23   [name], MD  polyethylene glycol (MIRALAX / GLYCOLAX) 17 g packet Take 17 g by mouth 2 (two) times daily.    [provider]  prochlorperazine  (COMPAZINE ) 10 MG tablet Take 1 tablet (10 mg total) by mouth every 6 (six) hours as needed for nausea or vomiting. 02/23/23   [name], MD  RYBELSUS 7 MG TABS Take 1 tablet by mouth daily. On hold  6/10_NG 12/24/22   [provider]  senna (SENOKOT) 8.6 MG TABS tablet Take 2 tablets (17.2 mg total) by mouth 2 (two) times daily. 06/24/23   [name], MD  simvastatin  (ZOCOR ) 40 MG tablet Take 40 mg by mouth at bedtime. 03/14/20   [provider]    Physical Exam: Vitals:   07/17/23 1413 07/17/23 1425 07/17/23 1615 07/17/23 1856  BP: 139/72  128/64 (!) 148/66  Pulse: 78  (!) 57 63  Resp: 14  17 18   Temp: 98.2 F (36.8 C)   97.8 F (36.6 C)  TempSrc: Oral   Oral  SpO2: 100%  100% 100%  Weight:  77 kg    Height:  5' 6 (1.676 m)     *** Data Reviewed: {Tip this will not be part of the note when signed- Document your independent interpretation of telemetry tracing, EKG, lab, Radiology test or any other diagnostic tests. Add any new diagnostic test ordered today. (Optional):26781} {Results:26384}  Assessment and Plan: No notes have been filed under this hospital service. Service: Hospitalist     Advance Care Planning:   Code Status: Full Code ***  Consults: ***  Family Communication: ***  Severity of Illness: {Observation/Inpatient:21159}  AuthorCarolin Chyle, MD 07/17/2023 7:44 PM  For on call review [email or web address].

## 2023-07-18 ENCOUNTER — Other Ambulatory Visit: Payer: Self-pay | Admitting: Hematology and Oncology

## 2023-07-18 ENCOUNTER — Encounter: Payer: Self-pay | Admitting: Hematology and Oncology

## 2023-07-18 DIAGNOSIS — C541 Malignant neoplasm of endometrium: Secondary | ICD-10-CM | POA: Diagnosis not present

## 2023-07-18 DIAGNOSIS — N179 Acute kidney failure, unspecified: Secondary | ICD-10-CM | POA: Diagnosis not present

## 2023-07-18 DIAGNOSIS — N139 Obstructive and reflux uropathy, unspecified: Secondary | ICD-10-CM | POA: Diagnosis not present

## 2023-07-18 DIAGNOSIS — Z515 Encounter for palliative care: Secondary | ICD-10-CM

## 2023-07-18 DIAGNOSIS — Z7189 Other specified counseling: Secondary | ICD-10-CM

## 2023-07-18 DIAGNOSIS — M549 Dorsalgia, unspecified: Secondary | ICD-10-CM | POA: Diagnosis not present

## 2023-07-18 DIAGNOSIS — R339 Retention of urine, unspecified: Secondary | ICD-10-CM

## 2023-07-18 LAB — CBC
HCT: 23.3 % — ABNORMAL LOW (ref 36.0–46.0)
Hemoglobin: 7.5 g/dL — ABNORMAL LOW (ref 12.0–15.0)
MCH: 27.1 pg (ref 26.0–34.0)
MCHC: 32.2 g/dL (ref 30.0–36.0)
MCV: 84.1 fL (ref 80.0–100.0)
Platelets: 190 10*3/uL (ref 150–400)
RBC: 2.77 MIL/uL — ABNORMAL LOW (ref 3.87–5.11)
RDW: 16.3 % — ABNORMAL HIGH (ref 11.5–15.5)
WBC: 6.9 10*3/uL (ref 4.0–10.5)
nRBC: 0 % (ref 0.0–0.2)

## 2023-07-18 LAB — GLUCOSE, CAPILLARY
Glucose-Capillary: 143 mg/dL — ABNORMAL HIGH (ref 70–99)
Glucose-Capillary: 113 mg/dL — ABNORMAL HIGH (ref 70–99)
Glucose-Capillary: 109 mg/dL — ABNORMAL HIGH (ref 70–99)
Glucose-Capillary: 121 mg/dL — ABNORMAL HIGH (ref 70–99)

## 2023-07-18 LAB — COMPREHENSIVE METABOLIC PANEL WITH GFR
ALT: 9 U/L (ref 0–44)
AST: 12 U/L — ABNORMAL LOW (ref 15–41)
Albumin: 2.3 g/dL — ABNORMAL LOW (ref 3.5–5.0)
Alkaline Phosphatase: 47 U/L (ref 38–126)
Anion gap: 13 (ref 5–15)
BUN: 69 mg/dL — ABNORMAL HIGH (ref 8–23)
CO2: 14 mmol/L — ABNORMAL LOW (ref 22–32)
Calcium: 8.2 mg/dL — ABNORMAL LOW (ref 8.9–10.3)
Chloride: 114 mmol/L — ABNORMAL HIGH (ref 98–111)
Creatinine, Ser: 5.13 mg/dL — ABNORMAL HIGH (ref 0.44–1.00)
GFR, Estimated: 9 mL/min — ABNORMAL LOW (ref >60)
Glucose, Bld: 137 mg/dL — ABNORMAL HIGH (ref 70–99)
Potassium: 3.2 mmol/L — ABNORMAL LOW (ref 3.5–5.1)
Sodium: 141 mmol/L (ref 135–145)
Total Bilirubin: 0.5 mg/dL (ref 0.0–1.2)
Total Protein: 6.1 g/dL — ABNORMAL LOW (ref 6.5–8.1)

## 2023-07-18 MED ORDER — HYDROMORPHONE HCL 2 MG PO TABS: 4.0000 mg | ORAL_TABLET | ORAL | Status: DC | PRN

## 2023-07-18 MED ORDER — CHLORHEXIDINE GLUCONATE CLOTH 2 % EX PADS
6.0000 | MEDICATED_PAD | Freq: Every day | CUTANEOUS | Status: DC
  Administered 2023-07-18 – 2023-07-26 (×5): 6 via TOPICAL

## 2023-07-18 MED ORDER — HYDROMORPHONE HCL 2 MG PO TABS: 2.0000 mg | ORAL_TABLET | ORAL | Status: DC | PRN

## 2023-07-18 MED ORDER — HYDROMORPHONE HCL 1 MG/ML IJ SOLN
2.0000 mg | INTRAMUSCULAR | Status: DC | PRN
  Administered 2023-07-18 – 2023-07-19 (×5): 2 mg via INTRAVENOUS
  Filled 2023-07-18 (×5): qty 2

## 2023-07-18 MED ORDER — ENOXAPARIN SODIUM 30 MG/0.3ML IJ SOSY
30.0000 mg | PREFILLED_SYRINGE | INTRAMUSCULAR | Status: DC
  Administered 2023-07-18: 30 mg via SUBCUTANEOUS
  Filled 2023-07-18: qty 0.3

## 2023-07-18 NOTE — Progress Notes (Signed)
 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 P53 mixed/WT, MMRp CARIS: MSI stable. PIK3CA and p53 mutant, ER neg, Her2/neu 0% She just completed chemotherapy recently, her chemotherapy course was complicated by significant side effects Unfortunately, repeat CT imaging showed recurrent disease with carcinomatosis and obstructive uropathy The patient has primary refractory disease She is not a candidate for further chemotherapy, surgery or radiation and I explained this to the patient and her brother Her condition is considered terminal and I recommend we redirect her focus to pain control and quality of life  Pancytopenia, acquired (HCC) She developed severe anemia due to treatment and component of anemia related to chronic kidney disease Transfuse if hemoglobin is less than 7  Acute renal failure due to poor oral intake and obstructive uropathy She had received IV fluid support in the clinic last week Unfortunately, with obstructive uropathy, it is unlikely to improve without intervention Appreciate urology consult However, in view of significant disease burden in her abdomen, placing nephrostomy tube, while delaying progression to end-stage renal failure, is unlikely going to improve her quality of life I recommend consideration to withhold this intervention until further discussion with palliative care team  Other constipation The patient have issues with compliance taking laxatives CT imaging showed carcinomatosis and this likely contributed to her severe constipation Continue laxatives  as tolerated She is not a candidate for surgical intervention  Cancer associated pain The patient found oxycodone  not helpful I will switch her to Dilaudid  and appreciate palliative care consult to assist in pain management  Goals of care discussion I have goals of care discussion with the patient today and with her brother over the phone Her condition is considered terminal and her prognosis is very poor, estimated survival less than 30 days, certainly less than 2 weeks if we start IV fluid and blood transfusion support I do not think it is realistic for her to return back to home and residential hospice facility placement would be appropriate if the patient agrees to it I discussed CODE STATUS with the patient but she is clearly not ready Recommend palliative care consult to further discuss the role of palliative care, pain management and CODE STATUS with the patient when she is ready  Discharge planning She is not ready to be discharged home and will likely need residential hospice facility placement I will return to check on her tomorrow   [name], MD 07/18/2023 8:15 AM  Subjective:  Patient is well-known to me.  I was notified of her admission to the hospital.  She was seen in the clinic recently due to acute renal failure with poor oral intake, severe anemia and abdominal pain.  She received blood transfusion as well as IV fluid support in the clinic.  She was admitted through the emergency department due to severe abdominal pain.  Blood work and imaging studies are reviewed  This morning, she still complain of severe abdominal pain.  She was receiving IV fluid support.  I reviewed findings from blood work and imaging studies with the patient.  I spoke with her brother over the phone who is her immediate next of  kin.  We discussed poor prognosis and I explained to the patient why surgery and chemotherapy are no longer options.  Her condition is considered terminal and I tried to  redirect her focus on comfort and palliative care.  I attempted to discuss CODE STATUS but the patient is not ready to discuss this  Objective:  Vitals:   07/17/23 2016 07/18/23 0438  BP: 126/61 114/63  Pulse: 67 81  Resp: 18 18  Temp: 97.7 F (36.5 C) 98.1 F (36.7 C)  SpO2: 100% 100%     Intake/Output Summary (Last 24 hours) at 07/18/2023 0815 Last data filed at 07/18/2023 0600 Gross per 24 hour  Intake 1623.45 ml  Output --  Net 1623.45 ml

## 2023-07-18 NOTE — Plan of Care (Signed)

## 2023-07-18 NOTE — Progress Notes (Signed)
 PROGRESS NOTE  Danielle Mullins  NWG:[id number] DOB: 07-21-1960 DOA: 07/17/2023 PCP: Adan Holms, FNP  Consultants  Brief Narrative: 63 y.o. 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 Patient was recently noted to have rising creatinine from her baseline of less than 1 in April went to 1.39 then 3.7 then 3.31 just 3 days ago. In the ER it was 4.9. Workup shows urinary retention with patient having 800 cc in the bladder and Foley catheter was inserted. CT abdomen pelvis showed diffuse metastatic disease and pelvic abdominal masses with large pelvic mass. Patient suspected to have obstructive uropathy causing her AKI. Admitted to the hospital for further evaluation and treatment.  Initial plan was percutaneous nephrostomy tube per urology but patient seen by oncology AM of 6/16 and deemed likely terminal from cancer standpoint due to spread of disease.  Assessment & Plan: AKI:  Initial reason for admission.   Creatinine has been rising rapidly over the past several days.   Not candidate for percutaneous nephrostomy tube.   See below for further details    Advanced carcinoma:  Greatly appreciate oncology input. Not a candidate for further chemotherapy, surgery, radiation.  CT imaging showed recurrent disease with carcinomatosis and obstructive uropathy. Palliative care has been consulted for pain control and quality of life discussions   Diabetes:  Sliding scale insulin    Pancytopenia Secondary to malignancy.  Hemoglobin 7.5.  We will transfuse if it falls below 7 g.   Essential hypertension:  Resume home regimen once confirmed   Hypokalemia:  Continue to replace potassium  Constipation: - Continue laxatives as tolerated.  Liberalize pain regimen and continue to watch  for any worsening constipation           DVT prophylaxis:  enoxaparin  (LOVENOX ) injection 30 mg Start: 07/18/23 2000  Code Status:   Code Status: Full Code Family Communication: Daughter-in-law and niece present at bedside.  All questions answered Level of care: Med-Surg Status is: Inpatient Dispo: Awaiting palliative care input   Consults called: Urology, oncology, palliative care  Subjective: Patient lying in bed.  Still coming to terms with not being able to treat her cancer.  She has no current complaints and reports that her pain is under control.  No nausea.  Objective: Vitals:   07/17/23 1856 07/17/23 2016 07/18/23 0438 07/18/23 0816  BP: (!) 148/66 126/61 114/63 123/64  Pulse: 63 67 81 85  Resp: 18 18 18 19   Temp: 97.8 F (36.6 C) 97.7 F (36.5 C) 98.1 F (36.7 C) 98.1 F (36.7 C)  TempSrc: Oral Oral    SpO2: 100% 100% 100% 100%  Weight:      Height:        Intake/Output Summary (Last 24 hours) at 07/18/2023 1534 Last data filed at 07/18/2023 1100 Gross per 24 hour  Intake 1623.45 ml  Output 1 ml  Net 1622.45 ml   Filed Weights   07/17/23 1425  Weight: 77 kg   Body mass index is 27.4 kg/m.  Gen: 63 y.o. female in no apparent distress.  Nontoxic Pulm: Non-labored breathing.  Clear to auscultation bilaterally.  CV: Regular rate and rhythm. No murmur, rub, or gallop. No JVD GI: Abdomen soft, nondistended, tender to palpation with deep palpation bilateral upper quadrants with normoactive bowel sounds. No organomegaly or masses felt. Ext: Warm, no  deformities, no pedal edema Skin: No rashes, lesions  Neuro: Alert and oriented. No focal neurological deficits. Psych: Calm  Judgement and insight appear normal. Mood & affect appropriately flat   I have personally reviewed the following labs and images: CBC: Recent Labs  Lab 07/12/23 1334 07/14/23 1217 07/17/23 1630 07/17/23 1634 07/17/23 2150 07/18/23 0426  WBC 6.9 7.9 7.9  --  6.8 6.9  NEUTROABS  5.3 6.7 6.1  --   --   --   HGB 6.9* 6.5* 7.7* 7.5* 7.4* 7.5*  HCT 20.5* 19.5* 24.2* 22.0* 22.6* 23.3*  MCV 78.8* 79.6* 84.0  --  82.2 84.1  PLT 277 214 205  --  178 190   BMP &GFR Recent Labs  Lab 07/12/23 1334 07/14/23 1217 07/17/23 1630 07/17/23 1634 07/17/23 2150 07/18/23 0426  NA 137 140 142 144  --  141  K 3.4* 3.3* 3.1* 3.2*  --  3.2*  CL 106 106 114* 115*  --  114*  CO2 20* 15* 14*  --   --  14*  GLUCOSE 119* 124* 124* 123*  --  137*  BUN 52* 63* 69* 67*  --  69*  CREATININE 3.70* 3.31* 4.90* 5.10* 4.89* 5.13*  CALCIUM 8.3* 8.7* 9.0  --   --  8.2*  MG  --   --  1.2*  --   --   --   PHOS  --   --  5.4*  --   --   --    Estimated Creatinine Clearance: 11.8 mL/min (A) (by C-G formula based on SCr of 5.13 mg/dL (H)). Liver & Pancreas: Recent Labs  Lab 07/12/23 1334 07/14/23 1217 07/17/23 1630 07/18/23 0426  AST 12* 13* 12* 12*  ALT 5 7 9 9   ALKPHOS 58 57 45 47  BILITOT 0.3 1.0 0.5 0.5  PROT 7.0 7.0 6.8 6.1*  ALBUMIN 3.3* 2.8* 2.6* 2.3*   Recent Labs  Lab 07/17/23 1630  LIPASE 24   No results for input(s): AMMONIA in the last 168 hours. Diabetic: Recent Labs    07/17/23 2150  HGBA1C 6.3*   Recent Labs  Lab 07/17/23 2100 07/18/23 0817 07/18/23 1218  GLUCAP 100* 143* 113*   Cardiac Enzymes: No results for input(s): CKTOTAL, CKMB, CKMBINDEX, TROPONINI in the last 168 hours. No results for input(s): PROBNP in the last 8760 hours. Coagulation Profile: No results for input(s): INR, PROTIME in the last 168 hours. Thyroid  Function Tests: No results for input(s): TSH, T4TOTAL, FREET4, T3FREE, THYROIDAB in the last 72 hours. Lipid Profile: No results for input(s): CHOL, HDL, LDLCALC, TRIG, CHOLHDL, LDLDIRECT in the last 72 hours. Anemia Panel: No results for input(s): VITAMINB12, FOLATE, FERRITIN, TIBC, IRON , RETICCTPCT in the last 72 hours. Urine analysis:    Component Value Date/Time   COLORURINE  YELLOW 07/17/2023 1635   APPEARANCEUR CLEAR 07/17/2023 1635   LABSPEC 1.014 07/17/2023 1635   PHURINE 5.0 07/17/2023 1635   GLUCOSEU NEGATIVE 07/17/2023 1635   HGBUR NEGATIVE 07/17/2023 1635   BILIRUBINUR NEGATIVE 07/17/2023 1635   KETONESUR NEGATIVE 07/17/2023 1635   PROTEINUR NEGATIVE 07/17/2023 1635   NITRITE NEGATIVE 07/17/2023 1635   LEUKOCYTESUR NEGATIVE 07/17/2023 1635   Sepsis Labs: Invalid input(s): PROCALCITONIN, LACTICIDVEN  Microbiology: No results found for this or any previous visit (from the past 240 hours).  Radiology Studies: CT ABDOMEN PELVIS WO CONTRAST Result Date: 07/17/2023 CLINICAL DATA:  Abdominal pain, acute, nonlocalized. EXAM: CT ABDOMEN AND PELVIS WITHOUT CONTRAST TECHNIQUE: Multidetector CT imaging of the abdomen and  pelvis was performed following the standard protocol without IV contrast. RADIATION DOSE REDUCTION: This exam was performed according to the departmental dose-optimization program which includes automated exposure control, adjustment of the mA and/or kV according to patient size and/or use of iterative reconstruction technique. COMPARISON:  01/27/2023. FINDINGS: Lower chest: No acute abnormality. Hepatobiliary: A subcentimeter hypodensity is noted in the left lobe of the liver which is too small to further characterize. No biliary ductal dilatation. The gallbladder is without stones. Pancreas: Unremarkable. No pancreatic ductal dilatation or surrounding inflammatory changes. Spleen: Normal in size without focal abnormality. Adrenals/Urinary Tract: The adrenal glands are within normal limits. A punctate calculus is noted in the mid right kidney. There is mild to moderate hydroureteronephrosis bilaterally to the level of a pelvic mass likely resulting in extrinsic compression. A Foley catheter is noted in the urinary bladder Stomach/Bowel: The stomach is within normal limits. No bowel obstruction, free air, or pneumatosis is seen. A moderate to large  amount of retained stool is present in the colon. Normal appendix is identified in the right lower quadrant. Vascular/Lymphatic: Aortic atherosclerosis. No lymphadenopathy is seen. Reproductive: There is heterogeneous enlargement of the uterus measuring 13.9 x 7.5 x 11.2 cm. No masses are identified in the abdomen and pelvis measuring 6.9 and 5.0 cm in the left lower quadrant, 6.8 cm in the mid left abdomen, and a 0.6 cm in the left upper quadrant posterior to the spleen. Other: No abdominopelvic ascites. Musculoskeletal: Degenerative changes are present in the thoracolumbar spine. No acute osseous abnormality is seen. IMPRESSION: 1. Enlarged heterogeneous uterus, compatible with known leiomyosarcoma/endometrial cancer. Multiple new masses are identified in the pelvis, left upper quadrant, and mid left abdomen, suspicious for metastatic disease. 2. Mild to moderate bilateral hydroureteronephrosis to the level of the pelvic mass and likely resulting from extrinsic compression. 3. Moderate to large amount of retained stool in the colon. 4. Aortic atherosclerosis. Electronically Signed   By: Wyvonnia Heimlich M.D.   On: 07/17/2023 17:08    Scheduled Meds:  enoxaparin  (LOVENOX ) injection  30 mg Subcutaneous Q24H   ezetimibe   10 mg Oral Daily   feeding supplement  237 mL Oral BID BM   insulin  aspart  0-15 Units Subcutaneous TID WC   insulin  aspart  0-5 Units Subcutaneous QHS   polyethylene glycol  17 g Oral BID   simvastatin   40 mg Oral QHS   Continuous Infusions:  sodium chloride  100 mL/hr at 07/17/23 2046   cefTRIAXone  (ROCEPHIN )  IV 1 g (07/17/23 2037)   lactated ringers      magnesium  sulfate bolus IVPB       LOS: 1 day   35 minutes with more than 50% spent in reviewing records, counseling patient/family and coordinating care.  Trenton Frock, MD [facility] [email or web address] 07/18/2023, 3:34 PM

## 2023-07-18 NOTE — Consult Note (Signed)
 Consultation Note Date: 07/18/2023   Patient Name: Danielle Mullins  DOB: [birth year]  MRN: [id number]  Age / Sex: 63 y.o., female  PCP: Adan Holms, FNP Referring Physician: Trenton Frock, MD  Reason for Consultation: Establishing goals of care  HPI/Patient Profile: 63 y.o. 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   Patient recently completed chemotherapy and also had complications with side effects of chemotherapy, including an allergic reaction and severe anemia. Her cancer has progressed despite this; CT showed multiple new pelvic masses, LUQ and mid left abdomen metastases. In the ED, workup also showed urinary retention and abdominal pain was relieved by Foley catheter insertion. Urology was consulted. She is admitted for AKI due to obstructive uropathy. Cr increased to 5.13 today from 4.89 yesterday.  PMT has been consulted to assist with pain management and goals of care conversation. She faces complex medical decision making and anticipatory care needs.  Clinical Assessment and Goals of Care:  I have reviewed medical records including EPIC notes, labs and imaging, assessed the patient and then met at the bedside with patient and her brother to discuss diagnosis prognosis, GOC, EOL wishes, disposition and options.  I introduced Palliative Medicine as specialized medical care for people living with serious illness. It focuses on providing relief from the symptoms and stress of a serious illness. The goal is to improve quality of life for both the patient and the family.  We discussed a brief life review of the patient and then focused on their current illness.   I attempted to elicit values and goals of care important to the patient.    Medical History Review and Understanding:  We discussed  patient's acute illness in the context of their chronic comorbidities. Patient and her brother state that they understand the severity of patient's illness.   Palliative Symptoms: Left hip, back and abdominal pain 8-9/10 at its worst. Improves with repositioning and dilaudid  to 7/10 at best Nausea, intermittent  Discussion: Created space and opportunity for patient and family's thoughts and feelings on patient's current illness. Patient is not very talkative today, though she confirms that additional family members are planning to come see her. I offered to coordinate a family meeting to discuss her next steps and provide overview of her situation so that she is well-supported as she faces complex medical decision making. Encouraged her to speak with family and shared that I would follow up tomorrow on her preference for timing of this meeting.  We also discussed her pain management plan and options for improved pain control in detail. She has no relief with oxycodone  and has never tried any other medications. She is agreeable to increasing dose and frequency of oral dilaudid . Education on PRN scheduling was provided. Counseled on risks and benefits of long-acting opioids and potential initiation of fentanyl  patch based on her PRN usage in the next 24 hours.   Discussed the importance of continued conversation with family and the medical providers regarding overall plan of care and treatment options, ensuring decisions are within the context of the patient's values and GOCs.   Questions and concerns were addressed. The family was encouraged to call with questions or concerns.  PMT will continue to support holistically.   SUMMARY OF RECOMMENDATIONS   -Continue full code -Continue current care plan. Patient is processing her terminal illness and has been encouraged to speak  with family to determine who may want to be present for GOC discussion -Increased frequency and dosage of PO dilaudid  to 4mg   Q3H PRN. Continue IV dilaudid  2mg  Q2H PRN -Ongoing GOC discussions and symptom management -PMT will continue to follow and support   Prognosis:  Poor prognosis with stage 4 cancer and no further treatment options, worsening renal failure  Discharge Planning: To Be Determined      Primary Diagnoses: Present on Admission:  Obstructive uropathy  Hypertension  Endometrial cancer (HCC)  Microcytic anemia  Acute renal failure (HCC)  Hypokalemia    Physical Exam Vitals and nursing note reviewed.  Constitutional:      General: She is not in acute distress.    Appearance: She is ill-appearing.   Cardiovascular:     Rate and Rhythm: Normal rate.  Pulmonary:     Effort: Pulmonary effort is normal.   Neurological:     Mental Status: She is alert. Mental status is at baseline.   Psychiatric:        Behavior: Behavior normal.     Vital Signs: BP 123/64 (BP Location: Left Arm)   Pulse 85   Temp 98.1 F (36.7 C)   Resp 19   Ht 5' 6 (1.676 m)   Wt 77 kg   SpO2 100%   BMI 27.40 kg/m  Pain Scale: 0-10   Pain Score: 9    SpO2: SpO2: 100 % O2 Device:SpO2: 100 % O2 Flow Rate: .     MDM: high   Shakthi Scipio Alroy Jericho, PA-C  Palliative Medicine Team Team phone # [phone]  Thank you for allowing the Palliative Medicine Team to assist in the care of this patient. Please utilize secure chat with additional questions, if there is no response within 30 minutes please call the above phone number.  Palliative Medicine Team providers are available by phone from 7am to 7pm daily and can be reached through the team cell phone.  Should this patient require assistance outside of these hours, please call the patient's attending physician.

## 2023-07-18 NOTE — Progress Notes (Addendum)
 Subjective: First time meeting pt. She was accompanied by her sister. Reviewed the case and plan again. All questions were answered. Urology will be available to assist in necessary as goals of care are discussed.   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  Unable to complete chemotherapy.  Recurrence with high tumor burden on CT A/P with bilateral extrinsic compression of ureters  # Acute renal failure # Bilateral obstructive uropathy  SCr 5.13 today. She was WNL very recently and she will require renal decompression in the very near future.  Due to the significant tumor burden and at least 50% chance of stent failure due to extrinsic compression, percutaneous nephrostomy tubes were suggested.  In light of patient's poor prognosis, oncology has suggested that we revisit goals of care conversations before proceeding.  They are awaiting more family members to arrive.  All questions were answered to their satisfaction.    Urology will follow along peripherally while family works through these difficult decisions.  Please call with questions   Intake/Output from previous day: 06/15 0701 - 06/16 0700 In: 1623.5 [P.O.:600; I.V.:923; IV Piggyback:100.4] Out: -   Intake/Output this shift: Total I/O In: -  Out: 1 [Stool:1]  Physical Exam:  General: Alert and oriented CV: No cyanosis Lungs: equal chest rise   Lab Results: Recent Labs    07/17/23 1634 07/17/23 2150 07/18/23 0426  HGB 7.5* 7.4* 7.5*  HCT 22.0* 22.6* 23.3*   BMET Recent Labs    07/17/23 1630 07/17/23 1634 07/17/23 2150 07/18/23 0426  NA 142 144  --  141   K 3.1* 3.2*  --  3.2*  CL 114* 115*  --  114*  CO2 14*  --   --  14*  GLUCOSE 124* 123*  --  137*  BUN 69* 67*  --  69*  CREATININE 4.90* 5.10* 4.89* 5.13*  CALCIUM 9.0  --   --  8.2*  HGB 7.7* 7.5* 7.4* 7.5*  WBC 7.9  --  6.8 6.9     Studies/Results: CT ABDOMEN PELVIS WO CONTRAST Result Date: 07/17/2023 CLINICAL DATA:  Abdominal pain, acute, nonlocalized. EXAM: CT ABDOMEN AND PELVIS WITHOUT CONTRAST TECHNIQUE: Multidetector CT imaging of the abdomen and pelvis was performed following the standard protocol without IV contrast. RADIATION DOSE REDUCTION: This exam was performed according to the departmental dose-optimization program which includes automated exposure control, adjustment of the mA and/or kV according to patient size and/or use of iterative reconstruction technique. COMPARISON:  01/27/2023. FINDINGS: Lower chest: No acute abnormality. Hepatobiliary: A subcentimeter hypodensity is noted in the left lobe of the liver which is too small to further characterize. No biliary ductal dilatation. The gallbladder is without stones. Pancreas: Unremarkable. No pancreatic ductal dilatation or surrounding inflammatory changes. Spleen: Normal in size without focal abnormality. Adrenals/Urinary Tract: The adrenal glands are within normal limits. A punctate calculus is noted in the mid right kidney. There is mild to moderate hydroureteronephrosis bilaterally to the level of a pelvic mass likely resulting in extrinsic compression. A Foley catheter is noted in the urinary bladder Stomach/Bowel: The stomach is within normal limits. No bowel obstruction, free air, or  pneumatosis is seen. A moderate to large amount of retained stool is present in the colon. Normal appendix is identified in the right lower quadrant. Vascular/Lymphatic: Aortic atherosclerosis. No lymphadenopathy is seen. Reproductive: There is heterogeneous enlargement of the uterus measuring 13.9 x 7.5 x 11.2 cm. No masses are identified in the  abdomen and pelvis measuring 6.9 and 5.0 cm in the left lower quadrant, 6.8 cm in the mid left abdomen, and a 0.6 cm in the left upper quadrant posterior to the spleen. Other: No abdominopelvic ascites. Musculoskeletal: Degenerative changes are present in the thoracolumbar spine. No acute osseous abnormality is seen. IMPRESSION: 1. Enlarged heterogeneous uterus, compatible with known leiomyosarcoma/endometrial cancer. Multiple new masses are identified in the pelvis, left upper quadrant, and mid left abdomen, suspicious for metastatic disease. 2. Mild to moderate bilateral hydroureteronephrosis to the level of the pelvic mass and likely resulting from extrinsic compression. 3. Moderate to large amount of retained stool in the colon. 4. Aortic atherosclerosis. Electronically Signed   By: Wyvonnia Heimlich M.D.   On: 07/17/2023 17:08      LOS: 1 day   Alla Ar, NP [facility] Pager: [phone]  07/18/2023, 12:21 PM

## 2023-07-19 DIAGNOSIS — N179 Acute kidney failure, unspecified: Secondary | ICD-10-CM | POA: Diagnosis not present

## 2023-07-19 DIAGNOSIS — N139 Obstructive and reflux uropathy, unspecified: Secondary | ICD-10-CM | POA: Diagnosis not present

## 2023-07-19 DIAGNOSIS — C541 Malignant neoplasm of endometrium: Secondary | ICD-10-CM | POA: Diagnosis not present

## 2023-07-19 DIAGNOSIS — M549 Dorsalgia, unspecified: Secondary | ICD-10-CM | POA: Diagnosis not present

## 2023-07-19 DIAGNOSIS — K5903 Drug induced constipation: Secondary | ICD-10-CM

## 2023-07-19 LAB — GLUCOSE, CAPILLARY
Glucose-Capillary: 125 mg/dL — ABNORMAL HIGH (ref 70–99)
Glucose-Capillary: 138 mg/dL — ABNORMAL HIGH (ref 70–99)
Glucose-Capillary: 148 mg/dL — ABNORMAL HIGH (ref 70–99)

## 2023-07-19 LAB — BASIC METABOLIC PANEL WITH GFR
Anion gap: 17 — ABNORMAL HIGH (ref 5–15)
BUN: 77 mg/dL — ABNORMAL HIGH (ref 8–23)
CO2: 11 mmol/L — ABNORMAL LOW (ref 22–32)
Calcium: 8.8 mg/dL — ABNORMAL LOW (ref 8.9–10.3)
Chloride: 114 mmol/L — ABNORMAL HIGH (ref 98–111)
Creatinine, Ser: 6.42 mg/dL — ABNORMAL HIGH (ref 0.44–1.00)
GFR, Estimated: 7 mL/min — ABNORMAL LOW (ref >60)
Glucose, Bld: 149 mg/dL — ABNORMAL HIGH (ref 70–99)
Potassium: 3.8 mmol/L (ref 3.5–5.1)
Sodium: 142 mmol/L (ref 135–145)

## 2023-07-19 LAB — CBC
HCT: 23.9 % — ABNORMAL LOW (ref 36.0–46.0)
Hemoglobin: 7.4 g/dL — ABNORMAL LOW (ref 12.0–15.0)
MCH: 27 pg (ref 26.0–34.0)
MCHC: 31 g/dL (ref 30.0–36.0)
MCV: 87.2 fL (ref 80.0–100.0)
Platelets: 180 10*3/uL (ref 150–400)
RBC: 2.74 MIL/uL — ABNORMAL LOW (ref 3.87–5.11)
RDW: 16.8 % — ABNORMAL HIGH (ref 11.5–15.5)
WBC: 8.5 10*3/uL (ref 4.0–10.5)
nRBC: 0 % (ref 0.0–0.2)

## 2023-07-19 MED ORDER — ACETAMINOPHEN 650 MG RE SUPP: 650.0000 mg | Freq: Four times a day (QID) | RECTAL | Status: DC | PRN

## 2023-07-19 MED ORDER — BIOTENE DRY MOUTH MT LIQD
15.0000 mL | OROMUCOSAL | Status: DC | PRN
Start: 2023-07-19 — End: 2023-07-27

## 2023-07-19 MED ORDER — GLYCOPYRROLATE 0.2 MG/ML IJ SOLN
0.2000 mg | INTRAMUSCULAR | Status: DC | PRN
  Administered 2023-07-26 – 2023-07-27 (×5): 0.2 mg via INTRAVENOUS
  Filled 2023-07-19 (×7): qty 1

## 2023-07-19 MED ORDER — HYDROMORPHONE HCL 2 MG PO TABS
4.0000 mg | ORAL_TABLET | ORAL | Status: DC
  Administered 2023-07-19 – 2023-07-21 (×14): 4 mg via ORAL
  Filled 2023-07-19 (×15): qty 2

## 2023-07-19 MED ORDER — ACETAMINOPHEN 325 MG PO TABS: 650.0000 mg | ORAL_TABLET | Freq: Four times a day (QID) | ORAL | Status: DC | PRN

## 2023-07-19 MED ORDER — HYDROMORPHONE HCL 1 MG/ML IJ SOLN
2.0000 mg | INTRAMUSCULAR | Status: DC | PRN
  Administered 2023-07-19 – 2023-07-26 (×20): 2 mg via INTRAVENOUS
  Filled 2023-07-19 (×21): qty 2

## 2023-07-19 MED ORDER — POLYVINYL ALCOHOL 1.4 % OP SOLN: 1.0000 [drp] | Freq: Four times a day (QID) | OPHTHALMIC | Status: DC | PRN

## 2023-07-19 MED ORDER — LORAZEPAM 1 MG PO TABS: 1.0000 mg | ORAL_TABLET | ORAL | Status: DC | PRN

## 2023-07-19 MED ORDER — LORAZEPAM 2 MG/ML PO CONC: 1.0000 mg | ORAL | Status: DC | PRN

## 2023-07-19 MED ORDER — LORAZEPAM 2 MG/ML IJ SOLN
1.0000 mg | INTRAMUSCULAR | Status: DC | PRN
  Administered 2023-07-25 – 2023-07-26 (×4): 1 mg via INTRAVENOUS
  Filled 2023-07-19 (×4): qty 1

## 2023-07-19 NOTE — Progress Notes (Signed)
 Daily Progress Note   Patient Name: Danielle Mullins       Date: 07/19/2023 DOB: Dec 07, 1960  Age: 63 y.o. MRN#: [id number] Attending Physician: Trenton Frock, MD Primary Care Physician: Adan Holms, FNP Admit Date: 07/17/2023  Reason for Consultation/Follow-up: Establishing goals of care  Subjective: Medical records reviewed including progress notes, labs and imaging, MAR. Patient received 3 doses of PRN IV dilaudid  in the past 24 hours. Patient assessed at the bedside. Reports pain currently at 8/10. Discussed with RN who was already aware and returned with IV dilaudid . Patient's brother is present at the bedside and niece participated in conversation via Face Time from [place].   Created space and opportunity for patient and family's thoughts and feelings on patient's current illness. No other family members were requested to participate in GOC discussion and she was agreeable to starting the conversation during my visit today. She feels that if it is my time then its my time and has attempted to cope with her faith in God and knowing what will happen after her death. Emotional support and therapeutic listening was provided, acknowledging her feelings of shock with how sudden she received news this week of her terminal disease. She thought she was getting better before this Sunday. Her brother mentions they were trying to get some POA documentation completed and they discussed her preferences for burial and funeral colors this morning. Assistance with HCPOA/Living Will was offered and patient is agreeable.  Explored patient's preference for whether to proceed with nephrostomy tubes. Family shared their opinion that they would not want to prolong her suffering if this did not improve her QOL and I shared my agreement. Patient  decided against nephrostomy tube.  Hospice philosophy was reviewed and patient's preference for home vs hospice facility was explored. She has no preference. I recommended hospice facility to provide symptom relief with IV medications and family shared their agreement.  Recommended comfort care during hospital stay while awaiting hospice facility bed. Explained that patient would no longer receive aggressive medical interventions such as continuous vital signs, lab work, radiology testing, or medications not focused on comfort. All care would focus on how the patient is looking and feeling. This would include management of any symptoms that may cause discomfort, pain, shortness of breath, cough, nausea, agitation, anxiety, and/or secretions etc. Symptoms would be managed with medications and other non-pharmacological interventions such as spiritual support if requested, repositioning, music therapy, or therapeutic listening. Patient and family verbalized understanding and appreciation.     Questions and concerns addressed. PMT will continue to support holistically.   Length of Stay: 2   Physical Exam Vitals and nursing note reviewed.  Constitutional:      General: She is not in acute distress.    Appearance: She is ill-appearing.  HENT:     Head: Normocephalic and atraumatic.   Cardiovascular:     Rate and Rhythm: Normal rate.   Skin:    General: Skin is warm and dry.   Neurological:     Mental Status: She is alert and oriented to person, place, and time.   Psychiatric:        Mood and Affect: Affect is flat.  Behavior: Behavior normal.             Vital Signs: BP 115/61 (BP Location: Left Arm)   Pulse 92   Temp 97.8 F (36.6 C) (Oral)   Resp 19   Ht 5' 6 (1.676 m)   Wt 77 kg   SpO2 100%   BMI 27.40 kg/m  SpO2: SpO2: 100 % O2 Device: O2 Device: Room Air O2 Flow Rate:        Palliative Care Assessment & Plan   Patient Profile: 63 y.o. 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    Patient recently completed chemotherapy and also had complications with side effects of chemotherapy, including an allergic reaction and severe anemia. Her cancer has progressed despite this; CT showed multiple new pelvic masses, LUQ and mid left abdomen metastases. In the ED, workup also showed urinary retention and abdominal pain was relieved by Foley catheter insertion. Urology was consulted. She is admitted for AKI due to obstructive uropathy. Cr increased to 5.13 today from 4.89 yesterday.   PMT has been consulted to assist with pain management and goals of care conversation. She faces complex medical decision making and anticipatory care needs.  Assessment: Goals of care conversation Cancer associated pain, improving AKI 2/2 obstructive uropathy  Metastatic leiomyosarcoma Pancytopenia   Recommendations/Plan: Code status changed to DNR/DNI. Will sign gold form and place on patient's hard chart for discharge to hospice facility. Will also scan copy into EMR Transition to comfort focused care today Dilaudid  4mg  PO scheduled Q4H and 2mg  IV PRN Q15min for pain/air hunger/comfort Robinul PRN for excessive secretions Ativan  PRN for agitation/anxiety Zofran  PRN for nausea Liquifilm tears PRN for dry eyes May have comfort feeding Comfort cart for family Unrestricted visitations in the setting of EOL (per policy) Oxygen PRN 2L or less for comfort. No escalation.   TOC consulted for referral to hospice facility, appreciate their assistance Spiritual care consulted for completion of advanced directives. Patient would like to name her brother Cortland Ding Psychosocial and emotional support provided PMT will continue to follow and support   Prognosis:  < 2 weeks  Discharge Planning: Hospice facility  Care plan was  discussed with patient, patient's brother, niece, RN, hospitalist and oncologist    MDM high         Lavaris Sexson Alroy Jericho, PA-C  Palliative Medicine Team Team phone # [phone]  Thank you for allowing the Palliative Medicine Team to assist in the care of this patient. Please utilize secure chat with additional questions, if there is no response within 30 minutes please call the above phone number.  Palliative Medicine Team providers are available by phone from 7am to 7pm daily and can be reached through the team cell phone.  Should this patient require assistance outside of these hours, please call the patient's attending physician.

## 2023-07-19 NOTE — Progress Notes (Signed)
 Transition of Care [facility]) - Inpatient Brief Assessment   Patient Details  Name: KAMRI GOTSCH MRN: [id number] Date of Birth: Oct 10, 1960  [facility] CM/SW Contact:    Dane Dung, RN Phone Number: 07/19/2023, 1:35 PM   Clinical Narrative: CM met with the patient and family at the bedside to provide Medicare choice regarding Inpatient Hospice referral.  The patient/brother would prefer placement at [facility] facility.  The patient requests referral to be sent out tomorrow to the liaison to give patient time today to process that she is hospice appropriate.  I placed the referral with Madelene Schanz, RNCM with [facility] and asked that he wait to speak to the patient tomorrow.  Chaplain consult placed for assistance with HCPOA paperwork.  CM will continue to follow the patient for [facility] needs - pending evaluation for inpatient hospice at [facility] place - referral tomorrow.   Transition of Care Asessment: Insurance and Status: (P) Insurance coverage has been reviewed Patient has primary care physician: (P) Yes Home environment has been reviewed: (P) from home Prior level of function:: (P) Independent Prior/Current Home Services: (P) No current home services Social Drivers of Health Review: (P) SDOH reviewed needs interventions Readmission risk has been reviewed: (P) Yes Transition of care needs: (P) transition of care needs identified, TOC will continue to follow

## 2023-07-19 NOTE — Plan of Care (Signed)

## 2023-07-19 NOTE — Progress Notes (Signed)
 PROGRESS NOTE  Danielle Mullins  JYN:[id number] DOB: April 05, 1960 DOA: 07/17/2023 PCP: Adan Holms, FNP  Consultants  Brief Narrative: 63 y.o. 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 Patient was recently noted to have rising creatinine from her baseline of less than 1 in April went to 1.39 then 3.7 then 3.31 just 3 days ago. In the ER it was 4.9. Workup shows urinary retention with patient having 800 cc in the bladder and Foley catheter was inserted. CT abdomen pelvis showed diffuse metastatic disease and pelvic abdominal masses with large pelvic mass. Patient suspected to have obstructive uropathy causing her AKI. Admitted to the hospital for further evaluation and treatment.  Initial plan was percutaneous nephrostomy tube per urology but patient seen by oncology AM of 6/16 and deemed likely terminal from cancer standpoint due to spread of disease.  Assessment & Plan: Advanced carcinoma:  Greatly appreciate oncology input as well as palliative care. Not a candidate for further chemotherapy, surgery, radiation.  CT imaging showed recurrent disease with carcinomatosis and obstructive uropathy. Patient has elected to move to full comfort care.  Appreciate Palliative working for Hospice on discharge.    AKI:  Initial reason for admission.   Creatinine has been rising rapidly over the past several days.   Not candidate for percutaneous nephrostomy tube.   Will continue to rise and likely cause some uremia/confusion as we advance comfort care measures    Diabetes:  Liberalize diet   Pancytopenia Secondary to cancer.  Comfort care measures  Constipation: - Continue laxatives as tolerated.  Liberalize pain regimen and continue to watch for any worsening constipation     DVT  prophylaxis:  Comfort care  Code Status:   Code Status: Do not attempt resuscitation (DNR) - Comfort care Family Communication: Daughter-in-law and niece present at bedside.  All questions answered Level of care: Med-Surg Status is: Inpatient Dispo: Comfort care measures, awaiting Hospice upon discharge   Consults called: Urology, oncology, palliative care  Subjective: Patient and brother in room.  She had just soiled her bed prior to my entrance.  She had no complaints other than wanting to have her bed linens changed.  Did not express any needs at this time.  Reports diminished appetite but was able to eat some breakfast  Objective: Vitals:   07/18/23 1646 07/18/23 1952 07/19/23 0813 07/19/23 0925  BP: 96/60 126/75 115/61   Pulse: 86 94 92   Resp: 19 19 19    Temp: 97.7 F (36.5 C) 97.6 F (36.4 C) 97.8 F (36.6 C)   TempSrc:   Oral   SpO2: 100% 100% 100% 100%  Weight:      Height:        Intake/Output Summary (Last 24 hours) at 07/19/2023 1459 Last data filed at 07/19/2023 0600 Gross per 24 hour  Intake 2024 ml  Output 800 ml  Net 1224 ml   Filed Weights   07/17/23 1425  Weight: 77 kg   Body mass index is 27.4 kg/m.  Gen: 63 y.o. female in no apparent distress.  Nontoxic Pulm: Non-labored breathing.  Clear to auscultation bilaterally.  CV: Regular rate and rhythm.  Skin: No rashes, lesions  Neuro: Alert and oriented. No focal neurological deficits. Psych: Calm  Judgement and insight appear normal. Mood & affect appropriately flat   I have  personally reviewed the following labs and images: CBC: Recent Labs  Lab 07/14/23 1217 07/17/23 1630 07/17/23 1634 07/17/23 2150 07/18/23 0426 07/19/23 0836  WBC 7.9 7.9  --  6.8 6.9 8.5  NEUTROABS 6.7 6.1  --   --   --   --   HGB 6.5* 7.7* 7.5* 7.4* 7.5* 7.4*  HCT 19.5* 24.2* 22.0* 22.6* 23.3* 23.9*  MCV 79.6* 84.0  --  82.2 84.1 87.2  PLT 214 205  --  178 190 180   BMP &GFR Recent Labs  Lab 07/14/23 1217  07/17/23 1630 07/17/23 1634 07/17/23 2150 07/18/23 0426 07/19/23 0836  NA 140 142 144  --  141 142  K 3.3* 3.1* 3.2*  --  3.2* 3.8  CL 106 114* 115*  --  114* 114*  CO2 15* 14*  --   --  14* 11*  GLUCOSE 124* 124* 123*  --  137* 149*  BUN 63* 69* 67*  --  69* 77*  CREATININE 3.31* 4.90* 5.10* 4.89* 5.13* 6.42*  CALCIUM 8.7* 9.0  --   --  8.2* 8.8*  MG  --  1.2*  --   --   --   --   PHOS  --  5.4*  --   --   --   --    Estimated Creatinine Clearance: 9.4 mL/min (A) (by C-G formula based on SCr of 6.42 mg/dL (H)). Liver & Pancreas: Recent Labs  Lab 07/14/23 1217 07/17/23 1630 07/18/23 0426  AST 13* 12* 12*  ALT 7 9 9   ALKPHOS 57 45 47  BILITOT 1.0 0.5 0.5  PROT 7.0 6.8 6.1*  ALBUMIN 2.8* 2.6* 2.3*   Recent Labs  Lab 07/17/23 1630  LIPASE 24   No results for input(s): AMMONIA in the last 168 hours. Diabetic: Recent Labs    07/17/23 2150  HGBA1C 6.3*   Recent Labs  Lab 07/18/23 1218 07/18/23 1647 07/18/23 2211 07/19/23 0628 07/19/23 0814  GLUCAP 113* 109* 121* 125* 138*   Cardiac Enzymes: No results for input(s): CKTOTAL, CKMB, CKMBINDEX, TROPONINI in the last 168 hours. No results for input(s): PROBNP in the last 8760 hours. Coagulation Profile: No results for input(s): INR, PROTIME in the last 168 hours. Thyroid  Function Tests: No results for input(s): TSH, T4TOTAL, FREET4, T3FREE, THYROIDAB in the last 72 hours. Lipid Profile: No results for input(s): CHOL, HDL, LDLCALC, TRIG, CHOLHDL, LDLDIRECT in the last 72 hours. Anemia Panel: No results for input(s): VITAMINB12, FOLATE, FERRITIN, TIBC, IRON , RETICCTPCT in the last 72 hours. Urine analysis:    Component Value Date/Time   COLORURINE YELLOW 07/17/2023 1635   APPEARANCEUR CLEAR 07/17/2023 1635   LABSPEC 1.014 07/17/2023 1635   PHURINE 5.0 07/17/2023 1635   GLUCOSEU NEGATIVE 07/17/2023 1635   HGBUR NEGATIVE 07/17/2023 1635   BILIRUBINUR NEGATIVE  07/17/2023 1635   KETONESUR NEGATIVE 07/17/2023 1635   PROTEINUR NEGATIVE 07/17/2023 1635   NITRITE NEGATIVE 07/17/2023 1635   LEUKOCYTESUR NEGATIVE 07/17/2023 1635   Sepsis Labs: Invalid input(s): PROCALCITONIN, LACTICIDVEN  Microbiology: No results found for this or any previous visit (from the past 240 hours).  Radiology Studies: No results found.   Scheduled Meds:  Chlorhexidine  Gluconate Cloth  6 each Topical Daily   feeding supplement  237 mL Oral BID BM   HYDROmorphone   4 mg Oral Q4H   polyethylene glycol  17 g Oral BID   Continuous Infusions:     LOS: 2 days   35 minutes with more than 50% spent in  reviewing records, counseling patient/family and coordinating care.  Trenton Frock, MD [facility] [email or web address] 07/19/2023, 2:59 PM

## 2023-07-19 NOTE — Progress Notes (Signed)
 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 P53 mixed/WT, MMRp CARIS: MSI stable. PIK3CA and p53 mutant, ER neg, Her2/neu 0% She just completed chemotherapy recently, her chemotherapy course was complicated by significant side effects Unfortunately, repeat CT imaging showed recurrent disease with carcinomatosis and obstructive uropathy The patient has primary refractory disease She is not a candidate for further chemotherapy, surgery or radiation and I explained this to the patient and her brother Her condition is considered terminal and I recommend we redirect her focus to pain control and quality of life  Pancytopenia, acquired (HCC) She developed severe anemia due to treatment and component of anemia related to chronic kidney disease Transfuse if hemoglobin is less than 7, last the patient made informed decision to transition her care to comfort measures  Acute renal failure due to poor oral intake and obstructive uropathy She had received IV fluid support in the clinic last week Unfortunately, with obstructive uropathy, it is unlikely to improve without intervention Appreciate urology consult However, in view of significant disease burden in her abdomen, placing nephrostomy tube, while delaying progression to end-stage renal failure, is unlikely going to improve her quality of life I reinforced to the patient again today that I do not recommend placement of percutaneous nephrostomy tubes  Other constipation The patient have issues with compliance taking laxatives CT imaging showed  carcinomatosis and this likely contributed to her severe constipation Continue laxatives as tolerated She is not a candidate for surgical intervention  Cancer associated pain She report better pain control at 3 out of 10 with Dilaudid  Continue the same  Goals of care discussion I have goals of care discussion with the patient today and with her brother over the phone yesterday Her condition is considered terminal and her prognosis is very poor, estimated survival less than 30 days, certainly less than 2 weeks if we stop IV fluid and blood transfusion support I do not think it is realistic for her to return back to home and residential hospice facility placement would be appropriate if the patient agrees to it I discussed CODE STATUS with the patient again today but she is clearly not ready to discuss this further Appreciate assistance from palliative care consult team to continue to work with the patient with goals of care  Discharge planning She is not ready to be discharged home and will likely need residential hospice facility placement  Danielle Jacobs, MD 07/19/2023 7:51 AM  Subjective:  Patient is seen this morning.  She had some loose stool yesterday.  Her pain is better controlled.  She had multiple family members visiting her yesterday.  We discussed goals of care while hospitalized and I explained to the patient again why I do not recommend percutaneous nephrostomy tubes I tried to address goals of care/CODE STATUS with the patient but she does not seem that she wants to continue with the discussion with me  Objective:  Vitals:   07/18/23 1646 07/18/23 1952  BP: 96/60 126/75  Pulse: 86 94  Resp: 19 19  Temp: 97.7 F (36.5 C) 97.6 F (36.4 C)  SpO2: 100% 100%  Intake/Output Summary (Last 24 hours) at 07/19/2023 0751 Last data filed at 07/19/2023 0600 Gross per 24 hour  Intake 2024 ml  Output 801 ml  Net 1223 ml

## 2023-07-20 DIAGNOSIS — M549 Dorsalgia, unspecified: Secondary | ICD-10-CM | POA: Diagnosis not present

## 2023-07-20 DIAGNOSIS — C541 Malignant neoplasm of endometrium: Secondary | ICD-10-CM | POA: Diagnosis not present

## 2023-07-20 DIAGNOSIS — N179 Acute kidney failure, unspecified: Secondary | ICD-10-CM | POA: Diagnosis not present

## 2023-07-20 DIAGNOSIS — N139 Obstructive and reflux uropathy, unspecified: Secondary | ICD-10-CM | POA: Diagnosis not present

## 2023-07-20 NOTE — Plan of Care (Signed)
 Patient and providers have made the shared decision to transition to hospice in light of her significant disease progression. Nephrostomy tubes specifically were declined. Urology will sign off at this time. We are available for questions or concerns. Please call if needed.   Alla Ar, NP [facility] Pager: [phone]

## 2023-07-20 NOTE — Plan of Care (Signed)
   Problem: Education: Goal: Knowledge of General Education information will improve Description: Including pain rating scale, medication(s)/side effects and non-pharmacologic comfort measures Outcome: Progressing   Problem: Coping: Goal: Level of anxiety will decrease Outcome: Progressing

## 2023-07-20 NOTE — Progress Notes (Signed)
 PROGRESS NOTE  Danielle Mullins  VWU:[id number] DOB: November 30, 1960 DOA: 07/17/2023 PCP: Adan Holms, FNP  Consultants  Brief Narrative: 63 y.o. 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 Patient was recently noted to have rising creatinine from her baseline of less than 1 in April went to 1.39 then 3.7 then 3.31 just 3 days ago. In the ER it was 4.9. Workup shows urinary retention with patient having 800 cc in the bladder and Foley catheter was inserted. CT abdomen pelvis showed diffuse metastatic disease and pelvic abdominal masses with large pelvic mass. Patient suspected to have obstructive uropathy causing her AKI. Admitted to the hospital for further evaluation and treatment.  Initial plan was percutaneous nephrostomy tube per urology but patient seen by oncology AM of 6/16 and deemed likely terminal from cancer standpoint due to spread of disease.  Assessment & Plan: Advanced carcinoma:  - Patient continues expected decline. - No concerns at this time.  Family at bedside.  They would like to obtain healthcare power of attorney for her at this is their main issue at this time. - Otherwise continue current comfort care measures  AKI:  Initial reason for admission.   Creatinine has been rising rapidly over the past several days from 3-->6.4 before we stop checking. I did discuss with family that rate of rise would likely lead to uremia sooner rather than later and that she likely only has a few days left, although we are of course unsure exact timing. Much sleepier today than she has been, also sign of likely uremia   Diabetes:  Liberalize diet   Pancytopenia Secondary to cancer.  Comfort care measures  Constipation: - Continue laxatives as tolerated.  Liberalize pain  regimen and continue to watch for any worsening constipation     DVT prophylaxis:  Comfort care  Code Status:   Code Status: Do not attempt resuscitation (DNR) - Comfort care Family Communication: Niece and brother present at bedside.  All questions answered Level of care: Med-Surg Status is: Inpatient Dispo: Comfort care measures, awaiting Hospice upon discharge   Consults called: Urology, oncology, palliative care  Subjective: Patient sleeping on my exam this afternoon.  Niece and brother at bedside.  They have no concerns other than obtaining a notary to have her sign healthcare power of attorney.  Still awaiting input from next steps regarding inpatient hospice  Objective: Vitals:   07/19/23 0813 07/19/23 0925 07/19/23 2130 07/20/23 0447  BP: 115/61  121/63 124/64  Pulse: 92  100 (!) 103  Resp: 19  18 18   Temp: 97.8 F (36.6 C)  97.8 F (36.6 C) 97.7 F (36.5 C)  TempSrc: Oral     SpO2: 100% 100% 98% 100%  Weight:      Height:        Intake/Output Summary (Last 24 hours) at 07/20/2023 1503 Last data filed at 07/20/2023 0845 Gross per 24 hour  Intake 1010 ml  Output 900 ml  Net 110 ml   Filed Weights   07/17/23 1425  Weight: 77 kg   Body mass index is 27.4 kg/m.  Gen: 63 y.o. female in no apparent distress.  Nontoxic Pulm:Snoring, no adventitious breath sounds.   CV: Regular rate and rhythm.  Skin: No rashes, lesions  Neuro: Alert and oriented. No focal neurological deficits. Psych: Calm  Judgement and insight appear normal. Mood & affect appropriately flat   I have personally reviewed the following labs and images: CBC: Recent Labs  Lab 07/14/23 1217 07/17/23 1630 07/17/23 1634 07/17/23 2150 07/18/23 0426 07/19/23 0836  WBC 7.9 7.9  --  6.8 6.9 8.5  NEUTROABS 6.7 6.1  --   --   --   --   HGB 6.5* 7.7* 7.5* 7.4* 7.5* 7.4*  HCT 19.5* 24.2* 22.0* 22.6* 23.3* 23.9*  MCV 79.6* 84.0  --  82.2 84.1 87.2  PLT 214 205  --  178 190 180   BMP  &GFR Recent Labs  Lab 07/14/23 1217 07/17/23 1630 07/17/23 1634 07/17/23 2150 07/18/23 0426 07/19/23 0836  NA 140 142 144  --  141 142  K 3.3* 3.1* 3.2*  --  3.2* 3.8  CL 106 114* 115*  --  114* 114*  CO2 15* 14*  --   --  14* 11*  GLUCOSE 124* 124* 123*  --  137* 149*  BUN 63* 69* 67*  --  69* 77*  CREATININE 3.31* 4.90* 5.10* 4.89* 5.13* 6.42*  CALCIUM 8.7* 9.0  --   --  8.2* 8.8*  MG  --  1.2*  --   --   --   --   PHOS  --  5.4*  --   --   --   --    Estimated Creatinine Clearance: 9.4 mL/min (A) (by C-G formula based on SCr of 6.42 mg/dL (H)). Liver & Pancreas: Recent Labs  Lab 07/14/23 1217 07/17/23 1630 07/18/23 0426  AST 13* 12* 12*  ALT 7 9 9   ALKPHOS 57 45 47  BILITOT 1.0 0.5 0.5  PROT 7.0 6.8 6.1*  ALBUMIN 2.8* 2.6* 2.3*   Recent Labs  Lab 07/17/23 1630  LIPASE 24   No results for input(s): AMMONIA in the last 168 hours. Diabetic: Recent Labs    07/17/23 2150  HGBA1C 6.3*   Recent Labs  Lab 07/18/23 1647 07/18/23 2211 07/19/23 0628 07/19/23 0814 07/19/23 2132  GLUCAP 109* 121* 125* 138* 148*   Cardiac Enzymes: No results for input(s): CKTOTAL, CKMB, CKMBINDEX, TROPONINI in the last 168 hours. No results for input(s): PROBNP in the last 8760 hours. Coagulation Profile: No results for input(s): INR, PROTIME in the last 168 hours. Thyroid  Function Tests: No results for input(s): TSH, T4TOTAL, FREET4, T3FREE, THYROIDAB in the last 72 hours. Lipid Profile: No results for input(s): CHOL, HDL, LDLCALC, TRIG, CHOLHDL, LDLDIRECT in the last 72 hours. Anemia Panel: No results for input(s): VITAMINB12, FOLATE, FERRITIN, TIBC, IRON , RETICCTPCT in the last 72 hours. Urine analysis:    Component Value Date/Time   COLORURINE YELLOW 07/17/2023 1635   APPEARANCEUR CLEAR 07/17/2023 1635   LABSPEC 1.014 07/17/2023 1635   PHURINE 5.0 07/17/2023 1635   GLUCOSEU NEGATIVE 07/17/2023 1635   HGBUR NEGATIVE  07/17/2023 1635   BILIRUBINUR NEGATIVE 07/17/2023 1635   KETONESUR NEGATIVE 07/17/2023 1635   PROTEINUR NEGATIVE 07/17/2023 1635   NITRITE NEGATIVE 07/17/2023 1635   LEUKOCYTESUR NEGATIVE 07/17/2023 1635   Sepsis Labs: Invalid input(s): PROCALCITONIN, LACTICIDVEN  Microbiology: No results found for this or any previous visit (from the past 240 hours).  Radiology Studies: No results found.   Scheduled Meds:  Chlorhexidine  Gluconate Cloth  6 each Topical Daily   feeding supplement  237 mL Oral BID BM   HYDROmorphone   4 mg Oral Q4H   polyethylene glycol  17 g Oral BID   Continuous Infusions:  LOS: 3 days   35 minutes with more than 50% spent in reviewing records, counseling patient/family and coordinating care.  Trenton Frock, MD [facility] [email or web address] 07/20/2023, 3:03 PM

## 2023-07-20 NOTE — TOC Progression Note (Signed)
 Transition of Care [facility]) - Progression Note    Patient Details  Name: Danielle Mullins MRN: [id number] Date of Birth: 1960-09-26  Transition of Care [facility]) CM/SW Contact  Dane Dung, RN Phone Number: 07/20/2023, 9:59 AM  Clinical Narrative:    CM spoke with Madelene Schanz, RNCM with [facility] and patient requested that he follow up with her this afternoon for evaluation for Inpatient Hospice placement needs.  TOC Team will continue to follow the patient for needs.        Expected Discharge Plan and Services                                               Social Determinants of Health (SDOH) Interventions SDOH Screenings   Food Insecurity: No Food Insecurity (07/17/2023)  Housing: Low Risk  (07/17/2023)  Transportation Needs: No Transportation Needs (07/17/2023)  Utilities: Not At Risk (07/17/2023)  Depression (PHQ2-9): Low Risk  (02/28/2023)  Financial Resource Strain: High Risk (03/31/2023)  Tobacco Use: Low Risk  (07/17/2023)    Readmission Risk Interventions    07/19/2023    1:33 PM  Readmission Risk Prevention Plan  Transportation Screening Complete  PCP or Specialist Appt within 3-5 Days Complete  HRI or Home Care Consult Complete  Social Work Consult for Recovery Care Planning/Counseling Complete  Palliative Care Screening Complete  Medication Review [employer]) Complete

## 2023-07-20 NOTE — Progress Notes (Signed)
 This chaplain responded to PMT PA-Josseline's consult for spiritual care in the setting of creating the Pt. Advance Directive and naming the Pt. brother-Craig as HCPOA.   The chaplain offered the Pt. several opportunities to move forward with completing HCPOA. The Pt. did not make a commitment to any of the options.   This chaplain is available for F/U spiritual care as needed.   Chaplain Kathleene Papas [phone]

## 2023-07-20 NOTE — Progress Notes (Signed)
 Daily Progress Note   Patient Name: Danielle Mullins       Date: 07/20/2023 DOB: 1960-04-28  Age: 63 y.o. MRN#: [id number] Attending Physician: Trenton Frock, MD Primary Care Physician: Adan Holms, FNP Admit Date: 07/17/2023  Reason for Consultation/Follow-up: Establishing goals of care  Subjective: Medical records reviewed including progress notes, labs and imaging, MAR. Patient received 2 doses of PRN IV dilaudid  in the past 24 hours as well as scheduled PO dilaudid . Assessed at the bedside. She is quite sleepy but arousable. Denies pain or other distress. I provided her with opportunity to discuss care plan, hospice referral process, or other concerns. She shook her head no and then shook her head yes after I encouraged her to let me know if there is something weighing on her at all. No family was present during my visit. I discussed with [facility] hospital liaison.  Called patient's brother Danielle Mullins for ongoing palliative support. He shares that he will need to reschedule appointment he made today for POA documentation, as he did not have two witnesses. I again offered to coordinate with hospital chaplain to complete medical portion of this. He remains agreeable to plan for hospice facility and continues to try to support her in processing and reflecting on her situation.   Questions and concerns addressed. PMT will continue to support holistically.   Length of Stay: 3   Physical Exam Vitals and nursing note reviewed.  Constitutional:      General: She is not in acute distress.    Appearance: She is ill-appearing.  HENT:     Head: Normocephalic and atraumatic.   Cardiovascular:     Rate and Rhythm: Normal rate.   Skin:    General: Skin is warm and dry.   Neurological:     Mental Status: She is alert.     Comments: sleepy   Psychiatric:        Behavior: Behavior is slowed.             Vital Signs: BP 124/64 (BP Location: Left Arm)   Pulse (!) 103   Temp 97.7 F (36.5 C)   Resp 18   Ht 5' 6 (1.676 m)   Wt 77 kg   SpO2 100%   BMI 27.40 kg/m  SpO2: SpO2: 100 % O2 Device: O2 Device: Room Air O2 Flow Rate: O2 Flow Rate (L/min): 0 L/min      Palliative Care Assessment & Plan   Patient Profile: 63 y.o. 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    Patient recently completed chemotherapy and also had complications with side effects of chemotherapy, including an allergic reaction and severe anemia. Her cancer has progressed despite this; CT showed multiple new pelvic masses, LUQ and mid left abdomen metastases. In the ED, workup also showed urinary retention and abdominal pain was relieved by Foley catheter insertion. Urology was consulted. She is admitted for AKI due to obstructive uropathy. Cr increased to 5.13 today from 4.89 yesterday.   PMT has been consulted to assist with pain management and goals of care conversation. She  faces complex medical decision making and anticipatory care needs.  Assessment: End of life care  Recommendations/Plan: Continue DNR/DNI Continue comfort focused care, no adjustments required Dilaudid  4mg  PO scheduled Q4H and 2mg  IV PRN Q15min for pain/air hunger/comfort Robinul PRN for excessive secretions Ativan  PRN for agitation/anxiety Zofran  PRN for nausea Liquifilm tears PRN for dry eyes May have comfort feeding Comfort cart for family Unrestricted visitations in the setting of EOL (per policy) Oxygen PRN 2L or less for comfort. No escalation.   Patient and family defer hospice conversation until tomorrow Psychosocial and emotional support provided PMT will continue to follow and support   Prognosis:  < 2  weeks  Discharge Planning: Hospice facility  Care plan was discussed with patient, patient's brother, chaplain    MDM high         Azzam Mehra Alroy Jericho, PA-C  Palliative Medicine Team Team phone # [phone]  Thank you for allowing the Palliative Medicine Team to assist in the care of this patient. Please utilize secure chat with additional questions, if there is no response within 30 minutes please call the above phone number.  Palliative Medicine Team providers are available by phone from 7am to 7pm daily and can be reached through the team cell phone.  Should this patient require assistance outside of these hours, please call the patient's attending physician.

## 2023-07-21 DIAGNOSIS — M549 Dorsalgia, unspecified: Secondary | ICD-10-CM | POA: Diagnosis not present

## 2023-07-21 DIAGNOSIS — N179 Acute kidney failure, unspecified: Secondary | ICD-10-CM | POA: Diagnosis not present

## 2023-07-21 DIAGNOSIS — Z515 Encounter for palliative care: Secondary | ICD-10-CM | POA: Diagnosis not present

## 2023-07-21 DIAGNOSIS — N139 Obstructive and reflux uropathy, unspecified: Secondary | ICD-10-CM | POA: Diagnosis not present

## 2023-07-21 NOTE — TOC Initial Note (Signed)
 Transition of Care [facility]) - Initial/Assessment Note    Patient Details  Name: Danielle Mullins MRN: [id number] Date of Birth: September 07, 1960  Transition of Care [facility]) CM/SW Contact:    Juliane Och, LCSW Phone Number: 07/21/2023, 2:08 PM  Clinical Narrative:                  2:08 PM Per Adolm Ahumada of [facility], patient's brother, Cortland Ding, believes that patient would not be agreeable to going to a residential inpatient hospice facility and is expecting patient to have a hospital death. Medical team made aware.  Patient Goals and CMS Choice            Expected Discharge Plan and Services       Living arrangements for the past 2 months: Apartment                                      Prior Living Arrangements/Services Living arrangements for the past 2 months: Apartment Lives with:: Self Patient language and need for interpreter reviewed:: Yes        Need for Family Participation in Patient Care: Yes (Comment)     Criminal Activity/Legal Involvement Pertinent to Current Situation/Hospitalization: No - Comment as needed  Activities of Daily Living   ADL Screening (condition at time of admission) Independently performs ADLs?: Yes (appropriate for developmental age) Is the patient deaf or have difficulty hearing?: No Does the patient have difficulty seeing, even when wearing glasses/contacts?: No Does the patient have difficulty concentrating, remembering, or making decisions?: No  Permission Sought/Granted Permission sought to share information with : Family Supports Permission granted to share information with : No (Contact information on chart)  Share Information with NAME: Sharalyn Dasen     Permission granted to share info w Relationship: Brother  Permission granted to share info w Contact Information: [phone]  Emotional Assessment Appearance:: Appears stated age Attitude/Demeanor/Rapport: Unable to Assess Affect (typically observed): Unable to Assess    Alcohol / Substance Use: Not Applicable Psych Involvement: No (comment)  Admission diagnosis:  Hypokalemia [E87.6] Hypomagnesemia [E83.42] Neoplastic disease [D49.9] Urinary retention [R33.9] Drug-induced constipation [K59.03] Obstructive uropathy [N13.9] Acute renal failure, unspecified acute renal failure type [facility]) [N17.9] Patient Active Problem List   Diagnosis Date Noted   Obstructive uropathy 07/17/2023   Hypokalemia 07/17/2023   Back pain 07/12/2023   Other constipation 06/24/2023   Elevated serum creatinine 06/03/2023   Vaginal spotting 06/03/2023   Fall at home, initial encounter 04/25/2023   Vaginal lesion 04/15/2023   Pancytopenia, acquired (HCC) 04/01/2023   Microcytic anemia 03/11/2023   Goals of care, counseling/discussion 02/18/2023   Endometrial cancer (HCC) 01/20/2023   Fever 01/11/2023   Acute renal failure (HCC) 01/11/2023   Sepsis (HCC) 01/11/2023   Diabetes mellitus without complication (HCC)    Hypertension    PCP:  Adan Holms, FNP Pharmacy:   [id number] - [place], [place] - [address] [place] [address] Phone: [phone] Fax: [phone]  [facility] [address] [place] [address] Phone: [phone] Fax: [phone]  [id number] [place], [place] - [address] AT [facility] [address] [place] [address] Phone: [phone] Fax: [phone]     Social Drivers of Health (SDOH) Social History: SDOH Screenings   Food Insecurity: No Food Insecurity (07/17/2023)  Housing: Low  Risk  (07/17/2023)  Transportation Needs: No Transportation Needs (07/17/2023)  Utilities: Not At Risk (07/17/2023)  Depression (PHQ2-9): Low Risk  (02/28/2023)  Financial Resource Strain: High Risk (03/31/2023)  Tobacco Use: Low Risk  (07/17/2023)   SDOH Interventions:     Readmission Risk Interventions    07/19/2023     1:33 PM  Readmission Risk Prevention Plan  Transportation Screening Complete  PCP or Specialist Appt within 3-5 Days Complete  HRI or Home Care Consult Complete  Social Work Consult for Recovery Care Planning/Counseling Complete  Palliative Care Screening Complete  Medication Review [employer]) Complete

## 2023-07-21 NOTE — Progress Notes (Signed)
 PROGRESS NOTE  Danielle Mullins  HKV:[id number] DOB: 17-May-1960 DOA: 07/17/2023 PCP: Adan Holms, FNP  Consultants  Brief Narrative: 63 y.o. 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 Patient was recently noted to have rising creatinine from her baseline of less than 1 in April went to 1.39 then 3.7 then 3.31 just 3 days ago. In the ER it was 4.9. Workup shows urinary retention with patient having 800 cc in the bladder and Foley catheter was inserted. CT abdomen pelvis showed diffuse metastatic disease and pelvic abdominal masses with large pelvic mass. Patient suspected to have obstructive uropathy causing her AKI. Admitted to the hospital for further evaluation and treatment.  Initial plan was percutaneous nephrostomy tube per urology but patient seen by oncology AM of 6/16 and deemed likely terminal from cancer standpoint due to spread of disease.  Assessment & Plan: Advanced carcinoma:  - Patient continues expected decline. - No concerns at this time.   - Increasing apneic spells - Continue current comfort care measures  AKI:  Initial reason for admission.   Creatinine has been rising rapidly over the past several days from 3-->6.4 before we stop checking. I did discuss with family that rate of rise would likely lead to uremia sooner rather than later and that she likely only has a few days left, although we are of course unsure exact timing. Remains unarousable on my exam this afternoon   Diabetes:  Liberalize diet   Pancytopenia Secondary to cancer.  Comfort care measures  Constipation: - Continue laxatives as tolerated.  Liberalize pain regimen and continue to watch for any worsening constipation     DVT prophylaxis:  Comfort care  Code Status:   Code  Status: Do not attempt resuscitation (DNR) - Comfort care Family Communication: Niece and brother present at bedside.  All questions answered Level of care: Med-Surg Status is: Inpatient Dispo: Comfort care measures, now likely imminent hospital death, which is also in keeping with family's updated wishes   Consults called: Urology, oncology, palliative care  Subjective: Patient sleeping on my exam this afternoon.  Long apneic spells between breaths.  Pulse still regular.  Unable to arouse  Objective: Vitals:   07/19/23 0925 07/19/23 2130 07/20/23 0447 07/21/23 0834  BP:  121/63 124/64 130/74  Pulse:  100 (!) 103 (!) 105  Resp:  18 18 19   Temp:  97.8 F (36.6 C) 97.7 F (36.5 C) (!) 97.4 F (36.3 C)  TempSrc:      SpO2: 100% 98% 100% 100%  Weight:      Height:        Intake/Output Summary (Last 24 hours) at 07/21/2023 1413 Last data filed at 07/21/2023 0854 Gross per 24 hour  Intake 200 ml  Output 100 ml  Net 100 ml   Filed Weights   07/17/23 1425  Weight: 77 kg   Body mass index is 27.4 kg/m.  Gen: 63 y.o. female in no apparent distress.  Nontoxic Pulm:Snoring, no adventitious breath sounds.  Long apneic periods between breaths  CV: Regular rate and rhythm.  Skin: No rashes, lesions  Neuro: Alert and oriented. No focal neurological deficits. Psych: Calm  Judgement and insight appear normal. Mood & affect appropriately flat   I have personally reviewed the following labs and images: CBC: Recent Labs  Lab 07/17/23 1630  07/17/23 1634 07/17/23 2150 07/18/23 0426 07/19/23 0836  WBC 7.9  --  6.8 6.9 8.5  NEUTROABS 6.1  --   --   --   --   HGB 7.7* 7.5* 7.4* 7.5* 7.4*  HCT 24.2* 22.0* 22.6* 23.3* 23.9*  MCV 84.0  --  82.2 84.1 87.2  PLT 205  --  178 190 180   BMP &GFR Recent Labs  Lab 07/17/23 1630 07/17/23 1634 07/17/23 2150 07/18/23 0426 07/19/23 0836  NA 142 144  --  141 142  K 3.1* 3.2*  --  3.2* 3.8  CL 114* 115*  --  114* 114*  CO2 14*  --    --  14* 11*  GLUCOSE 124* 123*  --  137* 149*  BUN 69* 67*  --  69* 77*  CREATININE 4.90* 5.10* 4.89* 5.13* 6.42*  CALCIUM 9.0  --   --  8.2* 8.8*  MG 1.2*  --   --   --   --   PHOS 5.4*  --   --   --   --    Estimated Creatinine Clearance: 9.4 mL/min (A) (by C-G formula based on SCr of 6.42 mg/dL (H)). Liver & Pancreas: Recent Labs  Lab 07/17/23 1630 07/18/23 0426  AST 12* 12*  ALT 9 9  ALKPHOS 45 47  BILITOT 0.5 0.5  PROT 6.8 6.1*  ALBUMIN 2.6* 2.3*   Recent Labs  Lab 07/17/23 1630  LIPASE 24   No results for input(s): AMMONIA in the last 168 hours. Diabetic: No results for input(s): HGBA1C in the last 72 hours.  Recent Labs  Lab 07/18/23 1647 07/18/23 2211 07/19/23 0628 07/19/23 0814 07/19/23 2132  GLUCAP 109* 121* 125* 138* 148*   Cardiac Enzymes: No results for input(s): CKTOTAL, CKMB, CKMBINDEX, TROPONINI in the last 168 hours. No results for input(s): PROBNP in the last 8760 hours. Coagulation Profile: No results for input(s): INR, PROTIME in the last 168 hours. Thyroid  Function Tests: No results for input(s): TSH, T4TOTAL, FREET4, T3FREE, THYROIDAB in the last 72 hours. Lipid Profile: No results for input(s): CHOL, HDL, LDLCALC, TRIG, CHOLHDL, LDLDIRECT in the last 72 hours. Anemia Panel: No results for input(s): VITAMINB12, FOLATE, FERRITIN, TIBC, IRON , RETICCTPCT in the last 72 hours. Urine analysis:    Component Value Date/Time   COLORURINE YELLOW 07/17/2023 1635   APPEARANCEUR CLEAR 07/17/2023 1635   LABSPEC 1.014 07/17/2023 1635   PHURINE 5.0 07/17/2023 1635   GLUCOSEU NEGATIVE 07/17/2023 1635   HGBUR NEGATIVE 07/17/2023 1635   BILIRUBINUR NEGATIVE 07/17/2023 1635   KETONESUR NEGATIVE 07/17/2023 1635   PROTEINUR NEGATIVE 07/17/2023 1635   NITRITE NEGATIVE 07/17/2023 1635   LEUKOCYTESUR NEGATIVE 07/17/2023 1635   Sepsis Labs: Invalid input(s): PROCALCITONIN,  LACTICIDVEN  Microbiology: No results found for this or any previous visit (from the past 240 hours).  Radiology Studies: No results found.   Scheduled Meds:  Chlorhexidine  Gluconate Cloth  6 each Topical Daily   feeding supplement  237 mL Oral BID BM   HYDROmorphone   4 mg Oral Q4H   polyethylene glycol  17 g Oral BID   Continuous Infusions:     LOS: 4 days   35 minutes with more than 50% spent in reviewing records, counseling patient/family and coordinating care.  Trenton Frock, MD [facility] [email or web address] 07/21/2023, 2:13 PM

## 2023-07-21 NOTE — Progress Notes (Signed)
 Daily Progress Note   Patient Name: Danielle Mullins       Date: 07/21/2023 DOB: 17-Jul-1960  Age: 63 y.o. MRN#: [id number] Attending Physician: Trenton Frock, MD Primary Care Physician: Adan Holms, FNP Admit Date: 07/17/2023  Reason for Consultation/Follow-up: Establishing goals of care  Subjective: Medical records reviewed including progress notes, labs and imaging, MAR. Patient received 2 doses of PRN IV Dilaudid  in addition to scheduled Dilaudid . Patient assessed at the bedside. She denies pain or distress and appears comfortable. She states I don't know when asked about her thoughts and feelings and tells me her brother, sister-in-law and 2 nieces are currently visiting.  Family is still attempting to do financial and medical POA with a notary visiting around 1030 this morning. They have had experience in the past with the difficulties of a loved one dying without the durable POA and will completed, hoping to avoid this again. They remain interested in speaking with hospice liaison after this is accomplished. I reviewed hospice facility and provided reassurance that her care plan would look much like it does now, with further adjustments if her symptoms worsen. She nodded her understanding. No other concerns at this time.  Questions and concerns addressed. PMT will continue to support holistically.   Length of Stay: 4   Physical Exam Vitals and nursing note reviewed.  Constitutional:      General: She is not in acute distress.    Appearance: She is ill-appearing.  HENT:     Head: Normocephalic and atraumatic.   Cardiovascular:     Rate and Rhythm: Normal rate.   Skin:    General: Skin is warm and dry.   Neurological:     Mental Status: She is alert.   Psychiatric:        Behavior: Behavior is slowed.              Vital Signs: BP 130/74 (BP Location: Left Arm)   Pulse (!) 105   Temp (!) 97.4 F (36.3 C)   Resp 19   Ht 5' 6 (1.676 m)   Wt 77 kg   SpO2 100%   BMI 27.40 kg/m  SpO2: SpO2: 100 % O2 Device: O2 Device: Room Air O2 Flow Rate: O2 Flow Rate (L/min): 0 L/min      Palliative Care Assessment & Plan   Patient Profile: 63 y.o. 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    Patient recently completed chemotherapy and also had complications with side effects of chemotherapy, including an allergic reaction and severe anemia. Her cancer has progressed despite this; CT showed multiple new pelvic masses, LUQ and mid left abdomen metastases. In the ED, workup also showed urinary retention and abdominal pain was relieved by Foley catheter insertion. Urology was consulted. She is admitted for AKI due to obstructive uropathy. Cr increased to 5.13 today from 4.89 yesterday.   PMT has been consulted to assist with pain management and goals of care conversation. She faces complex medical decision making and anticipatory care needs.  Assessment: End of life care  Recommendations/Plan: Continue DNR/DNI  Continue comfort focused care, no adjustments required Dilaudid  4mg  PO scheduled Q4H and 2mg  IV PRN Q15min for pain/air hunger/comfort Robinul PRN for excessive secretions Ativan  PRN for agitation/anxiety Zofran  PRN for nausea Liquifilm tears PRN for dry eyes May have comfort feeding Comfort cart for family Unrestricted visitations in the setting of EOL (per policy) Oxygen PRN 2L or less for comfort. No escalation.   Patient and family defer hospice conversation until this afternoon after durable and medical POA, financial and living will, can be finalized  Psychosocial and emotional support provided PMT will continue to follow and  support   Prognosis:  < 2 weeks  Discharge Planning: Hospice facility  Care plan was discussed with patient, patient's brother, chaplain    MDM high         Chereese Cilento Alroy Jericho, PA-C  Palliative Medicine Team Team phone # [phone]  Thank you for allowing the Palliative Medicine Team to assist in the care of this patient. Please utilize secure chat with additional questions, if there is no response within 30 minutes please call the above phone number.  Palliative Medicine Team providers are available by phone from 7am to 7pm daily and can be reached through the team cell phone.  Should this patient require assistance outside of these hours, please call the patient's attending physician.

## 2023-07-21 NOTE — Progress Notes (Signed)
 Danielle Mullins 2W19 [facility] Liaison Note   Received request for family interest in [facility]. Contacted brother, Cortland Ding by phone yesterday and today to provide information and coordinate appropriate hospice services if desired.   Discussion today in summary, Cortland Ding feels that [name] would not be agreeable to go to an inpatient hospice facility as it would be considered, giving up. At this time he feels that she will likely have a hospital death.   Inpatient team updated with this information. Please contact us  if patient desires hospice services in home, facility, or if patient/family wishes to know more/ be evaluated for inpatient hospice care.   Please do not hesitate to call with questions.   Madelene Schanz, BSN, RN, [facility] Liaison [phone]

## 2023-07-22 ENCOUNTER — Other Ambulatory Visit

## 2023-07-22 ENCOUNTER — Encounter

## 2023-07-22 ENCOUNTER — Ambulatory Visit: Admitting: Hematology and Oncology

## 2023-07-22 ENCOUNTER — Encounter: Admitting: Nutrition

## 2023-07-22 DIAGNOSIS — Z515 Encounter for palliative care: Secondary | ICD-10-CM | POA: Diagnosis not present

## 2023-07-22 DIAGNOSIS — C541 Malignant neoplasm of endometrium: Secondary | ICD-10-CM | POA: Diagnosis not present

## 2023-07-22 DIAGNOSIS — N179 Acute kidney failure, unspecified: Secondary | ICD-10-CM | POA: Diagnosis not present

## 2023-07-22 DIAGNOSIS — N139 Obstructive and reflux uropathy, unspecified: Secondary | ICD-10-CM | POA: Diagnosis not present

## 2023-07-22 MED ORDER — HYDROMORPHONE HCL 1 MG/ML IJ SOLN
1.0000 mg | INTRAMUSCULAR | Status: DC
  Administered 2023-07-22 – 2023-07-27 (×24): 1 mg via INTRAVENOUS
  Filled 2023-07-22 (×25): qty 1

## 2023-07-22 NOTE — Progress Notes (Signed)
 PROGRESS NOTE  Danielle Mullins  ZOX:[id number] DOB: [birth year] DOA: 07/17/2023 PCP: Adan Holms, FNP  Consultants  Brief Narrative: 63 y.o. 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 Initial plan was percutaneous nephrostomy tube per urology but patient seen by oncology AM of 6/16 and deemed likely terminal from cancer standpoint due to spread of disease.  Currently patient alert does not appear to be in imminent hospital death but family is also refusing residential hospice.  Assessment & Plan: Advanced carcinoma:  - Continue current comfort care measures  AKI:  Initial reason for admission.   -Comfort care  Diabetes:  Liberalize diet   Pancytopenia -Comfort care measures  Constipation: - Continue laxatives as tolerated.  Liberalize pain regimen and continue to watch for any worsening constipation     DVT prophylaxis:  Comfort care  Code Status:   Code Status: Do not attempt resuscitation (DNR) - Comfort care  Level of care: Med-Surg Status is: Inpatient Dispo: Comfort care measures-family does not want to transition to residential hospice   Consults called: Urology, oncology, palliative care  Subjective: Patient awake and watching TV this a.m. is withdrawn and slow to answer questions  Objective: Vitals:   07/20/23 0447 07/21/23 0834 07/21/23 2041 07/22/23 0505  BP: 124/64 130/74 120/62 107/61  Pulse: (!) 103 (!) 105 96 96  Resp: 18 19 19 19   Temp: 97.7 F (36.5 C) (!) 97.4 F (36.3 C) 97.7 F (36.5 C) (!) 97.5 F (36.4 C)  TempSrc:      SpO2: 100% 100% 100% 100%  Weight:      Height:        Intake/Output Summary (Last 24 hours) at 07/22/2023 0825 Last data filed at 07/21/2023 1851 Gross per 24 hour  Intake 500 ml  Output 100 ml  Net  400 ml   Filed Weights   07/17/23 1425  Weight: 77 kg   Body mass index is 27.4 kg/m.  General: Appearance:     Overweight female in no acute distress     Lungs:     respirations unlabored  Heart:    Normal heart rate.    MS:   All extremities are intact.   Neurologic:   Awake, alert      I have personally reviewed the following labs and images: CBC: Recent Labs  Lab 07/17/23 1630 07/17/23 1634 07/17/23 2150 07/18/23 0426 07/19/23 0836  WBC 7.9  --  6.8 6.9 8.5  NEUTROABS 6.1  --   --   --   --   HGB 7.7* 7.5* 7.4* 7.5* 7.4*  HCT 24.2* 22.0* 22.6* 23.3* 23.9*  MCV 84.0  --  82.2 84.1 87.2  PLT 205  --  178 190 180   BMP &GFR Recent Labs  Lab 07/17/23 1630 07/17/23 1634 07/17/23 2150 07/18/23 0426 07/19/23 0836  NA 142 144  --  141 142  K 3.1* 3.2*  --  3.2* 3.8  CL 114* 115*  --  114* 114*  CO2 14*  --   --  14* 11*  GLUCOSE 124* 123*  --  137* 149*  BUN 69* 67*  --  69* 77*  CREATININE 4.90* 5.10* 4.89* 5.13* 6.42*  CALCIUM 9.0  --   --  8.2* 8.8*  MG 1.2*  --   --   --   --  PHOS 5.4*  --   --   --   --    Estimated Creatinine Clearance: 9.4 mL/min (A) (by C-G formula based on SCr of 6.42 mg/dL (H)). Liver & Pancreas: Recent Labs  Lab 07/17/23 1630 07/18/23 0426  AST 12* 12*  ALT 9 9  ALKPHOS 45 47  BILITOT 0.5 0.5  PROT 6.8 6.1*  ALBUMIN 2.6* 2.3*   Recent Labs  Lab 07/17/23 1630  LIPASE 24   No results for input(s): AMMONIA in the last 168 hours. Diabetic: No results for input(s): HGBA1C in the last 72 hours.  Recent Labs  Lab 07/18/23 1647 07/18/23 2211 07/19/23 0628 07/19/23 0814 07/19/23 2132  GLUCAP 109* 121* 125* 138* 148*   Cardiac Enzymes: No results for input(s): CKTOTAL, CKMB, CKMBINDEX, TROPONINI in the last 168 hours. No results for input(s): PROBNP in the last 8760 hours. Coagulation Profile: No results for input(s): INR, PROTIME in the last 168 hours. Thyroid  Function Tests: No results for  input(s): TSH, T4TOTAL, FREET4, T3FREE, THYROIDAB in the last 72 hours. Lipid Profile: No results for input(s): CHOL, HDL, LDLCALC, TRIG, CHOLHDL, LDLDIRECT in the last 72 hours. Anemia Panel: No results for input(s): VITAMINB12, FOLATE, FERRITIN, TIBC, IRON , RETICCTPCT in the last 72 hours. Urine analysis:    Component Value Date/Time   COLORURINE YELLOW 07/17/2023 1635   APPEARANCEUR CLEAR 07/17/2023 1635   LABSPEC 1.014 07/17/2023 1635   PHURINE 5.0 07/17/2023 1635   GLUCOSEU NEGATIVE 07/17/2023 1635   HGBUR NEGATIVE 07/17/2023 1635   BILIRUBINUR NEGATIVE 07/17/2023 1635   KETONESUR NEGATIVE 07/17/2023 1635   PROTEINUR NEGATIVE 07/17/2023 1635   NITRITE NEGATIVE 07/17/2023 1635   LEUKOCYTESUR NEGATIVE 07/17/2023 1635   Sepsis Labs: Invalid input(s): PROCALCITONIN, LACTICIDVEN  Microbiology: No results found for this or any previous visit (from the past 240 hours).  Radiology Studies: No results found.   Scheduled Meds:  Chlorhexidine  Gluconate Cloth  6 each Topical Daily   feeding supplement  237 mL Oral BID BM   HYDROmorphone   4 mg Oral Q4H   polyethylene glycol  17 g Oral BID   Continuous Infusions:     LOS: 5 days   35 minutes with more than 50% spent in reviewing records, counseling patient/family and coordinating care.  Enrigue Harvard, DO [facility] [email or web address] 07/22/2023, 8:25 AM

## 2023-07-22 NOTE — Progress Notes (Addendum)
 Daily Progress Note   Patient Name: Danielle Mullins       Date: 07/22/2023 DOB: 08-Dec-1960  Age: 63 y.o. MRN#: [id number] Attending Physician: Vann, Jessica U, DO Primary Care Physician: Adan Holms, FNP Admit Date: 07/17/2023  Reason for Consultation/Follow-up: Establishing goals of care  Subjective: Medical records reviewed including progress notes, labs and imaging, MAR.  At the time of seeing patient this morning she had not received any pain medication since 9 PM the night before.  Documentation reveals that p.o. doses of Dilaudid  were held due to concerns of about her swallowing; IV Dilaudid  was not given either.  This morning patient is complaining of lots of pain and exhibiting nonverbal signs of pain.  Discussed with dayshift RN who was obtaining Dilaudid  for patient.  She shares about night shift concerns of difficulty swallowing p.o. pills and so we discussed plan to change scheduled Dilaudid  to IV.  Patient is unable to participate in further discussion regarding her care.  No family at bedside.  During interaction with patient she gets out of bed, assisted her in getting more comfortable in bed and changing pad.   Questions and concerns addressed. PMT will continue to support holistically.   Length of Stay: 5   Physical Exam Vitals and nursing note reviewed.  Constitutional:      General: She is not in acute distress.    Appearance: She is ill-appearing.  HENT:     Head: Normocephalic and atraumatic.   Cardiovascular:     Rate and Rhythm: Normal rate.   Skin:    General: Skin is warm and dry.   Neurological:     Mental Status: She is alert.   Psychiatric:        Behavior: Behavior is slowed.             Vital Signs: BP 107/61 (BP Location: Left Arm)   Pulse 96   Temp (!) 97.5 F (36.4 C)   Resp 19    Ht 5' 6 (1.676 m)   Wt 77 kg   SpO2 100%   BMI 27.40 kg/m  SpO2: SpO2: 100 % O2 Device: O2 Device: Room Air O2 Flow Rate: O2 Flow Rate (L/min): 0 L/min      Palliative Care Assessment & Plan   Patient Profile: 63 y.o. 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    Patient recently completed chemotherapy and also had complications with side effects of chemotherapy, including an allergic reaction and severe anemia. Her cancer has progressed despite this; CT showed multiple new pelvic masses, LUQ and mid left abdomen metastases. In the ED, workup also showed urinary retention and abdominal pain was relieved by Foley catheter insertion. Urology was consulted. She is admitted for AKI due to obstructive uropathy. Cr increased to 5.13 today from 4.89 yesterday.   PMT has been consulted to assist with pain management and goals of care conversation. She faces complex medical decision making and anticipatory care needs.  Assessment: End of life care  Recommendations/Plan: Continue DNR/DNI Continue comfort focused care, no adjustments required Dilaudid  1mg  IV  scheduled Q4H and 2mg  IV PRN Q15min for pain/air hunger/comfort Robinul PRN for excessive secretions Ativan  PRN for agitation/anxiety Zofran  PRN for nausea Liquifilm tears PRN for dry eyes May have comfort feeding Comfort cart for family Unrestricted visitations in the setting of EOL (per policy) Oxygen PRN 2L or less for comfort. No escalation.   Family has declined hospice facility Psychosocial and emotional support provided PMT will continue to follow and support   Prognosis:  < 2 weeks  Discharge Planning: Hospice facility  Care plan was discussed with patient and RN  40 minutes         Pati Thinnes Adeline Hone, NP  Palliative Medicine Team Team phone #  [phone]  Thank you for allowing the Palliative Medicine Team to assist in the care of this patient. Please utilize secure chat with additional questions, if there is no response within 30 minutes please call the above phone number.  Palliative Medicine Team providers are available by phone from 7am to 7pm daily and can be reached through the team cell phone.  Should this patient require assistance outside of these hours, please call the patient's attending physician.

## 2023-07-23 DIAGNOSIS — Z515 Encounter for palliative care: Secondary | ICD-10-CM | POA: Diagnosis not present

## 2023-07-23 DIAGNOSIS — N139 Obstructive and reflux uropathy, unspecified: Secondary | ICD-10-CM | POA: Diagnosis not present

## 2023-07-23 DIAGNOSIS — Z7189 Other specified counseling: Secondary | ICD-10-CM | POA: Diagnosis not present

## 2023-07-23 NOTE — Progress Notes (Signed)
 PROGRESS NOTE  Danielle Mullins  FMW:[id number] DOB: 1960/09/01 DOA: 07/17/2023 PCP: Sebastian Jerilyn HERO, FNP  Consultants  Brief Narrative: 63 y.o. 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 Initial plan was percutaneous nephrostomy tube per urology but patient seen by oncology AM of 6/16 and deemed likely terminal from cancer standpoint due to spread of disease.  Currently patient alert does not appear to be in imminent hospital death but family is also refusing residential hospice.    Assessment & Plan: Advanced carcinoma:  - Continue current comfort care measures - Pain appears to be well controlled.  - in and out of responsiveness according to brother   AKI:  Initial reason for admission.   -Comfort care   Diabetes:  Liberalize diet   Pancytopenia -Comfort care measures   Constipation: - Continue laxatives as tolerated.  Liberalize pain regimen and continue to watch for any worsening constipation    DVT prophylaxis:    Code Status:   Code Status: Do not attempt resuscitation (DNR) - Comfort care Family Communication: Brother Lorrene at bedside.  All questions answered Level of care: Med-Surg Status is: Inpatient Dispo: Remains in house as patient has refused inpatient hospice facility  Consults called: Palliative care  Subjective: Patient lying in bed staring at wall.  Relatively unresponsive.  Did not report any pain when asked  Objective: Vitals:   07/21/23 0834 07/21/23 2041 07/22/23 0505 07/22/23 2034  BP: 130/74 120/62 107/61 (!) 114/57  Pulse: (!) 105 96 96 99  Resp: 19 19 19 18   Temp: (!) 97.4 F (36.3 C) 97.7 F (36.5 C) (!) 97.5 F (36.4 C) (!) 97.3 F (36.3 C)  TempSrc:    Axillary  SpO2: 100% 100% 100% 100%  Weight:      Height:        No intake or output data in the 24 hours ending 07/23/23 1354 Filed Weights   07/17/23 1425  Weight: 77 kg   Body mass index is 27.4 kg/m.  Gen: 63 y.o. female in no apparent distress.  Nontoxic Pulm: Non-labored breathing.  Clear to auscultation bilaterally.  CV: Regular rate and rhythm. No murmur, rub, or gallop. No JVD Ext: Warm, no deformities,  Skin: No rashes, lesions  Neuro: Awake.  Staring at television.  Relatively unresponsive when being asked questions   I have personally reviewed the following labs and images: CBC: Recent Labs  Lab 07/17/23 1630 07/17/23 1634 07/17/23 2150 07/18/23 0426 07/19/23 0836  WBC 7.9  --  6.8 6.9 8.5  NEUTROABS 6.1  --   --   --   --   HGB 7.7* 7.5* 7.4* 7.5* 7.4*  HCT 24.2* 22.0* 22.6* 23.3* 23.9*  MCV 84.0  --  82.2 84.1 87.2  PLT 205  --  178 190 180   BMP &GFR Recent Labs  Lab 07/17/23 1630 07/17/23 1634 07/17/23 2150 07/18/23 0426 07/19/23 0836  NA 142 144  --  141 142  K 3.1* 3.2*  --  3.2* 3.8  CL 114* 115*  --  114* 114*  CO2 14*  --   --  14* 11*  GLUCOSE 124* 123*  --  137* 149*  BUN 69* 67*  --  69* 77*  CREATININE 4.90* 5.10* 4.89* 5.13* 6.42*  CALCIUM 9.0  --   --  8.2*  8.8*  MG 1.2*  --   --   --   --   PHOS 5.4*  --   --   --   --    Estimated Creatinine Clearance: 9.4 mL/min (A) (by C-G formula based on SCr of 6.42 mg/dL (H)). Liver & Pancreas: Recent Labs  Lab 07/17/23 1630 07/18/23 0426  AST 12* 12*  ALT 9 9  ALKPHOS 45 47  BILITOT 0.5 0.5  PROT 6.8 6.1*  ALBUMIN 2.6* 2.3*   Recent Labs  Lab 07/17/23 1630  LIPASE 24   No results for input(s): AMMONIA in the last 168 hours. Diabetic: No results for input(s): HGBA1C in the last 72 hours. Recent Labs  Lab 07/18/23 1647 07/18/23 2211 07/19/23 0628 07/19/23 0814 07/19/23 2132  GLUCAP 109* 121* 125* 138* 148*   Cardiac Enzymes: No results for input(s): CKTOTAL, CKMB, CKMBINDEX, TROPONINI in the last 168 hours. No  results for input(s): PROBNP in the last 8760 hours. Coagulation Profile: No results for input(s): INR, PROTIME in the last 168 hours. Thyroid  Function Tests: No results for input(s): TSH, T4TOTAL, FREET4, T3FREE, THYROIDAB in the last 72 hours. Lipid Profile: No results for input(s): CHOL, HDL, LDLCALC, TRIG, CHOLHDL, LDLDIRECT in the last 72 hours. Anemia Panel: No results for input(s): VITAMINB12, FOLATE, FERRITIN, TIBC, IRON , RETICCTPCT in the last 72 hours. Urine analysis:    Component Value Date/Time   COLORURINE YELLOW 07/17/2023 1635   APPEARANCEUR CLEAR 07/17/2023 1635   LABSPEC 1.014 07/17/2023 1635   PHURINE 5.0 07/17/2023 1635   GLUCOSEU NEGATIVE 07/17/2023 1635   HGBUR NEGATIVE 07/17/2023 1635   BILIRUBINUR NEGATIVE 07/17/2023 1635   KETONESUR NEGATIVE 07/17/2023 1635   PROTEINUR NEGATIVE 07/17/2023 1635   NITRITE NEGATIVE 07/17/2023 1635   LEUKOCYTESUR NEGATIVE 07/17/2023 1635   Sepsis Labs: Invalid input(s): PROCALCITONIN, LACTICIDVEN  Microbiology: No results found for this or any previous visit (from the past 240 hours).  Radiology Studies: No results found.  Scheduled Meds:  Chlorhexidine  Gluconate Cloth  6 each Topical Daily   feeding supplement  237 mL Oral BID BM    HYDROmorphone  (DILAUDID ) injection  1 mg Intravenous Q4H   polyethylene glycol  17 g Oral BID   Continuous Infusions:   LOS: 6 days   35 minutes with more than 50% spent in reviewing records, counseling patient/family and coordinating care.  Reyes VEAR Gaw, MD [facility] [email or web address] 07/23/2023, 1:54 PM

## 2023-07-23 NOTE — Progress Notes (Signed)
 Daily Progress Note   Patient Name: Danielle Mullins       Date: 07/23/2023 DOB: [birth year]  Age: 63 y.o. MRN#: [id number] Attending Physician: Elpidio Reyes DEL, MD Primary Care Physician: Sebastian Jerilyn HERO, FNP Admit Date: 07/17/2023  Reason for Consultation/Follow-up: Establishing goals of care  Length of Stay: 6  Current Medications: Scheduled Meds:   Chlorhexidine  Gluconate Cloth  6 each Topical Daily   feeding supplement  237 mL Oral BID BM    HYDROmorphone  (DILAUDID ) injection  1 mg Intravenous Q4H   polyethylene glycol  17 g Oral BID    Continuous Infusions:   PRN Meds: acetaminophen  **OR** acetaminophen , antiseptic oral rinse, artificial tears, gabapentin , glycopyrrolate, HYDROmorphone  (DILAUDID ) injection, LORazepam  **OR** LORazepam  **OR** LORazepam , ondansetron  **OR** ondansetron  (ZOFRAN ) IV  Physical Exam Constitutional:      General: She is not in acute distress.    Appearance: She is ill-appearing.  HENT:     Head: Normocephalic and atraumatic.  Pulmonary:     Effort: Pulmonary effort is normal.   Skin:    General: Skin is warm and dry.   Neurological:     Mental Status: She is alert.   Psychiatric:        Behavior: Behavior is slowed.             Vital Signs: BP (!) 114/57 (BP Location: Left Arm)   Pulse 99   Temp (!) 97.3 F (36.3 C) (Axillary)   Resp 18   Ht 5' 6 (1.676 m)   Wt 77 kg   SpO2 100%   BMI 27.40 kg/m  SpO2: SpO2: 100 % O2 Device: O2 Device: Room Air O2 Flow Rate: O2 Flow Rate (L/min): 0 L/min        Patient Active Problem List   Diagnosis Date Noted   Obstructive uropathy 07/17/2023   Hypokalemia 07/17/2023   Back pain 07/12/2023   Other constipation 06/24/2023   Elevated serum creatinine 06/03/2023   Vaginal spotting  06/03/2023   Fall at home, initial encounter 04/25/2023   Vaginal lesion 04/15/2023   Pancytopenia, acquired (HCC) 04/01/2023   Microcytic anemia 03/11/2023   Goals of care, counseling/discussion 02/18/2023   Endometrial cancer (HCC) 01/20/2023   Fever 01/11/2023   Acute renal failure (HCC) 01/11/2023   Sepsis (HCC) 01/11/2023   Diabetes mellitus without complication (  HCC)    Hypertension     Palliative Care Assessment & Plan   Patient Profile: 63 y.o. 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    Patient recently completed chemotherapy and also had complications with side effects of chemotherapy, including an allergic reaction and severe anemia. Her cancer has progressed despite this; CT showed multiple new pelvic masses, LUQ and mid left abdomen metastases. In the ED, workup also showed urinary retention and abdominal pain was relieved by Foley catheter insertion. Urology was consulted. She is admitted for AKI due to obstructive uropathy. Cr increased to 5.13 today from 4.89 yesterday.   PMT has been consulted to assist with pain management and goals of care conversation. She faces complex medical decision making and anticipatory care needs.  Today's Discussion: Chart reviewed. Patient is on scheduled dilaudid  but did not receive her 0400 dose due to being asleep. When I entered the room the patient was moaning and yelping. When I asked her if she was in pain she nodded yes. I shared with patient that she had not received a dose of her pain medication in some time but we would work now to get her pain resolved. Patient nodded okay. Requested dose from day shift nurse. No family at bedside.  PMT will continue to support.  Recommendations/Plan: Continue DNR/DNI Continue comfort focused care, no adjustments required Dilaudid  1mg  IV  scheduled Q4H and 2mg  IV PRN Q15min for pain/air hunger/comfort Robinul PRN for excessive secretions Ativan  PRN for agitation/anxiety Zofran  PRN for nausea Liquifilm tears PRN for dry eyes May have comfort feeding Comfort cart for family Unrestricted visitations in the setting of EOL (per policy) Oxygen PRN 2L or less for comfort. No escalation.   Family has declined hospice facility Psychosocial and emotional support provided PMT will continue to follow and support  Goals of Care and Additional Recommendations: Limitations on Scope of Treatment: Full Comfort Care  Code Status:    Code Status Orders  (From admission, onward)           Start     Ordered   07/19/23 0925  Do not attempt resuscitation (DNR) - Comfort care  Continuous       Question Answer Comment  If patient has no pulse and is not breathing Do Not Attempt Resuscitation   In Pre-Arrest Conditions (Patient Is Breathing and Has a Pulse) Provide comfort measures. Relieve any mechanical airway obstruction. Avoid transfer unless required for comfort.   Consent: Discussion documented in EHR or advanced directives reviewed      07/19/23 0929        Extensive chart review has been completed prior to seeing the patient including progress/consult notes, orders, medications, and available advance directive documents.  Prognosis:  < 2 weeks  Discharge Planning: Anticipated Hospital Death  Care plan was discussed with bedside RN  Time spent: 25 minutes  Thank you for allowing the Palliative Medicine Team to assist in the care of this patient.   Stephane CHRISTELLA Palin, NP  Please contact Palliative Medicine Team phone at [phone] for questions and concerns.

## 2023-07-23 NOTE — Plan of Care (Signed)
  Problem: Education: Goal: Knowledge of General Education information will improve Description: Including pain rating scale, medication(s)/side effects and non-pharmacologic comfort measures Outcome: Not Progressing   Problem: Health Behavior/Discharge Planning: Goal: Ability to manage health-related needs will improve Outcome: Not Progressing   Problem: Clinical Measurements: Goal: Ability to maintain clinical measurements within normal limits will improve Outcome: Not Progressing Goal: Will remain free from infection Outcome: Not Progressing Goal: Diagnostic test results will improve Outcome: Not Progressing Goal: Respiratory complications will improve Outcome: Not Progressing Goal: Cardiovascular complication will be avoided Outcome: Not Progressing   Problem: Activity: Goal: Risk for activity intolerance will decrease Outcome: Not Progressing   Problem: Nutrition: Goal: Adequate nutrition will be maintained Outcome: Not Progressing   Problem: Coping: Goal: Level of anxiety will decrease Outcome: Not Progressing   Problem: Elimination: Goal: Will not experience complications related to bowel motility Outcome: Not Progressing Goal: Will not experience complications related to urinary retention Outcome: Not Progressing   Problem: Pain Managment: Goal: General experience of comfort will improve and/or be controlled Outcome: Not Progressing   Problem: Safety: Goal: Ability to remain free from injury will improve Outcome: Not Progressing   Problem: Skin Integrity: Goal: Risk for impaired skin integrity will decrease Outcome: Not Progressing   Problem: Education: Goal: Ability to describe self-care measures that may prevent or decrease complications (Diabetes Survival Skills Education) will improve Outcome: Not Progressing Goal: Individualized Educational Video(s) Outcome: Not Progressing   Problem: Coping: Goal: Ability to adjust to condition or change in  health will improve Outcome: Not Progressing   Problem: Fluid Volume: Goal: Ability to maintain a balanced intake and output will improve Outcome: Not Progressing   Problem: Health Behavior/Discharge Planning: Goal: Ability to identify and utilize available resources and services will improve Outcome: Not Progressing Goal: Ability to manage health-related needs will improve Outcome: Not Progressing   Problem: Metabolic: Goal: Ability to maintain appropriate glucose levels will improve Outcome: Not Progressing   Problem: Nutritional: Goal: Maintenance of adequate nutrition will improve Outcome: Not Progressing Goal: Progress toward achieving an optimal weight will improve Outcome: Not Progressing   Problem: Skin Integrity: Goal: Risk for impaired skin integrity will decrease Outcome: Not Progressing   Problem: Tissue Perfusion: Goal: Adequacy of tissue perfusion will improve Outcome: Not Progressing   Problem: Education: Goal: Knowledge of the prescribed therapeutic regimen will improve Outcome: Not Progressing   Problem: Coping: Goal: Ability to identify and develop effective coping behavior will improve Outcome: Not Progressing   Problem: Clinical Measurements: Goal: Quality of life will improve Outcome: Not Progressing   Problem: Respiratory: Goal: Verbalizations of increased ease of respirations will increase Outcome: Not Progressing   Problem: Role Relationship: Goal: Family's ability to cope with current situation will improve Outcome: Not Progressing Goal: Ability to verbalize concerns, feelings, and thoughts to partner or family member will improve Outcome: Not Progressing   Problem: Pain Management: Goal: Satisfaction with pain management regimen will improve Outcome: Not Progressing

## 2023-07-24 DIAGNOSIS — Z7189 Other specified counseling: Secondary | ICD-10-CM | POA: Diagnosis not present

## 2023-07-24 DIAGNOSIS — N139 Obstructive and reflux uropathy, unspecified: Secondary | ICD-10-CM | POA: Diagnosis not present

## 2023-07-24 MED ORDER — GERHARDT'S BUTT CREAM
TOPICAL_CREAM | Freq: Four times a day (QID) | CUTANEOUS | Status: DC | PRN
  Filled 2023-07-24: qty 60

## 2023-07-24 NOTE — Plan of Care (Signed)

## 2023-07-24 NOTE — Progress Notes (Signed)
 PROGRESS NOTE  Danielle Mullins  FMW:[id number] DOB: 1960/10/04 DOA: 07/17/2023 PCP: Sebastian Jerilyn HERO, FNP  Consultants  Brief Narrative: 63 y.o. 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 Initial plan was percutaneous nephrostomy tube per urology but patient seen by oncology AM of 6/16 and deemed likely terminal from cancer standpoint due to spread of disease.  Currently patient alert does not appear to be in imminent hospital death but family is also refusing residential hospice.    Assessment & Plan: Advanced carcinoma:  - Continue current comfort care measures - Pain appears to be well controlled.  I saw patient after she was seen by palliative care and had received pain medication at that time.  Much more comfortable on my exam. - in and out of responsiveness according to brother   AKI:  Initial reason for admission.   -Comfort care   Diabetes:  Liberalize diet   Pancytopenia -Comfort care measures   Constipation: - Continue laxatives as tolerated.  Liberalize pain regimen and continue to watch for any worsening constipation   Code Status:   Code Status: Do not attempt resuscitation (DNR) - Comfort care Family Communication: Brother Lorrene at bedside.  All questions answered Level of care: Med-Surg Status is: Inpatient Dispo: Remains in house as patient has refused inpatient hospice facility  Consults called: Palliative care  Subjective: Patient sleeping soundly and nonresponsive when I saw her.  Palliative care had been by earlier in the morning and she had received a dose of pain medicine prior to my visit.  Objective: Vitals:   07/22/23 0505 07/22/23 2034 07/23/23 2038 07/24/23 0832  BP: 107/61 (!) 114/57 (!) 91/53 (!) 101/52  Pulse: 96 99 100 97   Resp: 19 18 20 19   Temp: (!) 97.5 F (36.4 C) (!) 97.3 F (36.3 C) 97.6 F (36.4 C) 97.6 F (36.4 C)  TempSrc:  Axillary    SpO2: 100% 100% 96% 94%  Weight:      Height:       No intake or output data in the 24 hours ending 07/24/23 1429 Filed Weights   07/17/23 1425  Weight: 77 kg   Body mass index is 27.4 kg/m.  Gen: 63 y.o. female in no apparent distress.  Nontoxic Pulm: Non-labored breathing.  Clear to auscultation bilaterally.  CV: Regular rate and rhythm. No murmur, rub, or gallop. No JVD Ext: Warm, no deformities,  Skin: No rashes, lesions  Neuro: Not awake, not responsive   I have personally reviewed the following labs and images: CBC: Recent Labs  Lab 07/17/23 1630 07/17/23 1634 07/17/23 2150 07/18/23 0426 07/19/23 0836  WBC 7.9  --  6.8 6.9 8.5  NEUTROABS 6.1  --   --   --   --   HGB 7.7* 7.5* 7.4* 7.5* 7.4*  HCT 24.2* 22.0* 22.6* 23.3* 23.9*  MCV 84.0  --  82.2 84.1 87.2  PLT 205  --  178 190 180   BMP &GFR Recent Labs  Lab 07/17/23 1630 07/17/23 1634 07/17/23 2150 07/18/23 0426 07/19/23 0836  NA 142 144  --  141 142  K 3.1* 3.2*  --  3.2* 3.8  CL 114* 115*  --  114* 114*  CO2 14*  --   --  14* 11*  GLUCOSE 124* 123*  --  137* 149*  BUN 69* 67*  --  69* 77*  CREATININE 4.90* 5.10* 4.89* 5.13* 6.42*  CALCIUM 9.0  --   --  8.2* 8.8*  MG 1.2*  --   --   --   --   PHOS 5.4*  --   --   --   --    Estimated Creatinine Clearance: 9.4 mL/min (A) (by C-G formula based on SCr of 6.42 mg/dL (H)). Liver & Pancreas: Recent Labs  Lab 07/17/23 1630 07/18/23 0426  AST 12* 12*  ALT 9 9  ALKPHOS 45 47  BILITOT 0.5 0.5  PROT 6.8 6.1*  ALBUMIN 2.6* 2.3*   Recent Labs  Lab 07/17/23 1630  LIPASE 24   No results for input(s): AMMONIA in the last 168 hours. Diabetic: No results for input(s): HGBA1C in the last 72 hours. Recent Labs  Lab 07/18/23 1647 07/18/23 2211 07/19/23 0628 07/19/23 0814 07/19/23 2132  GLUCAP 109* 121* 125* 138*  148*   Cardiac Enzymes: No results for input(s): CKTOTAL, CKMB, CKMBINDEX, TROPONINI in the last 168 hours. No results for input(s): PROBNP in the last 8760 hours. Coagulation Profile: No results for input(s): INR, PROTIME in the last 168 hours. Thyroid  Function Tests: No results for input(s): TSH, T4TOTAL, FREET4, T3FREE, THYROIDAB in the last 72 hours. Lipid Profile: No results for input(s): CHOL, HDL, LDLCALC, TRIG, CHOLHDL, LDLDIRECT in the last 72 hours. Anemia Panel: No results for input(s): VITAMINB12, FOLATE, FERRITIN, TIBC, IRON , RETICCTPCT in the last 72 hours. Urine analysis:    Component Value Date/Time   COLORURINE YELLOW 07/17/2023 1635   APPEARANCEUR CLEAR 07/17/2023 1635   LABSPEC 1.014 07/17/2023 1635   PHURINE 5.0 07/17/2023 1635   GLUCOSEU NEGATIVE 07/17/2023 1635   HGBUR NEGATIVE 07/17/2023 1635   BILIRUBINUR NEGATIVE 07/17/2023 1635   KETONESUR NEGATIVE 07/17/2023 1635   PROTEINUR NEGATIVE 07/17/2023 1635   NITRITE NEGATIVE 07/17/2023 1635   LEUKOCYTESUR NEGATIVE 07/17/2023 1635   Sepsis Labs: Invalid input(s): PROCALCITONIN, LACTICIDVEN  Microbiology: No results found for this or any previous visit (from the past 240 hours).  Radiology Studies: No results found.  Scheduled Meds:  Chlorhexidine  Gluconate Cloth  6 each Topical Daily   feeding supplement  237 mL Oral BID BM    HYDROmorphone  (DILAUDID ) injection  1 mg Intravenous Q4H   polyethylene glycol  17 g Oral BID   Continuous Infusions:   LOS: 7 days   35 minutes with more than 50% spent in reviewing records, counseling patient/family and coordinating care.  Reyes VEAR Gaw, MD [facility] [email or web address] 07/24/2023, 2:29 PM

## 2023-07-24 NOTE — Progress Notes (Signed)
 Daily Progress Note   Patient Name: Danielle Mullins       Date: 07/24/2023 DOB: December 03, 1960  Age: 63 y.o. MRN#: [id number] Attending Physician: Elpidio Reyes DEL, MD Primary Care Physician: Sebastian Jerilyn HERO, FNP Admit Date: 07/17/2023  Reason for Consultation/Follow-up: Establishing goals of care  Length of Stay: 7  Current Medications: Scheduled Meds:   Chlorhexidine  Gluconate Cloth  6 each Topical Daily   feeding supplement  237 mL Oral BID BM    HYDROmorphone  (DILAUDID ) injection  1 mg Intravenous Q4H   polyethylene glycol  17 g Oral BID    Continuous Infusions:   PRN Meds: acetaminophen  **OR** acetaminophen , antiseptic oral rinse, artificial tears, gabapentin , glycopyrrolate, HYDROmorphone  (DILAUDID ) injection, LORazepam  **OR** LORazepam  **OR** LORazepam , ondansetron  **OR** ondansetron  (ZOFRAN ) IV  Physical Exam Constitutional:      General: She is not in acute distress.    Appearance: She is ill-appearing.  HENT:     Head: Normocephalic and atraumatic.  Pulmonary:     Effort: Pulmonary effort is normal.   Skin:    General: Skin is warm and dry.   Neurological:     Mental Status: She is alert.   Psychiatric:        Behavior: Behavior is slowed.            Vital Signs: BP (!) 91/53 (BP Location: Left Arm)   Pulse 100   Temp 97.6 F (36.4 C)   Resp 20   Ht 5' 6 (1.676 m)   Wt 77 kg   SpO2 96%   BMI 27.40 kg/m  SpO2: SpO2: 96 % O2 Device: O2 Device: Room Air O2 Flow Rate: O2 Flow Rate (L/min): 0 L/min        Patient Active Problem List   Diagnosis Date Noted   Obstructive uropathy 07/17/2023   Hypokalemia 07/17/2023   Back pain 07/12/2023   Other constipation 06/24/2023   Elevated serum creatinine 06/03/2023   Vaginal spotting 06/03/2023   Fall  at home, initial encounter 04/25/2023   Vaginal lesion 04/15/2023   Pancytopenia, acquired (HCC) 04/01/2023   Microcytic anemia 03/11/2023   Goals of care, counseling/discussion 02/18/2023   Endometrial cancer (HCC) 01/20/2023   Fever 01/11/2023   Acute renal failure (HCC) 01/11/2023   Sepsis (HCC) 01/11/2023   Diabetes mellitus without complication (HCC)  Hypertension     Palliative Care Assessment & Plan   Patient Profile: 63 y.o. 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    Patient recently completed chemotherapy and also had complications with side effects of chemotherapy, including an allergic reaction and severe anemia. Her cancer has progressed despite this; CT showed multiple new pelvic masses, LUQ and mid left abdomen metastases. In the ED, workup also showed urinary retention and abdominal pain was relieved by Foley catheter insertion. Urology was consulted. She is admitted for AKI due to obstructive uropathy. Cr increased to 5.13 today from 4.89 yesterday.   PMT has been consulted to assist with pain management and goals of care conversation. She faces complex medical decision making and anticipatory care needs.  Today's Discussion: Chart reviewed. Patient is on scheduled dilaudid  but did not receive two overnight doses due to being asleep-- added note to administration instructions do not hold for sleep. Patient awake but not responding verbally. She looked uncomfortable and was moaning- requested medication from nurse. Provided mouth care. No family at bedside.  PMT will continue to support.  Recommendations/Plan: Continue DNR/DNI Continue comfort focused care, no adjustments required Dilaudid  1mg  IV scheduled Q4H and 2mg  IV PRN Q15min for pain/air hunger/comfort Robinul PRN for excessive secretions Ativan  PRN for  agitation/anxiety Zofran  PRN for nausea Liquifilm tears PRN for dry eyes May have comfort feeding Comfort cart for family Unrestricted visitations in the setting of EOL (per policy) Oxygen PRN 2L or less for comfort. No escalation.   Family has declined hospice facility Psychosocial and emotional support provided PMT will continue to follow and support  Goals of Care and Additional Recommendations: Limitations on Scope of Treatment: Full Comfort Care  Code Status:    Code Status Orders  (From admission, onward)           Start     Ordered   07/19/23 0925  Do not attempt resuscitation (DNR) - Comfort care  Continuous       Question Answer Comment  If patient has no pulse and is not breathing Do Not Attempt Resuscitation   In Pre-Arrest Conditions (Patient Is Breathing and Has a Pulse) Provide comfort measures. Relieve any mechanical airway obstruction. Avoid transfer unless required for comfort.   Consent: Discussion documented in EHR or advanced directives reviewed      07/19/23 0929        Extensive chart review has been completed prior to seeing the patient including progress/consult notes, orders, medications, and available advance directive documents.  Prognosis:  < 2 weeks  Discharge Planning: Anticipated Hospital Death  Care plan was discussed with bedside RN  Time spent: 25 minutes  Thank you for allowing the Palliative Medicine Team to assist in the care of this patient.   Stephane CHRISTELLA Palin, NP  Please contact Palliative Medicine Team phone at [phone] for questions and concerns.

## 2023-07-25 DIAGNOSIS — N139 Obstructive and reflux uropathy, unspecified: Secondary | ICD-10-CM | POA: Diagnosis not present

## 2023-07-25 DIAGNOSIS — D499 Neoplasm of unspecified behavior of unspecified site: Secondary | ICD-10-CM | POA: Diagnosis not present

## 2023-07-25 DIAGNOSIS — D509 Iron deficiency anemia, unspecified: Secondary | ICD-10-CM | POA: Diagnosis not present

## 2023-07-25 DIAGNOSIS — R0609 Other forms of dyspnea: Secondary | ICD-10-CM | POA: Diagnosis not present

## 2023-07-25 DIAGNOSIS — Z515 Encounter for palliative care: Secondary | ICD-10-CM | POA: Diagnosis not present

## 2023-07-25 MED ORDER — SODIUM CHLORIDE 0.9% FLUSH: 10.0000 mL | INTRAVENOUS | Status: DC | PRN

## 2023-07-25 NOTE — Hospital Course (Signed)
 63 y.o. 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 Initial plan was percutaneous nephrostomy tube per urology but patient seen by oncology on 6/16 and deemed likely terminal from cancer standpoint due to spread of disease.  Currently patient alert does not appear to be in imminent hospital death but family is also refusing residential hospice.    Assessment & Plan: Advanced carcinoma:  Currently on comfort care.  Pain is under control.  In and out of responsiveness as per the family.  AKI:  Initial reason for admission.   Currently on comfort care   Diabetes:  Liberalize diet   Pancytopenia -Comfort care measures   Constipation: - Continue laxatives as tolerated.  Liberalize pain regimen and continue to watch for any worsening constipation

## 2023-07-25 NOTE — Progress Notes (Signed)
 PROGRESS NOTE  Danielle Mullins FMW:[id number] DOB: 04-Jan-1961 DOA: 07/17/2023 PCP: Sebastian Jerilyn HERO, FNP   LOS: 8 days   Brief narrative:  63 y.o. 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 Initial plan was percutaneous nephrostomy tube per urology but patient seen by oncology on 6/16 and deemed likely terminal from cancer standpoint due to spread of disease.  Currently patient alert does not appear to be in imminent hospital death but family is also refusing residential hospice.     Assessment/Plan: Principal Problem:   Obstructive uropathy Active Problems:   Diabetes mellitus without complication (HCC)   Hypertension   Acute renal failure (HCC)   Endometrial cancer (HCC)   Microcytic anemia   Hypokalemia  Advanced carcinoma:  Currently on comfort care.  Pain is under control.  In and out of responsiveness as per the family.  AKI:  Initial reason for admission.   Currently on comfort care   Diabetes:  Liberalize diet   Pancytopenia -Comfort care measures   Constipation: - Continue laxatives as tolerated.  Liberalize pain regimen and continue to watch for any worsening constipation DVT prophylaxis:    Disposition: Comfort care.  Likely in-hospital death.  Family refusing residential hospice.  Status is: Inpatient Remains inpatient appropriate because: Comfort care    Code Status:     Code Status: Do not attempt resuscitation (DNR) - Comfort care  Family Communication: None at bedside  Consultants: Palliative care  Procedures: None  Anti-infectives:  None currently  Anti-infectives (From admission, onward)    Start     Dose/Rate Route Frequency Ordered Stop   07/17/23 2030  cefTRIAXone  (ROCEPHIN ) 1 g in sodium chloride  0.9 % 100 mL IVPB   Status:  Discontinued        1 g 200 mL/hr over 30 Minutes Intravenous Every 24 hours 07/17/23 1943 07/19/23 0929        Subjective: Today, patient was seen and examined at bedside.  Appears to be lethargic but comfortable.  Objective: Vitals:   07/24/23 1936 07/25/23 0901  BP: 106/63 (!) 88/56  Pulse: 100 (!) 107  Resp: 16 (!) 6  Temp: 97.6 F (36.4 C) 98.2 F (36.8 C)  SpO2: 100% 99%    Intake/Output Summary (Last 24 hours) at 07/25/2023 1100 Last data filed at 07/24/2023 1720 Gross per 24 hour  Intake 100 ml  Output --  Net 100 ml   Filed Weights   07/17/23 1425  Weight: 77 kg   Body mass index is 27.4 kg/m.   Physical Exam: GENERAL: Patient is lethargic but appears comfortable. NECK: is supple, no gross swelling noted. CHEST: Decreased breath sounds.  Deep breathing noted. CVS: S1 and S2 heard, no murmur. Regular rate and rhythm.  ABDOMEN: Soft,  EXTREMITIES: No edema. CNS: Not responsive.  Lethargic SKIN: warm and dry without rashes.  Data Review: I have personally reviewed the following laboratory data and studies,  CBC: Recent Labs  Lab 07/19/23 0836  WBC 8.5  HGB 7.4*  HCT 23.9*  MCV 87.2  PLT 180   Basic Metabolic Panel: Recent Labs  Lab 07/19/23 0836  NA 142  K 3.8  CL 114*  CO2 11*  GLUCOSE 149*  BUN 77*  CREATININE 6.42*  CALCIUM 8.8*   Liver Function Tests: No results for input(s): AST, ALT, ALKPHOS, BILITOT, PROT,  ALBUMIN in the last 168 hours. No results for input(s): LIPASE, AMYLASE in the last 168 hours. No results for input(s): AMMONIA in the last 168 hours. Cardiac Enzymes: No results for input(s): CKTOTAL, CKMB, CKMBINDEX, TROPONINI in the last 168 hours. BNP (last 3 results) No results for input(s): BNP in the last 8760 hours.  ProBNP (last 3 results) No results for input(s): PROBNP in the last 8760 hours.  CBG: Recent Labs  Lab 07/18/23 1647 07/18/23 2211 07/19/23 0628  07/19/23 0814 07/19/23 2132  GLUCAP 109* 121* 125* 138* 148*   No results found for this or any previous visit (from the past 240 hours).   Studies: No results found.    Ayush Boulet, MD  [facility] 07/25/2023  If 7PM-7AM, please contact night-coverage

## 2023-07-25 NOTE — Plan of Care (Signed)
  Problem: Education: Goal: Knowledge of General Education information will improve Description: Including pain rating scale, medication(s)/side effects and non-pharmacologic comfort measures Outcome: Not Progressing   Problem: Health Behavior/Discharge Planning: Goal: Ability to manage health-related needs will improve Outcome: Not Progressing   Problem: Clinical Measurements: Goal: Ability to maintain clinical measurements within normal limits will improve Outcome: Not Progressing Goal: Will remain free from infection Outcome: Not Progressing Goal: Diagnostic test results will improve Outcome: Not Progressing Goal: Respiratory complications will improve Outcome: Not Progressing Goal: Cardiovascular complication will be avoided Outcome: Not Progressing   Problem: Activity: Goal: Risk for activity intolerance will decrease Outcome: Not Progressing   Problem: Nutrition: Goal: Adequate nutrition will be maintained Outcome: Not Progressing   Problem: Coping: Goal: Level of anxiety will decrease Outcome: Not Progressing   Problem: Elimination: Goal: Will not experience complications related to bowel motility Outcome: Not Progressing Goal: Will not experience complications related to urinary retention Outcome: Not Progressing   Problem: Pain Managment: Goal: General experience of comfort will improve and/or be controlled Outcome: Not Progressing   Problem: Safety: Goal: Ability to remain free from injury will improve Outcome: Not Progressing   Problem: Skin Integrity: Goal: Risk for impaired skin integrity will decrease Outcome: Not Progressing   Problem: Education: Goal: Ability to describe self-care measures that may prevent or decrease complications (Diabetes Survival Skills Education) will improve Outcome: Not Progressing Goal: Individualized Educational Video(s) Outcome: Not Progressing   Problem: Coping: Goal: Ability to adjust to condition or change in  health will improve Outcome: Not Progressing   Problem: Fluid Volume: Goal: Ability to maintain a balanced intake and output will improve Outcome: Not Progressing   Problem: Health Behavior/Discharge Planning: Goal: Ability to identify and utilize available resources and services will improve Outcome: Not Progressing Goal: Ability to manage health-related needs will improve Outcome: Not Progressing   Problem: Metabolic: Goal: Ability to maintain appropriate glucose levels will improve Outcome: Not Progressing   Problem: Nutritional: Goal: Maintenance of adequate nutrition will improve Outcome: Not Progressing Goal: Progress toward achieving an optimal weight will improve Outcome: Not Progressing   Problem: Skin Integrity: Goal: Risk for impaired skin integrity will decrease Outcome: Not Progressing   Problem: Tissue Perfusion: Goal: Adequacy of tissue perfusion will improve Outcome: Not Progressing   Problem: Education: Goal: Knowledge of the prescribed therapeutic regimen will improve Outcome: Not Progressing   Problem: Coping: Goal: Ability to identify and develop effective coping behavior will improve Outcome: Not Progressing   Problem: Clinical Measurements: Goal: Quality of life will improve Outcome: Not Progressing   Problem: Respiratory: Goal: Verbalizations of increased ease of respirations will increase Outcome: Not Progressing   Problem: Role Relationship: Goal: Family's ability to cope with current situation will improve Outcome: Not Progressing Goal: Ability to verbalize concerns, feelings, and thoughts to partner or family member will improve Outcome: Not Progressing   Problem: Pain Management: Goal: Satisfaction with pain management regimen will improve Outcome: Not Progressing

## 2023-07-25 NOTE — Progress Notes (Signed)
 Patient ID: Danielle Mullins, female   DOB: 1960/09/25, 63 y.o.   MRN: [id number]    Progress Note from the Palliative Medicine Team at [facility]   Patient Name: Danielle Mullins        Date: 07/25/2023 DOB: 05-24-1960  Age: 63 y.o. MRN#: [id number] Attending Physician: Sonjia Held, MD Primary Care Physician: Sebastian Jerilyn HERO, FNP Admit Date: 07/17/2023   Reason for Consultation/Follow-up   Establishing Goals of Care   HPI/ Brief Hospital Review  63 y.o. 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    Patient recently completed chemotherapy and also had complications with side effects of chemotherapy, including an allergic reaction and severe anemia. Her cancer has progressed despite this; CT showed multiple new pelvic masses, LUQ and mid left abdomen metastases. In the ED, workup also showed urinary retention and abdominal pain was relieved by Foley catheter insertion. Urology was consulted. She is admitted for AKI due to obstructive uropathy. Cr increased to 5.13 today from 4.89 yesterday.   PMT has been consulted to assist with pain management and goals of care conversation. She faces complex medical decision making and anticipatory care needs.   Subjective  Extensive chart review has been completed prior to meeting with patient/family  including labs, vital signs, imaging, progress/consult notes, orders, medications and available advance directive documents.    This NP assessed patient at the bedside as a follow up to  yesterday's GOCs meeting.  Brother at bedside        Education offered today regarding  the importance of continued conversation with family and their  medical providers regarding overall plan of care and treatment options,  ensuring decisions are within the context of the patients values and  GOCs.  Questions and concerns addressed   Discussed with primary team and nursing staff   Time:  25 minutes  Detailed review of medical records ( labs, imaging, vital signs), medically appropriate exam ( MS, skin, cardiac,  resp)   discussed with treatment team, counseling and education to patient, family, staff, documenting clinical information, medication management, coordination of care    Ronal Plants NP  Palliative Medicine Team Team Phone # [phone] Pager [phone]

## 2023-07-25 NOTE — Plan of Care (Signed)

## 2023-07-25 NOTE — TOC Progression Note (Signed)
 Transition of Care [facility]) - Progression Note    Patient Details  Name: Danielle Mullins MRN: [id number] Date of Birth: Aug 24, 1960  [facility]) CM/SW Contact  Rosaline JONELLE Joe, RN Phone Number: 07/25/2023, 3:52 PM  Clinical Narrative:    CM noted that patient will remain inpatient in the hospital and hospital death is expected.  Patient will remain inpatient since patient/family decline Inpatient Hospice placemen.        Expected Discharge Plan and Services       Living arrangements for the past 2 months: Apartment                                       Social Determinants of Health (SDOH) Interventions SDOH Screenings   Food Insecurity: No Food Insecurity (07/17/2023)  Housing: Low Risk  (07/17/2023)  Transportation Needs: No Transportation Needs (07/17/2023)  Utilities: Not At Risk (07/17/2023)  Depression (PHQ2-9): Low Risk  (02/28/2023)  Financial Resource Strain: High Risk (03/31/2023)  Tobacco Use: Low Risk  (07/17/2023)    Readmission Risk Interventions    07/19/2023    1:33 PM  Readmission Risk Prevention Plan  Transportation Screening Complete  PCP or Specialist Appt within 3-5 Days Complete  HRI or Home Care Consult Complete  Social Work Consult for Recovery Care Planning/Counseling Complete  Palliative Care Screening Complete  Medication Review [employer]) Complete

## 2023-07-26 DIAGNOSIS — R0609 Other forms of dyspnea: Secondary | ICD-10-CM

## 2023-07-26 DIAGNOSIS — D499 Neoplasm of unspecified behavior of unspecified site: Secondary | ICD-10-CM

## 2023-07-26 DIAGNOSIS — D509 Iron deficiency anemia, unspecified: Secondary | ICD-10-CM | POA: Diagnosis not present

## 2023-07-26 DIAGNOSIS — Z515 Encounter for palliative care: Secondary | ICD-10-CM | POA: Diagnosis not present

## 2023-07-26 MED ORDER — STERILE WATER FOR INJECTION IJ SOLN
INTRAMUSCULAR | Status: AC
  Filled 2023-07-26: qty 10

## 2023-07-26 NOTE — Plan of Care (Signed)

## 2023-07-26 NOTE — Progress Notes (Signed)
 PROGRESS NOTE  Danielle Mullins FMW:[id number] DOB: 1960/06/02 DOA: 07/17/2023 PCP: Sebastian Jerilyn HERO, FNP   LOS: 9 days   Brief narrative:  63 y.o. 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 Initial plan was percutaneous nephrostomy tube per urology but patient seen by oncology on 6/16 and deemed likely terminal from cancer standpoint due to spread of disease.  Currently patient alert does not appear to be in imminent hospital death but family is also refusing residential hospice.    Assessment/Plan: Principal Problem:   Obstructive uropathy Active Problems:   Diabetes mellitus without complication (HCC)   Hypertension   Acute renal failure (HCC)   Endometrial cancer (HCC)   Microcytic anemia   Hypokalemia  Advanced carcinoma:  Currently on comfort care.  Pain is under control.  In and out of responsiveness as per the family.  Family wishing in-hospital death  AKI:  Initial reason for admission.   Currently on comfort care   Diabetes:  Liberalize diet   Pancytopenia -Comfort care measures   Constipation: - Continue laxatives.  DVT prophylaxis:    Disposition: Comfort care.  Likely in-hospital death.  Family refusing residential hospice.  Status is: Inpatient Remains inpatient appropriate because: Comfort care    Code Status:     Code Status: Do not attempt resuscitation (DNR) - Comfort care  Family Communication: Spoke with the patient's brother at bedside  Consultants: Palliative care  Procedures: None  Anti-infectives:  None currently  Anti-infectives (From admission, onward)    Start     Dose/Rate Route Frequency Ordered Stop   07/17/23 2030  cefTRIAXone  (ROCEPHIN ) 1 g in sodium chloride  0.9 % 100 mL IVPB  Status:  Discontinued        1  g 200 mL/hr over 30 Minutes Intravenous Every 24 hours 07/17/23 1943 07/19/23 0929       Subjective: Today, patient was seen and examined at bedside.  Patient appears lethargic with long noisy deep breathing..  No interval complaints reported.  Objective: Vitals:   07/25/23 0901 07/26/23 0804  BP: (!) 88/56 (!) 82/43  Pulse: (!) 107 66  Resp: (!) 6 15  Temp: 98.2 F (36.8 C) 98.1 F (36.7 C)  SpO2: 99% (!) 80%    Intake/Output Summary (Last 24 hours) at 07/26/2023 0949 Last data filed at 07/26/2023 0500 Gross per 24 hour  Intake --  Output 250 ml  Net -250 ml   Filed Weights   07/17/23 1425  Weight: 77 kg   Body mass index is 27.4 kg/m.   Physical Exam:  GENERAL: Patient is lethargic but appears comfortable. NECK: is supple, no gross swelling noted. CHEST: Decreased breath sounds.  Deep breathing  CVS: S1 and S2 heard, no murmur. Regular rate and rhythm.  ABDOMEN: Soft,  EXTREMITIES: No edema. CNS: Not responsive.  Lethargic   Data Review: I have personally reviewed the following laboratory data and studies,  CBC: No results for input(s): WBC, NEUTROABS, HGB, HCT, MCV, PLT in the last 168 hours.  Basic Metabolic Panel: No results for input(s): NA, K, CL, CO2, GLUCOSE, BUN, CREATININE, CALCIUM, MG, PHOS in the last 168 hours.  Liver Function Tests: No results for input(s): AST, ALT, ALKPHOS, BILITOT, PROT, ALBUMIN in the last 168 hours. No results for input(s): LIPASE, AMYLASE in the last 168 hours. No results for  input(s): AMMONIA in the last 168 hours. Cardiac Enzymes: No results for input(s): CKTOTAL, CKMB, CKMBINDEX, TROPONINI in the last 168 hours. BNP (last 3 results) No results for input(s): BNP in the last 8760 hours.  ProBNP (last 3 results) No results for input(s): PROBNP in the last 8760 hours.  CBG: Recent Labs  Lab 07/19/23 2132  GLUCAP 148*   No results found for this or  any previous visit (from the past 240 hours).   Studies: No results found.    Danielle Welke, MD  [facility] 07/26/2023  If 7PM-7AM, please contact night-coverage

## 2023-07-26 NOTE — Plan of Care (Signed)
  Problem: Education: Goal: Knowledge of General Education information will improve Description: Including pain rating scale, medication(s)/side effects and non-pharmacologic comfort measures Outcome: Not Progressing   Problem: Health Behavior/Discharge Planning: Goal: Ability to manage health-related needs will improve Outcome: Not Progressing   Problem: Clinical Measurements: Goal: Ability to maintain clinical measurements within normal limits will improve Outcome: Not Progressing Goal: Will remain free from infection Outcome: Not Progressing Goal: Diagnostic test results will improve Outcome: Not Progressing Goal: Respiratory complications will improve Outcome: Not Progressing Goal: Cardiovascular complication will be avoided Outcome: Not Progressing   Problem: Activity: Goal: Risk for activity intolerance will decrease Outcome: Not Progressing   Problem: Nutrition: Goal: Adequate nutrition will be maintained Outcome: Not Progressing   Problem: Coping: Goal: Level of anxiety will decrease Outcome: Not Progressing   Problem: Elimination: Goal: Will not experience complications related to bowel motility Outcome: Not Progressing Goal: Will not experience complications related to urinary retention Outcome: Not Progressing   Problem: Pain Managment: Goal: General experience of comfort will improve and/or be controlled Outcome: Not Progressing   Problem: Safety: Goal: Ability to remain free from injury will improve Outcome: Not Progressing   Problem: Skin Integrity: Goal: Risk for impaired skin integrity will decrease Outcome: Not Progressing   Problem: Education: Goal: Ability to describe self-care measures that may prevent or decrease complications (Diabetes Survival Skills Education) will improve Outcome: Not Progressing Goal: Individualized Educational Video(s) Outcome: Not Progressing   Problem: Coping: Goal: Ability to adjust to condition or change in  health will improve Outcome: Not Progressing   Problem: Fluid Volume: Goal: Ability to maintain a balanced intake and output will improve Outcome: Not Progressing   Problem: Health Behavior/Discharge Planning: Goal: Ability to identify and utilize available resources and services will improve Outcome: Not Progressing Goal: Ability to manage health-related needs will improve Outcome: Not Progressing   Problem: Metabolic: Goal: Ability to maintain appropriate glucose levels will improve Outcome: Not Progressing   Problem: Nutritional: Goal: Maintenance of adequate nutrition will improve Outcome: Not Progressing Goal: Progress toward achieving an optimal weight will improve Outcome: Not Progressing   Problem: Skin Integrity: Goal: Risk for impaired skin integrity will decrease Outcome: Not Progressing   Problem: Tissue Perfusion: Goal: Adequacy of tissue perfusion will improve Outcome: Not Progressing   Problem: Education: Goal: Knowledge of the prescribed therapeutic regimen will improve Outcome: Not Progressing   Problem: Coping: Goal: Ability to identify and develop effective coping behavior will improve Outcome: Not Progressing   Problem: Clinical Measurements: Goal: Quality of life will improve Outcome: Not Progressing   Problem: Respiratory: Goal: Verbalizations of increased ease of respirations will increase Outcome: Not Progressing   Problem: Role Relationship: Goal: Family's ability to cope with current situation will improve Outcome: Not Progressing Goal: Ability to verbalize concerns, feelings, and thoughts to partner or family member will improve Outcome: Not Progressing   Problem: Pain Management: Goal: Satisfaction with pain management regimen will improve Outcome: Not Progressing

## 2023-07-27 DIAGNOSIS — N139 Obstructive and reflux uropathy, unspecified: Secondary | ICD-10-CM | POA: Diagnosis not present

## 2023-08-02 NOTE — Death Summary Note (Signed)
 DEATH SUMMARY   Patient Details  Name: Danielle Mullins MRN: [id number] DOB: 11/20/1960 ERE:Uynfednw, Jerilyn HERO, FNP Admission/Discharge Information   Admit Date:  Jul 26, 2023  Date of Death: Date of Death: 2023-08-05  Time of Death: Time of Death: 0650  Length of Stay: 2023-07-21   Principle Cause of death: metastatic carcinosarcoma   Hospital Diagnoses: Principal Problem:   Obstructive uropathy Active Problems:   Diabetes mellitus without complication (HCC)   Hypertension   Acute renal failure (HCC)   Endometrial cancer (HCC)   Microcytic anemia   Hypokalemia   Hospital Course: 63 y.o. 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 Initial plan was percutaneous nephrostomy tube per urology but patient seen by oncology on 6/16 and deemed likely terminal from cancer standpoint due to spread of disease. Currently patient alert does not appear to be in imminent hospital death but family is also refusing residential hospice.   Assessment and Plan:  Advanced carcinoma:  AKI:  Diabetes:  Pancytopenia Constipation:   Disposition: Comfort care.  Family refusing residential hospice. Pt peacefully expired at 0650 on 08-05-23        Procedures: none  Consultations: palliative care  The results of significant diagnostics from this hospitalization (including imaging, microbiology, ancillary and laboratory) are listed below for reference.   Significant Diagnostic Studies: CT ABDOMEN PELVIS WO CONTRAST Result Date: 26-Jul-2023 CLINICAL DATA:  Abdominal pain, acute, nonlocalized. EXAM: CT ABDOMEN AND PELVIS WITHOUT CONTRAST TECHNIQUE: Multidetector CT imaging of the abdomen and pelvis was performed following the standard protocol without IV contrast. RADIATION DOSE REDUCTION: This  exam was performed according to the departmental dose-optimization program which includes automated exposure control, adjustment of the mA and/or kV according to patient size and/or use of iterative reconstruction technique. COMPARISON:  01/27/2023. FINDINGS: Lower chest: No acute abnormality. Hepatobiliary: A subcentimeter hypodensity is noted in the left lobe of the liver which is too small to further characterize. No biliary ductal dilatation. The gallbladder is without stones. Pancreas: Unremarkable. No pancreatic ductal dilatation or surrounding inflammatory changes. Spleen: Normal in size without focal abnormality. Adrenals/Urinary Tract: The adrenal glands are within normal limits. A punctate calculus is noted in the mid right kidney. There is mild to moderate hydroureteronephrosis bilaterally to the level of a pelvic mass likely resulting in extrinsic compression. A Foley catheter is noted in the urinary bladder Stomach/Bowel: The stomach is within normal limits. No bowel obstruction, free air, or pneumatosis is seen. A moderate to large amount of retained stool is present in the colon. Normal appendix is identified in the right lower quadrant. Vascular/Lymphatic: Aortic atherosclerosis. No lymphadenopathy is seen. Reproductive: There is heterogeneous enlargement of the uterus measuring 13.9 x 7.5 x 11.2 cm. No masses are identified in the abdomen and pelvis measuring 6.9 and 5.0 cm in the left lower quadrant, 6.8 cm in the mid left abdomen, and a 0.6 cm in the left upper quadrant posterior to the spleen. Other: No abdominopelvic ascites. Musculoskeletal: Degenerative changes are present in the thoracolumbar spine. No acute osseous abnormality is seen. IMPRESSION: 1. Enlarged heterogeneous uterus, compatible with known leiomyosarcoma/endometrial cancer. Multiple new masses are identified in the pelvis, left upper quadrant, and mid left abdomen, suspicious for metastatic disease. 2. Mild to moderate bilateral  hydroureteronephrosis to the level of the pelvic mass and likely resulting from extrinsic compression.  3. Moderate to large amount of retained stool in the colon. 4. Aortic atherosclerosis. Electronically Signed   By: Leita Birmingham M.D.   On: 07/17/2023 17:08    Microbiology: No results found for this or any previous visit (from the past 240 hours).  Time spent: 20 minutes  Signed: Carliss LELON Canales, DO 07/16/2023

## 2023-08-02 NOTE — Plan of Care (Signed)
 Problem: Education: Goal: Knowledge of General Education information will improve Description: Including pain rating scale, medication(s)/side effects and non-pharmacologic comfort measures 07/12/2023 0526 by Debby Camara, LPN Outcome: Not Progressing 07/24/2023 0520 by Debby Camara, LPN Outcome: Not Progressing   Problem: Health Behavior/Discharge Planning: Goal: Ability to manage health-related needs will improve 07/11/2023 0526 by Debby Camara, LPN Outcome: Not Progressing 07/13/2023 0520 by Debby Camara, LPN Outcome: Not Progressing   Problem: Clinical Measurements: Goal: Ability to maintain clinical measurements within normal limits will improve 07/19/2023 0526 by Debby Camara, LPN Outcome: Not Progressing 07/05/2023 0520 by Debby Camara, LPN Outcome: Not Progressing Goal: Will remain free from infection 08/01/2023 0526 by Debby Camara, LPN Outcome: Not Progressing 07/09/2023 0520 by Debby Camara, LPN Outcome: Not Progressing Goal: Diagnostic test results will improve 07/30/2023 0526 by Debby Camara, LPN Outcome: Not Progressing 07/13/2023 0520 by Debby Camara, LPN Outcome: Not Progressing Goal: Respiratory complications will improve 07/16/2023 0526 by Debby Camara, LPN Outcome: Not Progressing 07/21/2023 0520 by Debby Camara, LPN Outcome: Not Progressing Goal: Cardiovascular complication will be avoided 07/23/2023 0526 by Debby Camara, LPN Outcome: Not Progressing 08/01/2023 0520 by Debby Camara, LPN Outcome: Not Progressing   Problem: Activity: Goal: Risk for activity intolerance will decrease 07/24/2023 0526 by Debby Camara, LPN Outcome: Not Progressing 07/30/2023 0520 by Debby Camara, LPN Outcome: Not Progressing   Problem: Nutrition: Goal: Adequate nutrition will be maintained 07/14/2023 0526 by Debby Camara, LPN Outcome: Not Progressing 07/26/2023 0520 by Debby Camara, LPN Outcome: Not Progressing   Problem: Coping: Goal: Level of anxiety will decrease 07/26/2023 0526 by Debby Camara, LPN Outcome: Not Progressing 07/24/2023 0520 by Debby Camara, LPN Outcome: Not Progressing   Problem: Elimination: Goal: Will not experience complications related to bowel motility 07/30/2023 0526 by Debby Camara, LPN Outcome: Not Progressing 08/01/2023 0520 by Debby Camara, LPN Outcome: Not Progressing Goal: Will not experience complications related to urinary retention 07/23/2023 0526 by Debby Camara, LPN Outcome: Not Progressing 07/13/2023 0520 by Debby Camara, LPN Outcome: Not Progressing   Problem: Pain Managment: Goal: General experience of comfort will improve and/or be controlled 07/28/2023 0526 by Debby Camara, LPN Outcome: Not Progressing 07/07/2023 0520 by Debby Camara, LPN Outcome: Not Progressing   Problem: Safety: Goal: Ability to remain free from injury will improve 07/13/2023 0526 by Debby Camara, LPN Outcome: Not Progressing 07/08/2023 0520 by Debby Camara, LPN Outcome: Not Progressing   Problem: Skin Integrity: Goal: Risk for impaired skin integrity will decrease 07/22/2023 0526 by Debby Camara, LPN Outcome: Not Progressing 07/28/2023 0520 by Debby Camara, LPN Outcome: Not Progressing   Problem: Education: Goal: Ability to describe self-care measures that may prevent or decrease complications (Diabetes Survival Skills Education) will improve 07/15/2023 0526 by Debby Camara, LPN Outcome: Not Progressing 07/09/2023 0520 by Debby Camara, LPN Outcome: Not Progressing Goal: Individualized Educational Video(s) 07/20/2023 0526 by Debby Camara, LPN Outcome: Not Progressing 07/31/2023 0520 by Debby Camara, LPN Outcome: Not Progressing   Problem: Coping: Goal: Ability to adjust to condition or change in health will improve 07/03/2023 0526 by Debby Camara, LPN Outcome: Not Progressing 07/19/2023 0520 by Debby Camara, LPN Outcome: Not Progressing   Problem: Fluid Volume: Goal: Ability to maintain a balanced intake and output will improve 07/29/2023 0526 by Debby Camara,  LPN Outcome: Not Progressing 07/22/2023 0520 by Debby Camara, LPN Outcome: Not Progressing   Problem: Health Behavior/Discharge Planning: Goal: Ability to identify and utilize available resources and services will improve 07/18/2023 0526 by Debby Camara, LPN Outcome: Not Progressing 07/03/2023 0520 by  Debby Camara, LPN Outcome: Not Progressing Goal: Ability to manage health-related needs will improve 07/10/2023 0526 by Debby Camara, LPN Outcome: Not Progressing 07/07/2023 0520 by Debby Camara, LPN Outcome: Not Progressing   Problem: Metabolic: Goal: Ability to maintain appropriate glucose levels will improve 07/10/2023 0526 by Debby Camara, LPN Outcome: Not Progressing 07/13/2023 0520 by Debby Camara, LPN Outcome: Not Progressing   Problem: Nutritional: Goal: Maintenance of adequate nutrition will improve 07/10/2023 0526 by Debby Camara, LPN Outcome: Not Progressing 07/09/2023 0520 by Debby Camara, LPN Outcome: Not Progressing Goal: Progress toward achieving an optimal weight will improve 07/15/2023 0526 by Debby Camara, LPN Outcome: Not Progressing 07/31/2023 0520 by Debby Camara, LPN Outcome: Not Progressing   Problem: Skin Integrity: Goal: Risk for impaired skin integrity will decrease 07/06/2023 0526 by Debby Camara, LPN Outcome: Not Progressing 08/01/2023 0520 by Debby Camara, LPN Outcome: Not Progressing   Problem: Tissue Perfusion: Goal: Adequacy of tissue perfusion will improve 07/05/2023 0526 by Debby Camara, LPN Outcome: Not Progressing 07/13/2023 0520 by Debby Camara, LPN Outcome: Not Progressing   Problem: Education: Goal: Knowledge of the prescribed therapeutic regimen will improve 07/30/2023 0526 by Debby Camara, LPN Outcome: Not Progressing 07/11/2023 0520 by Debby Camara, LPN Outcome: Not Progressing   Problem: Coping: Goal: Ability to identify and develop effective coping behavior will improve 07/03/2023 0526 by Debby Camara, LPN Outcome: Not Progressing 07/07/2023 0520 by  Debby Camara, LPN Outcome: Not Progressing   Problem: Clinical Measurements: Goal: Quality of life will improve 07/22/2023 0526 by Debby Camara, LPN Outcome: Not Progressing 07/04/2023 0520 by Debby Camara, LPN Outcome: Not Progressing   Problem: Respiratory: Goal: Verbalizations of increased ease of respirations will increase 07/06/2023 0526 by Debby Camara, LPN Outcome: Not Progressing 07/08/2023 0520 by Debby Camara, LPN Outcome: Not Progressing   Problem: Role Relationship: Goal: Family's ability to cope with current situation will improve 07/10/2023 0526 by Debby Camara, LPN Outcome: Not Progressing 07/25/2023 0520 by Debby Camara, LPN Outcome: Not Progressing Goal: Ability to verbalize concerns, feelings, and thoughts to partner or family member will improve 07/25/2023 0526 by Debby Camara, LPN Outcome: Not Progressing 07/05/2023 0520 by Debby Camara, LPN Outcome: Not Progressing   Problem: Pain Management: Goal: Satisfaction with pain management regimen will improve 07/29/2023 0526 by Debby Camara, LPN Outcome: Not Progressing 07/13/2023 0520 by Debby Camara, LPN Outcome: Not Progressing

## 2023-08-02 DEATH — deceased

## 2023-09-15 ENCOUNTER — Ambulatory Visit: Admitting: Gynecologic Oncology
